# Patient Record
Sex: Female | Born: 1960
Health system: Southern US, Community
[De-identification: ages and names within clinical notes are randomized; demographics above are authoritative.]

## PROBLEM LIST (undated history)

## (undated) DIAGNOSIS — E079 Disorder of thyroid, unspecified: Secondary | ICD-10-CM

## (undated) DIAGNOSIS — C4490 Unspecified malignant neoplasm of skin, unspecified: Secondary | ICD-10-CM

## (undated) DIAGNOSIS — G4700 Insomnia, unspecified: Secondary | ICD-10-CM

## (undated) DIAGNOSIS — I10 Essential (primary) hypertension: Secondary | ICD-10-CM

## (undated) DIAGNOSIS — E039 Hypothyroidism, unspecified: Secondary | ICD-10-CM

## (undated) DIAGNOSIS — M797 Fibromyalgia: Secondary | ICD-10-CM

## (undated) DIAGNOSIS — K589 Irritable bowel syndrome without diarrhea: Secondary | ICD-10-CM

## (undated) DIAGNOSIS — F32A Depression, unspecified: Secondary | ICD-10-CM

## (undated) DIAGNOSIS — F329 Major depressive disorder, single episode, unspecified: Secondary | ICD-10-CM

## (undated) DIAGNOSIS — IMO0002 Reserved for concepts with insufficient information to code with codable children: Secondary | ICD-10-CM

## (undated) DIAGNOSIS — R29810 Facial weakness: Secondary | ICD-10-CM

## (undated) DIAGNOSIS — G2581 Restless legs syndrome: Secondary | ICD-10-CM

## (undated) DIAGNOSIS — N189 Chronic kidney disease, unspecified: Secondary | ICD-10-CM

## (undated) DIAGNOSIS — M329 Systemic lupus erythematosus, unspecified: Secondary | ICD-10-CM

## (undated) DIAGNOSIS — E785 Hyperlipidemia, unspecified: Secondary | ICD-10-CM

## (undated) HISTORY — DX: Chronic kidney disease, unspecified: N18.9

## (undated) HISTORY — DX: Essential (primary) hypertension: I10

## (undated) HISTORY — PX: ABDOMINAL HYSTERECTOMY: SHX81

## (undated) HISTORY — PX: OTHER SURGICAL HISTORY: SHX169

## (undated) HISTORY — DX: Facial weakness: R29.810

## (undated) HISTORY — DX: Hypothyroidism, unspecified: E03.9

## (undated) HISTORY — DX: Restless legs syndrome: G25.81

## (undated) HISTORY — DX: Insomnia, unspecified: G47.00

## (undated) HISTORY — DX: Hyperlipidemia, unspecified: E78.5

## (undated) HISTORY — PX: APPENDECTOMY: SHX54

## (undated) HISTORY — DX: Irritable bowel syndrome, unspecified: K58.9

---

## 1995-03-16 HISTORY — PX: OTHER SURGICAL HISTORY: SHX169

## 1996-02-13 HISTORY — PX: APPENDECTOMY: SHX54

## 1998-10-09 ENCOUNTER — Inpatient Hospital Stay (HOSPITAL_COMMUNITY): Admission: EM | Admit: 1998-10-09 | Discharge: 1998-10-12 | Payer: Self-pay | Admitting: Psychiatry

## 1998-10-09 ENCOUNTER — Emergency Department (HOSPITAL_COMMUNITY): Admission: EM | Admit: 1998-10-09 | Discharge: 1998-10-09 | Payer: Self-pay

## 1999-07-13 ENCOUNTER — Other Ambulatory Visit: Admission: RE | Admit: 1999-07-13 | Discharge: 1999-07-13 | Payer: Self-pay | Admitting: Obstetrics and Gynecology

## 1999-09-24 ENCOUNTER — Inpatient Hospital Stay (HOSPITAL_COMMUNITY): Admission: EM | Admit: 1999-09-24 | Discharge: 1999-09-26 | Payer: Self-pay | Admitting: Psychiatry

## 2000-07-18 ENCOUNTER — Other Ambulatory Visit: Admission: RE | Admit: 2000-07-18 | Discharge: 2000-07-18 | Payer: Self-pay | Admitting: Obstetrics and Gynecology

## 2001-11-19 ENCOUNTER — Other Ambulatory Visit: Admission: RE | Admit: 2001-11-19 | Discharge: 2001-11-19 | Payer: Self-pay | Admitting: Obstetrics and Gynecology

## 2003-01-04 ENCOUNTER — Other Ambulatory Visit: Admission: RE | Admit: 2003-01-04 | Discharge: 2003-01-04 | Payer: Self-pay | Admitting: Obstetrics and Gynecology

## 2004-03-14 ENCOUNTER — Other Ambulatory Visit: Admission: RE | Admit: 2004-03-14 | Discharge: 2004-03-14 | Payer: Self-pay | Admitting: Obstetrics and Gynecology

## 2004-09-28 ENCOUNTER — Encounter: Admission: RE | Admit: 2004-09-28 | Discharge: 2004-09-28 | Payer: Self-pay | Admitting: Obstetrics and Gynecology

## 2005-05-29 ENCOUNTER — Encounter: Admission: RE | Admit: 2005-05-29 | Discharge: 2005-05-29 | Payer: Self-pay | Admitting: Obstetrics and Gynecology

## 2006-02-12 HISTORY — PX: ABDOMINAL HYSTERECTOMY: SHX81

## 2006-03-19 ENCOUNTER — Encounter: Admission: RE | Admit: 2006-03-19 | Discharge: 2006-03-19 | Payer: Self-pay | Admitting: Obstetrics and Gynecology

## 2006-10-11 ENCOUNTER — Ambulatory Visit (HOSPITAL_COMMUNITY): Admission: RE | Admit: 2006-10-11 | Discharge: 2006-10-11 | Payer: Self-pay | Admitting: Family Medicine

## 2006-10-11 IMAGING — US US TRANSVAGINAL NON-OB
1 series · 14 of 25 positions shown · non-contrast
Comparison: None.

CLINICAL DATA: Left ovarian cyst, pelvic pain. 
 TRANSABDOMINAL AND TRANSVAGINAL PELVIC ULTRASOUND:
TECHNIQUE: Both transabdominal and transvaginal ultrasound examinations of the pelvis were performed including evaluation of the uterus, ovaries, adnexal regions, and pelvic cul-de-sac.

[Series 1: us transvaginal non-ob · 0.26mm/px · 62 acquisitions, 14 frames shown]
[im 1/62]
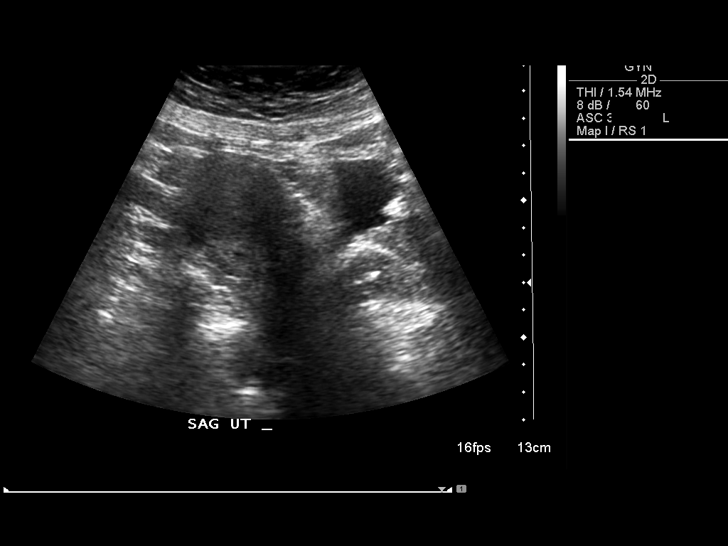
[im 6/62]
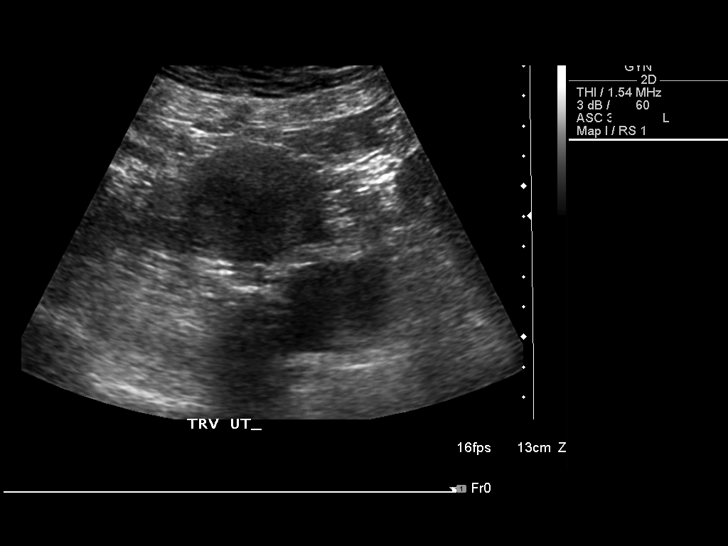
[im 11/62]
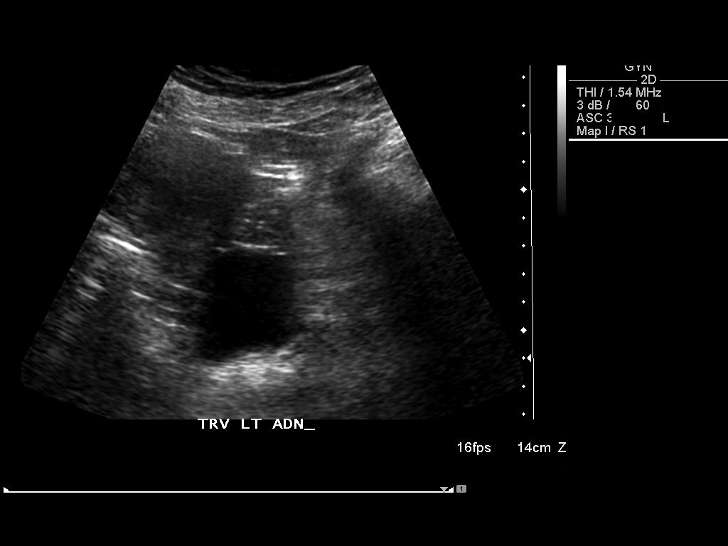
[im 16/62]
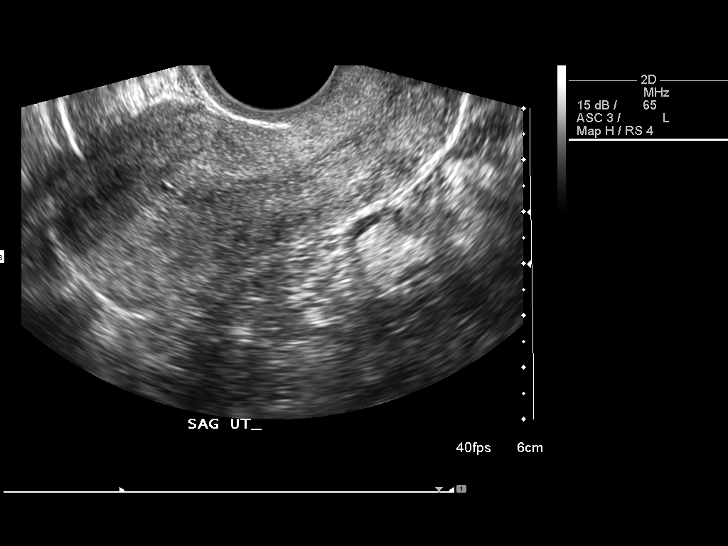
[im 21/62]
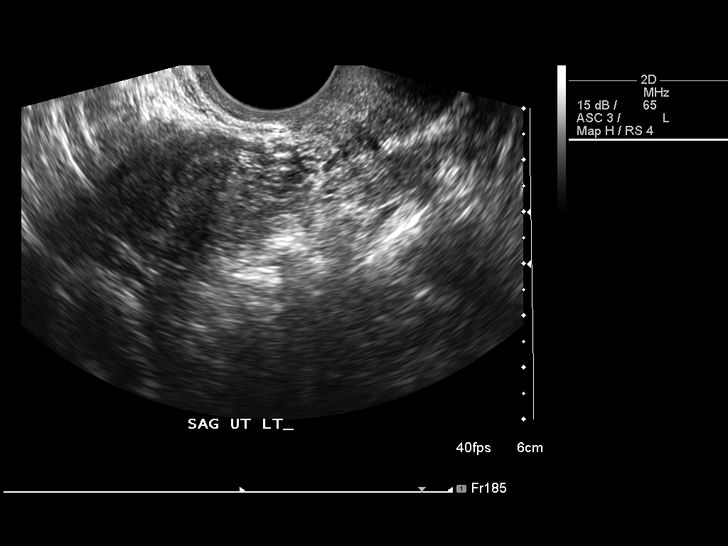
[im 23/62]
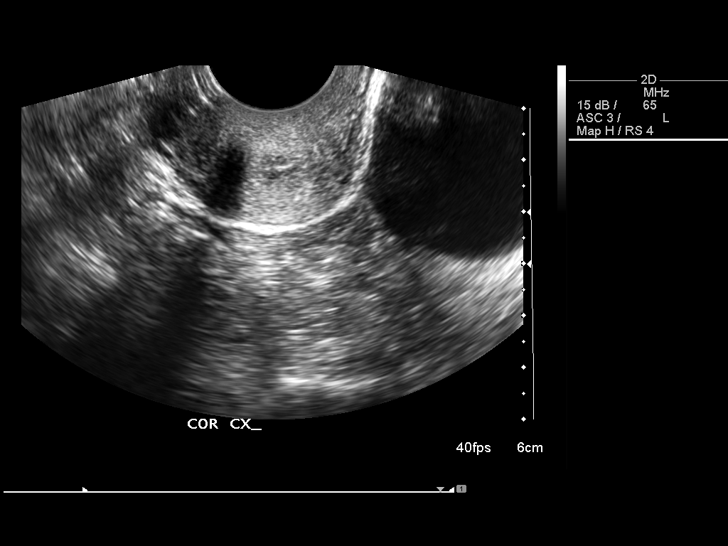
[im 28/62]
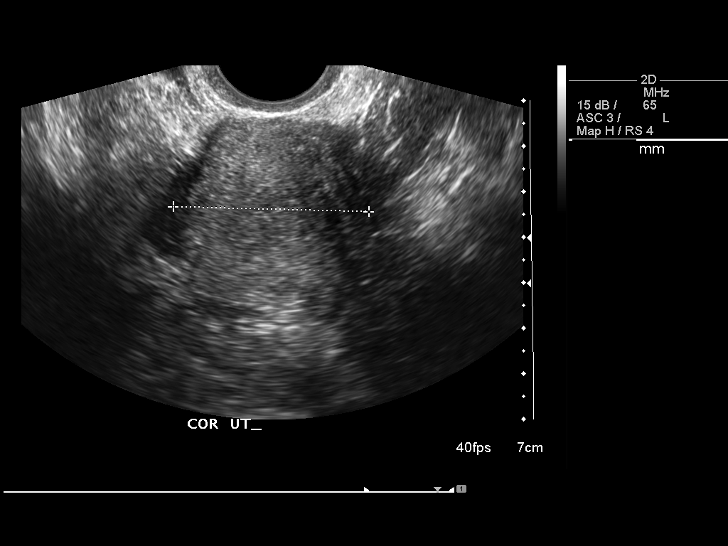
[im 34/62]
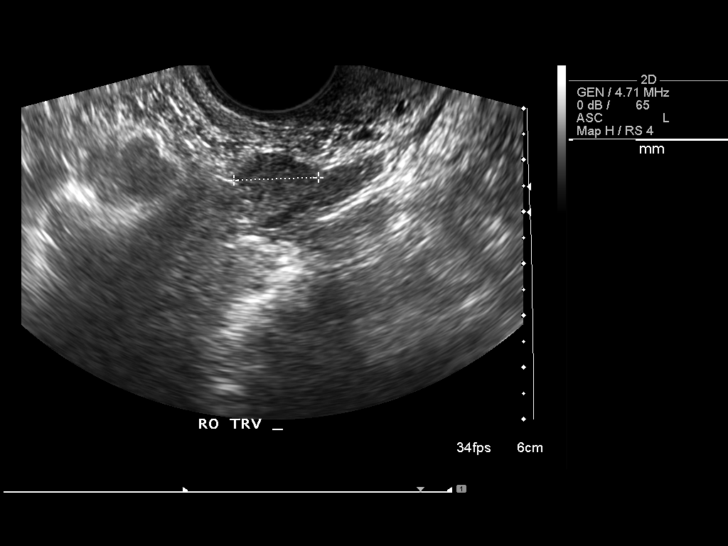
[im 39/62]
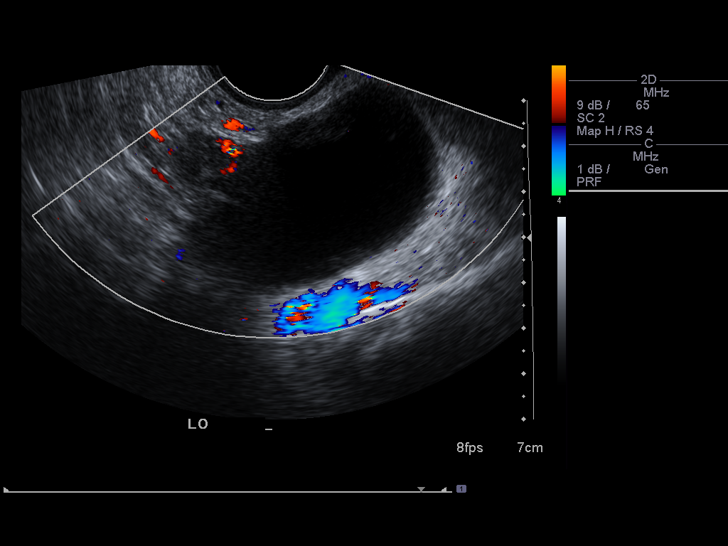
[im 41/62]
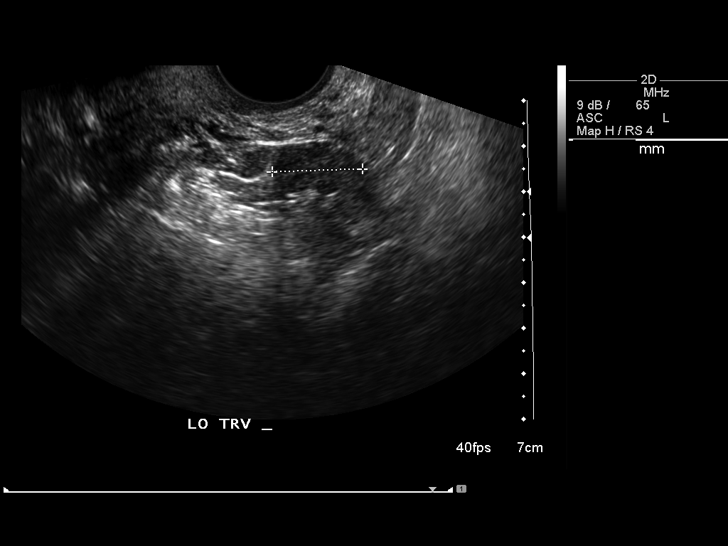
[im 46/62]
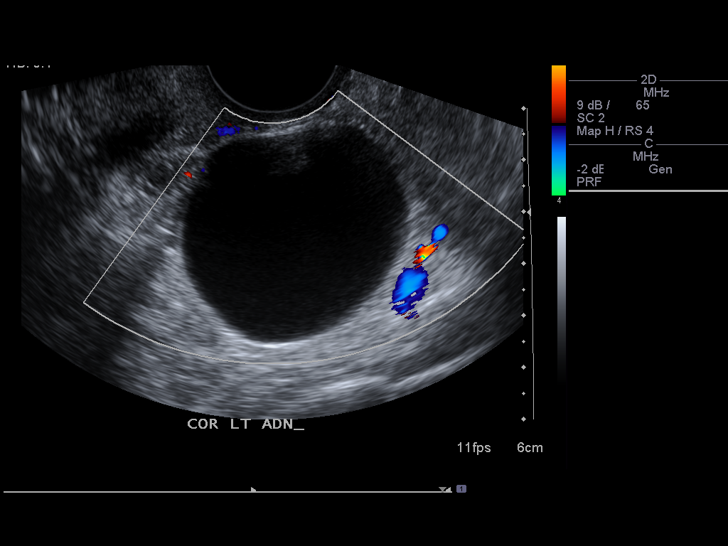
[im 51/62]
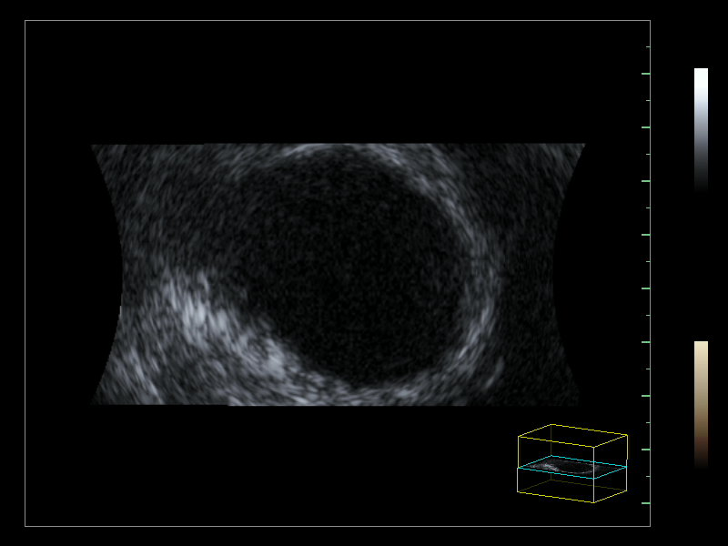
[im 56/62]
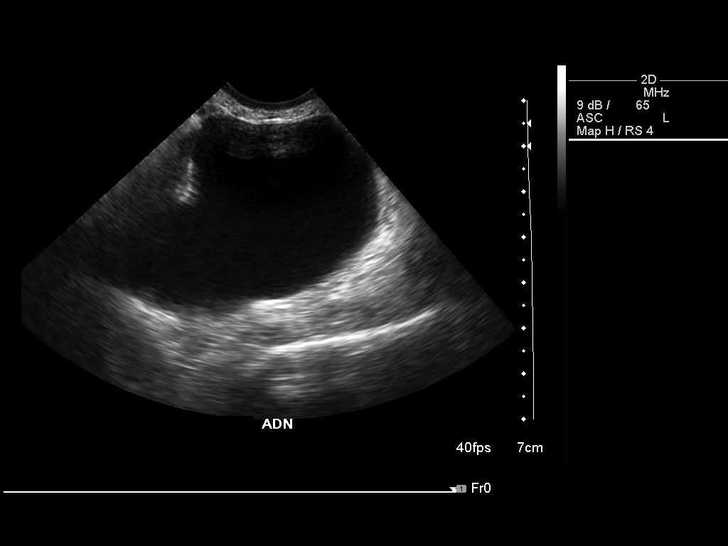
[im 62/62]
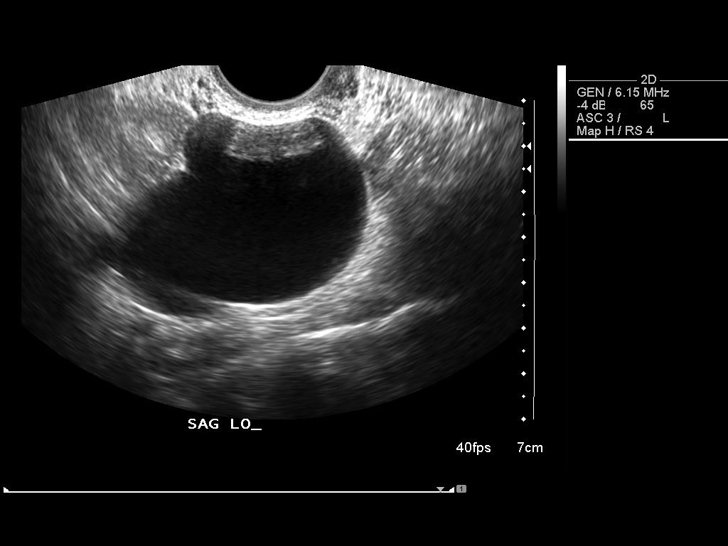

[14 of 25 positions shown; findings below may reference images not displayed]

FINDINGS: The uterus is unremarkable. Endometrial stripe measures 7 mm and is uniformly echogenic. Right ovary is normal. The left ovary contains a simple-appearing cyst measuring 5.5 x 4.4 x 3.8 cm. No free fluid.
IMPRESSION: Simple-appearing left ovarian cyst.  By size criteria, this cyst could predispose to ovarian torsion. Followup is suggested if resection is not planned.

## 2006-11-19 ENCOUNTER — Encounter (INDEPENDENT_AMBULATORY_CARE_PROVIDER_SITE_OTHER): Payer: Self-pay | Admitting: Obstetrics and Gynecology

## 2006-11-20 ENCOUNTER — Inpatient Hospital Stay (HOSPITAL_COMMUNITY): Admission: RE | Admit: 2006-11-20 | Discharge: 2006-11-21 | Payer: Self-pay | Admitting: Obstetrics and Gynecology

## 2007-04-08 ENCOUNTER — Encounter: Admission: RE | Admit: 2007-04-08 | Discharge: 2007-04-08 | Payer: Self-pay | Admitting: Obstetrics and Gynecology

## 2008-05-03 ENCOUNTER — Ambulatory Visit (HOSPITAL_COMMUNITY): Admission: RE | Admit: 2008-05-03 | Discharge: 2008-05-03 | Payer: Self-pay | Admitting: Obstetrics and Gynecology

## 2008-05-03 IMAGING — CT CT ABDOMEN W/ CM
3 of 5 series · 15 of 32 positions shown, 19 images · IV contrast (40ML OMNI-MIX & [ID] OMNIP 300%)
Comparison: None

CT ABDOMEN

CLINICAL DATA: Severe right lower quadrant pain and pelvic pain.
Previous vaginal hysterectomy.

CT ABDOMEN AND PELVIS WITH CONTRAST
TECHNIQUE: Multidetector CT imaging of the abdomen and pelvis was
performed using the standard protocol following bolus
administration of intravenous contrast.
Contrast: 100 ml [0P] and oral contrast

[Series 2: abd pelvis · axial · 0.69mm/px · z∈[-415,-195]mm · 3 of 87 slices shown, 7 images]
[im 22/87  soft-tissue]
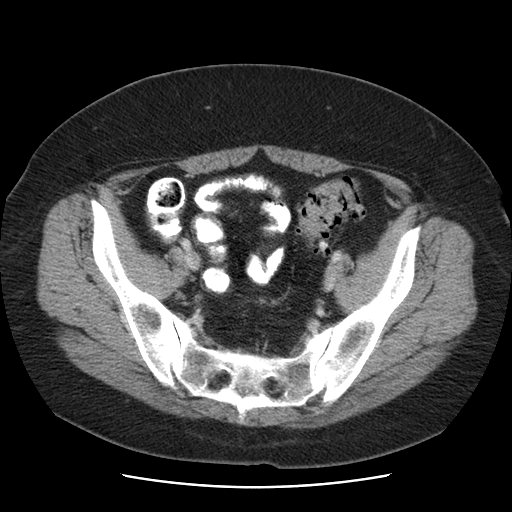
[im 22/87  lung]
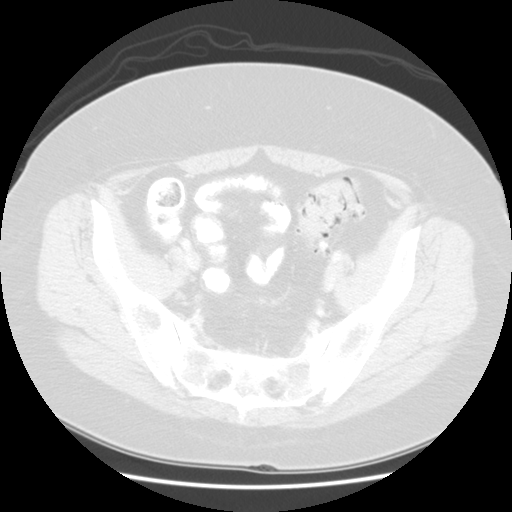
[im 22/87  bone]
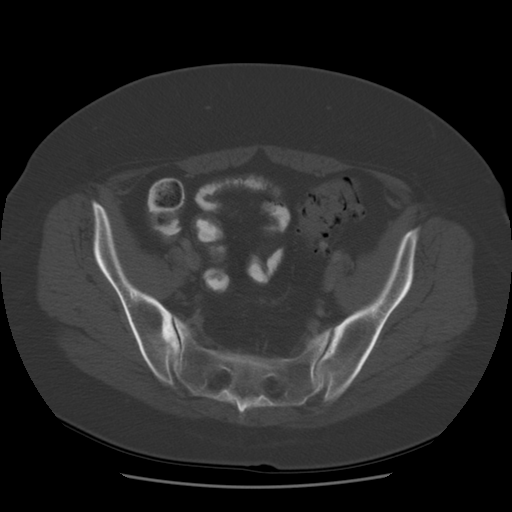
[im 44/87  soft-tissue]
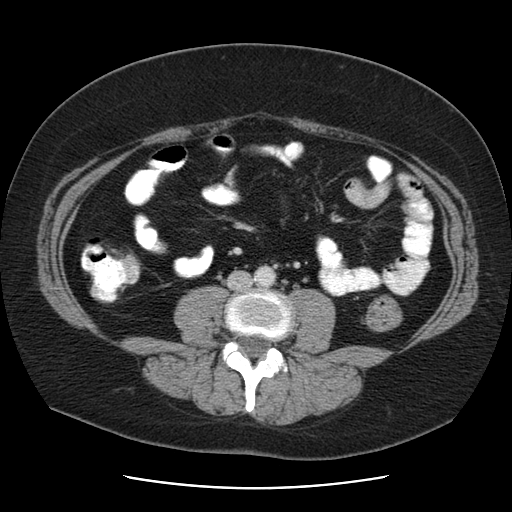
[im 44/87  lung]
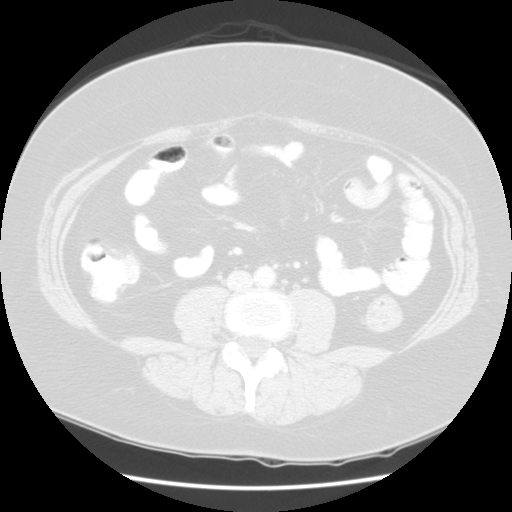
[im 65/87  soft-tissue]
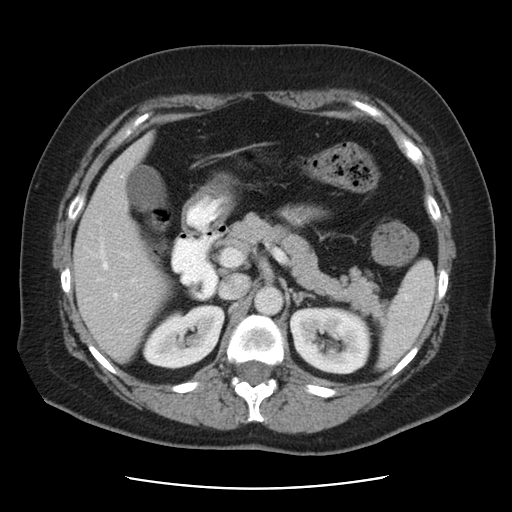
[im 65/87  lung]
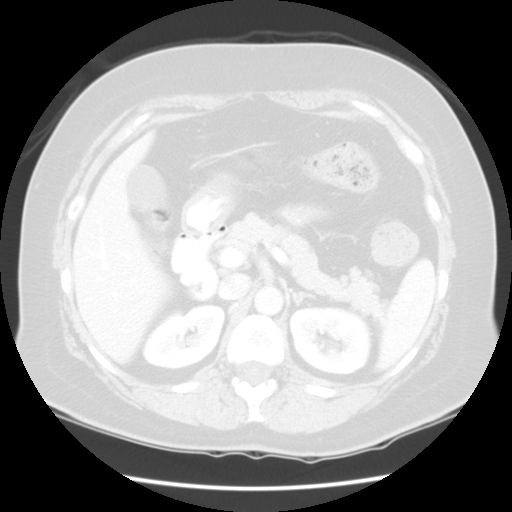

[Series 400: reformatted · coronal · 0.90mm/px · 4 of 164 slices shown (1 of 2)]
[im 17/164  soft-tissue]
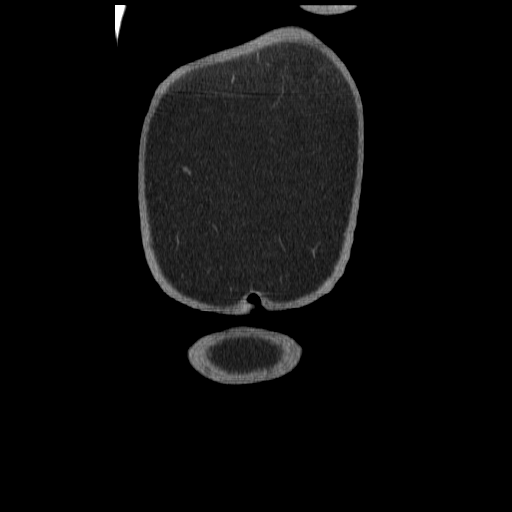
[im 33/164  soft-tissue]
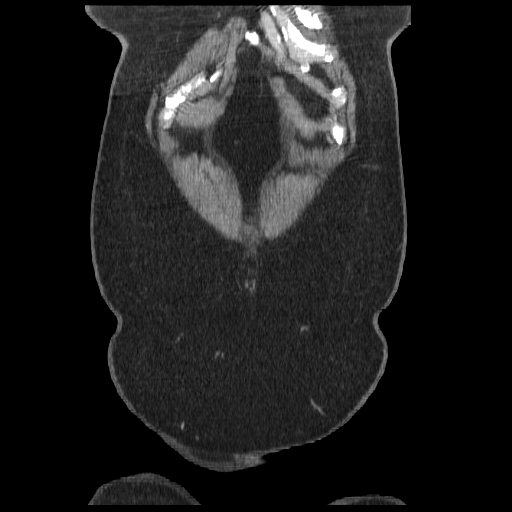
[im 49/164  soft-tissue]
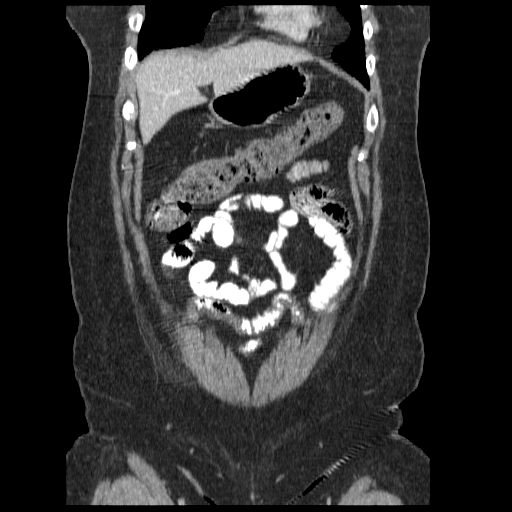
[im 66/164  soft-tissue]
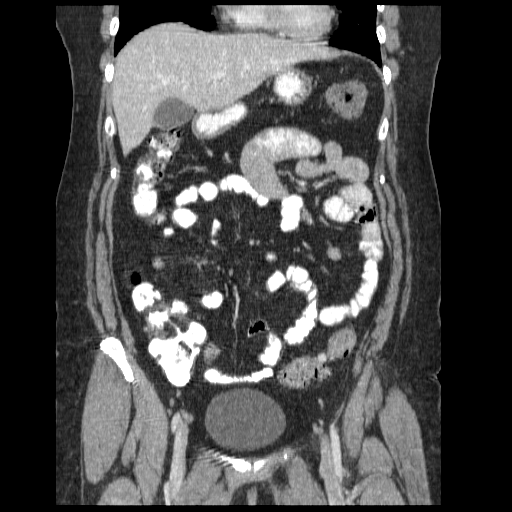

[Series 401: reformatted · sagittal · 0.90mm/px · 8 of 173 slices shown (2 of 2)]
[im 16/173  soft-tissue]
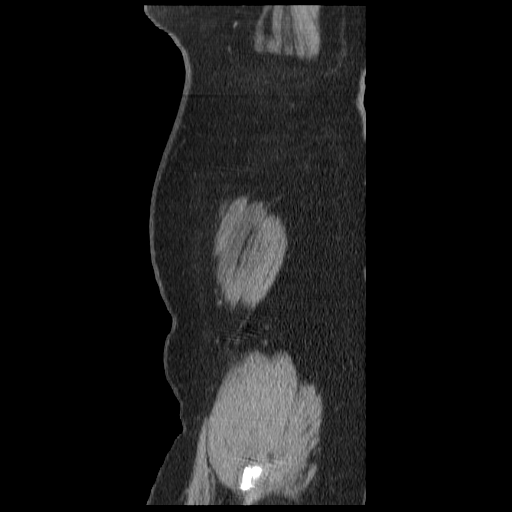
[im 32/173  soft-tissue]
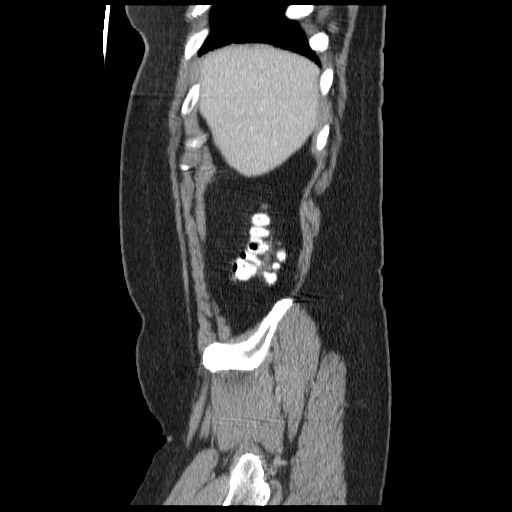
[im 63/173  soft-tissue]
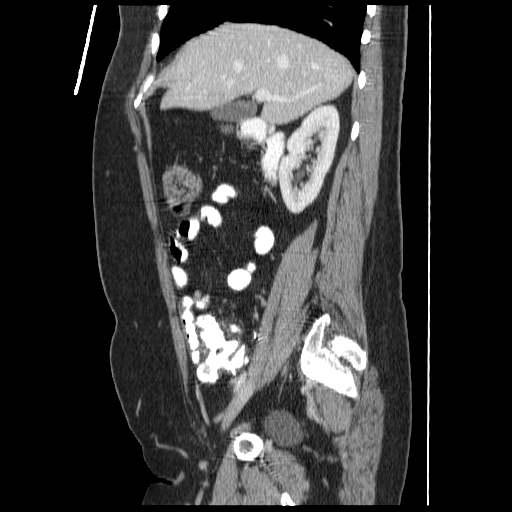
[im 79/173  soft-tissue]
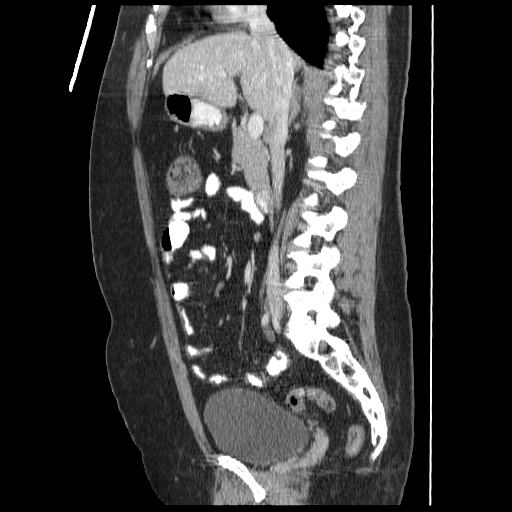
[im 94/173  soft-tissue]
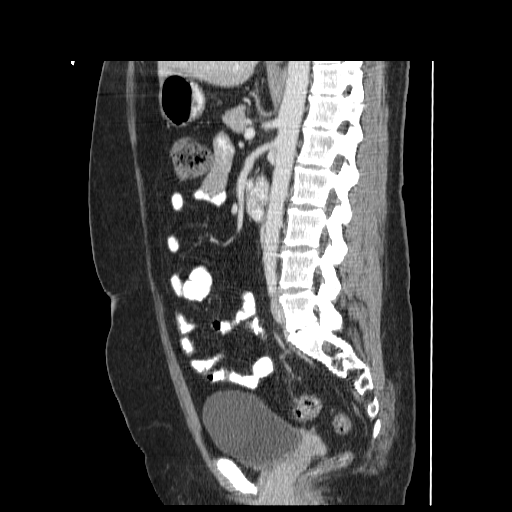
[im 110/173  soft-tissue]
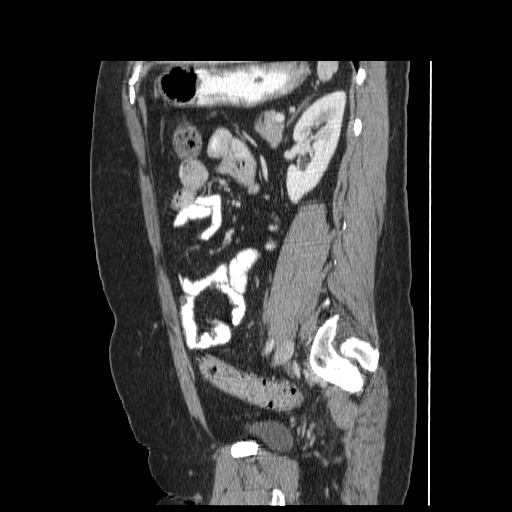
[im 141/173  soft-tissue]
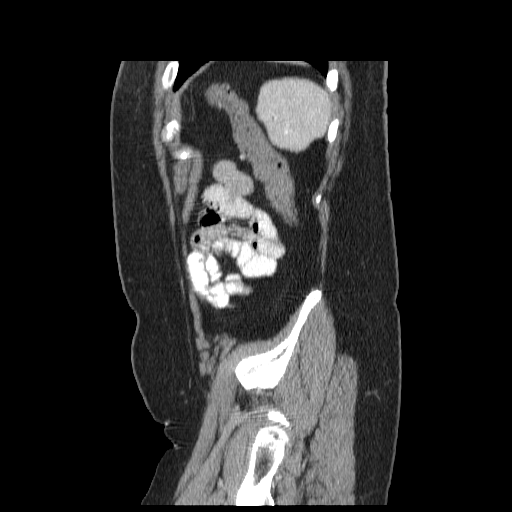
[im 157/173  soft-tissue]
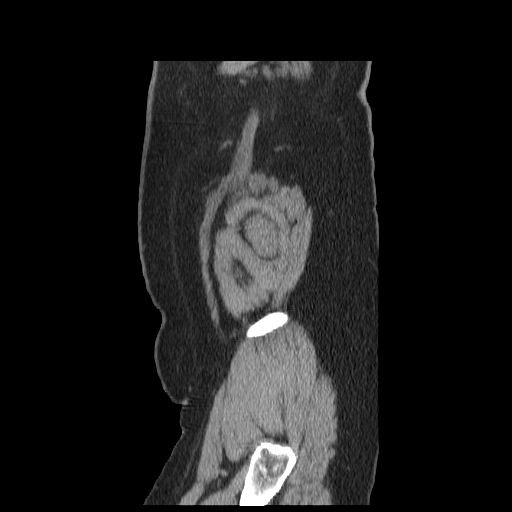

[15 of 32 positions shown; findings below may reference images not displayed]

FINDINGS: The abdominal parenchymal organs are normal appearance.
Gallbladder is unremarkable, and there is no evidence of
hydronephrosis.

No soft tissue masses or adenopathy identified.  There is no
evidence of inflammatory process or abnormal fluid collections
within the abdomen.  Abdominal bowel loops are unremarkable in
appearance.
IMPRESSION: Negative abdomen CT.

CT PELVIS
FINDINGS: Prior hysterectomy noted.  Surgical clips also seen in
the right lower quadrant, likely from prior appendectomy.

Diverticulosis of the sigmoid colon and descending colon is
demonstrated, however there is no evidence of diverticulitis.  No
other inflammatory process or abnormal fluid collections are
identified.  There is no evidence of pelvic soft tissue masses or
lymphadenopathy.

There is no evidence of ureteral calculi or dilatation.  Urinary
bladder is unremarkable in appearance.
IMPRESSION: 1.  No acute findings.
2.  Diverticulosis of the descending and sigmoid colon.  No
evidence of diverticulitis.

## 2009-06-10 ENCOUNTER — Inpatient Hospital Stay (HOSPITAL_COMMUNITY): Admission: RE | Admit: 2009-06-10 | Discharge: 2009-06-14 | Payer: Self-pay | Admitting: Internal Medicine

## 2009-06-10 ENCOUNTER — Encounter: Payer: Self-pay | Admitting: Emergency Medicine

## 2009-06-10 IMAGING — CR DG CHEST 1V PORT
1 series · 1 of 1 positions shown · non-contrast
Comparison: None

CLINICAL DATA: Overdose

PORTABLE CHEST - 1 VIEW

[view not recorded]
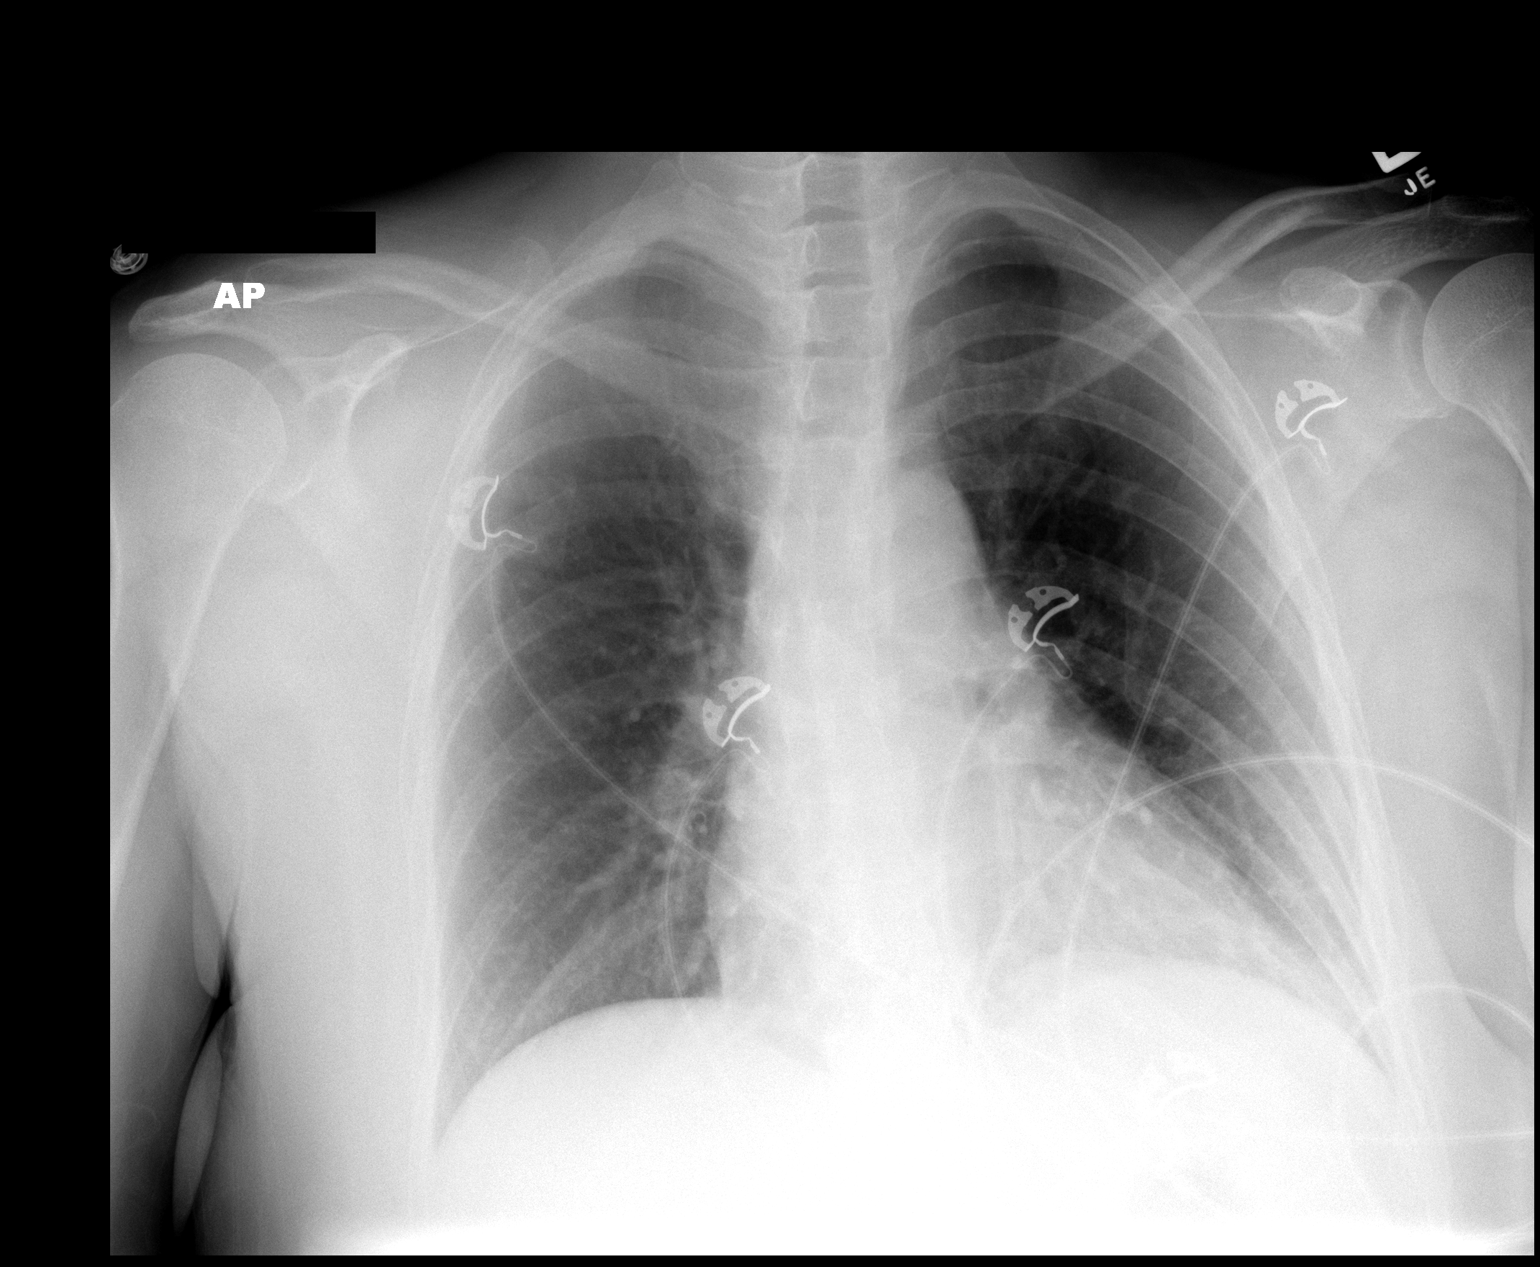

[1 of 1 positions shown; findings below may reference images not displayed]

FINDINGS: The lungs are clear.  The heart is within upper limits of
normal.  No acute bony abnormality is seen.
IMPRESSION: No active lung disease.

## 2009-06-14 ENCOUNTER — Ambulatory Visit: Payer: Self-pay | Admitting: Psychiatry

## 2009-06-14 ENCOUNTER — Inpatient Hospital Stay (HOSPITAL_COMMUNITY): Admission: EM | Admit: 2009-06-14 | Discharge: 2009-06-15 | Payer: Self-pay | Admitting: Psychiatry

## 2010-02-24 ENCOUNTER — Encounter
Admission: RE | Admit: 2010-02-24 | Discharge: 2010-02-24 | Payer: Self-pay | Source: Home / Self Care | Attending: Family Medicine | Admitting: Family Medicine

## 2010-02-24 IMAGING — CT CT ABD-PELV W/ CM
2 of 5 series · 13 of 32 positions shown, 18 images · IV contrast (READICAT/WATER & [ID] OMNI 300)
Comparison: [DATE].

CLINICAL DATA: Left lower quadrant pain.  Question diverticulitis?
History confirm with Dr. LUTFI.

CT ABDOMEN AND PELVIS WITH CONTRAST
TECHNIQUE: Multidetector CT imaging of the abdomen and pelvis was
performed following the standard protocol during bolus
administration of intravenous contrast.
Contrast: 100 ml [MQ].

[Series 2: abdomen w/ · axial · 0.78mm/px · z∈[-425,-105]mm · 5 of 96 slices shown, 10 images]
[im 16/96  soft-tissue]
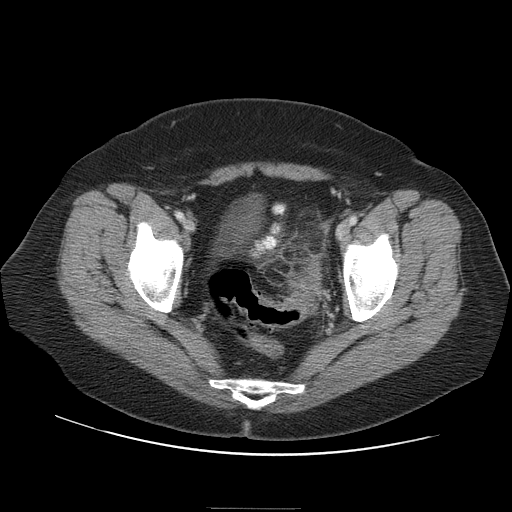
[im 16/96  bone]
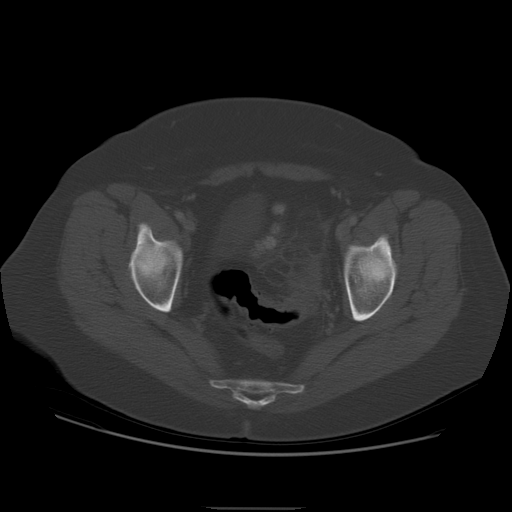
[im 32/96  soft-tissue]
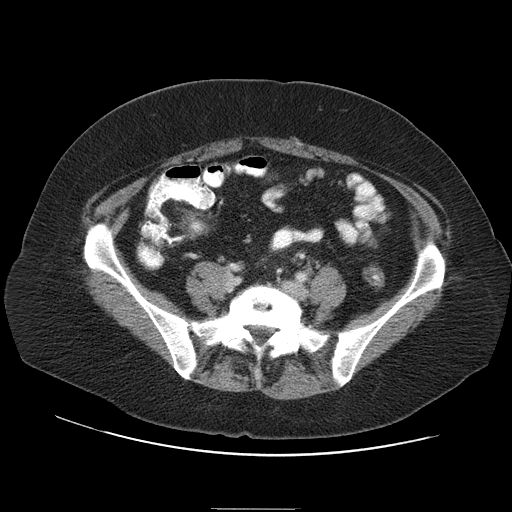
[im 32/96  lung]
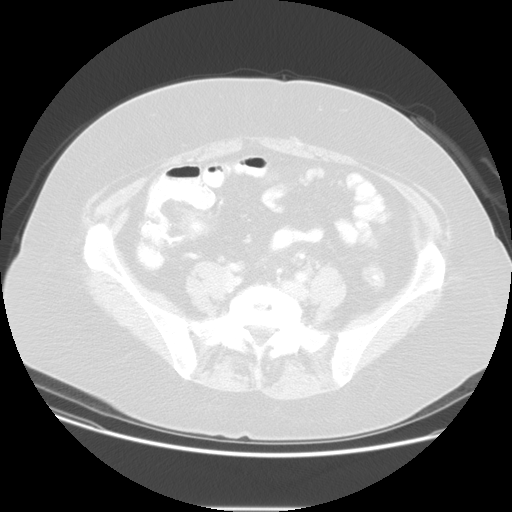
[im 48/96  soft-tissue]
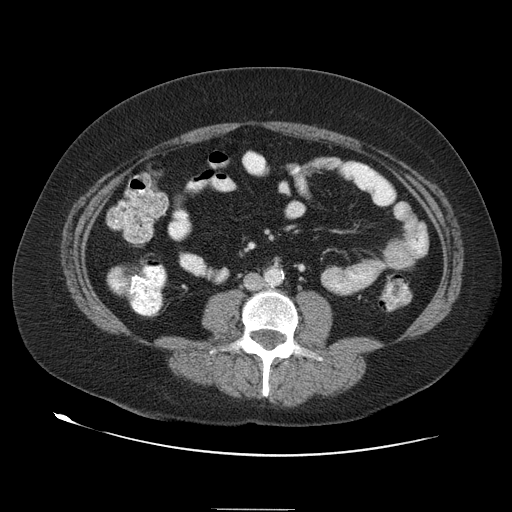
[im 48/96  lung]
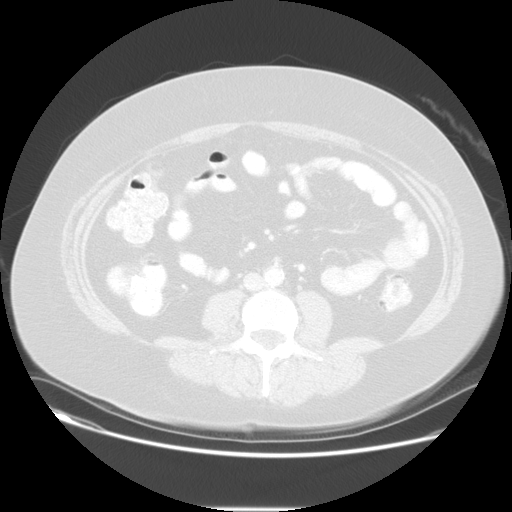
[im 64/96  soft-tissue]
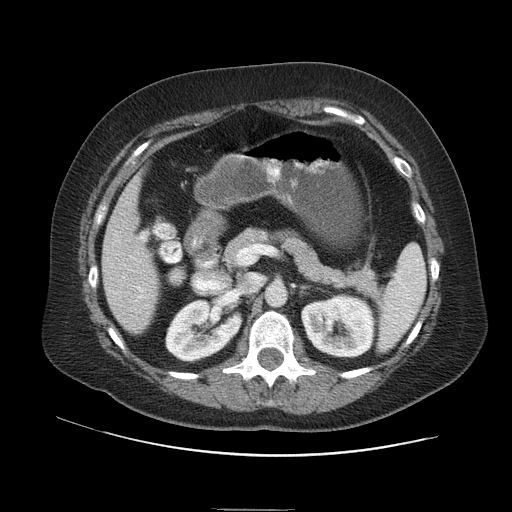
[im 64/96  lung]
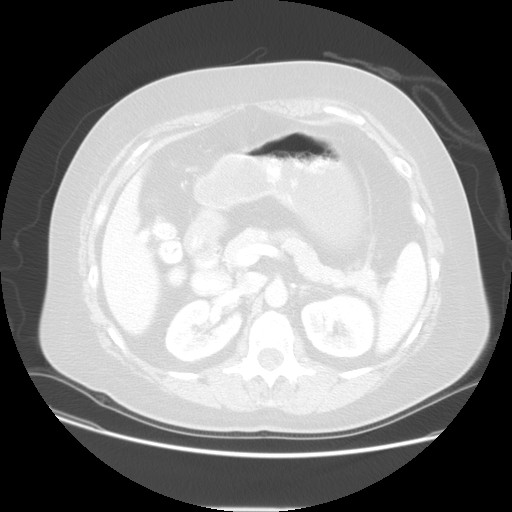
[im 80/96  soft-tissue]
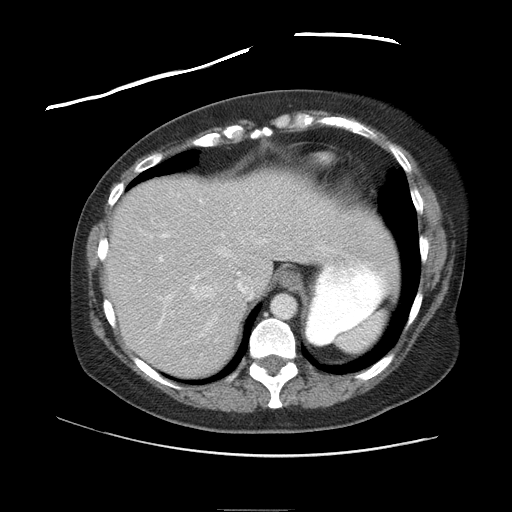
[im 80/96  lung]
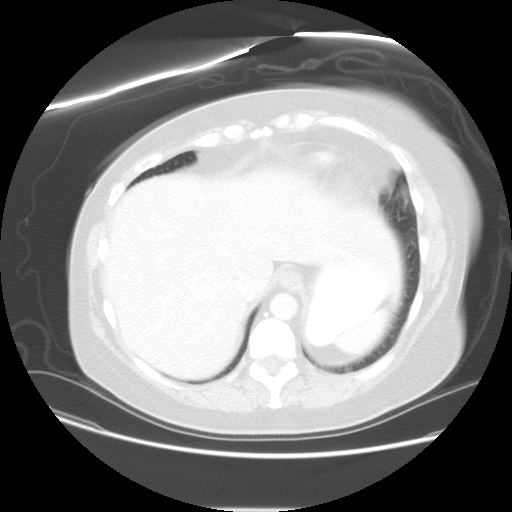

[Series 400: sagittal · sagittal · 0.98mm/px · 8 of 150 slices shown]
[im 14/150  soft-tissue]
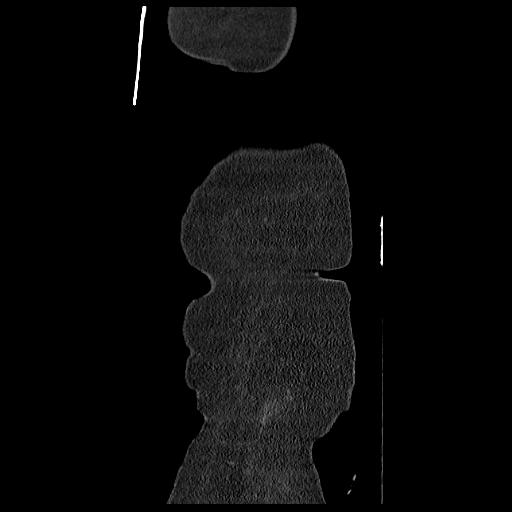
[im 28/150  soft-tissue]
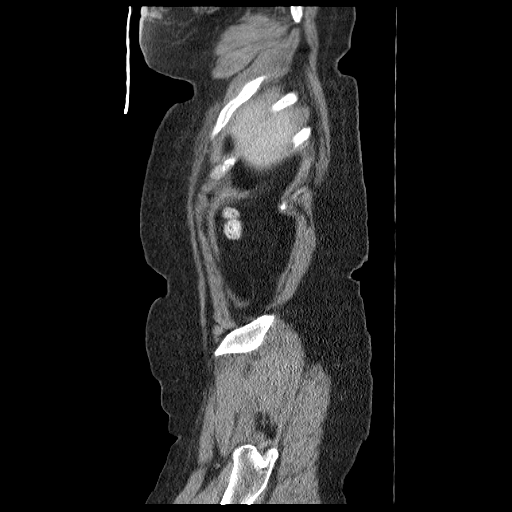
[im 55/150  soft-tissue]
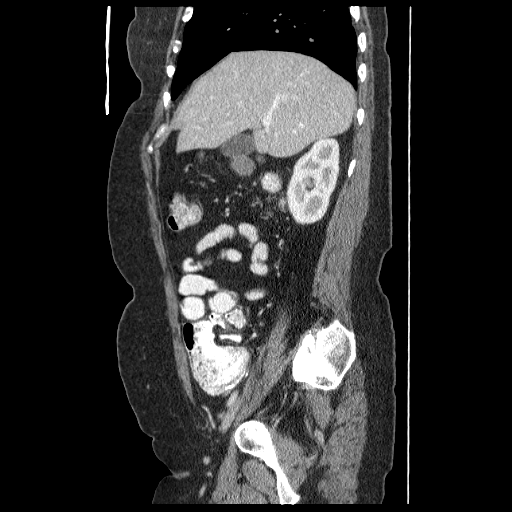
[im 68/150  soft-tissue]
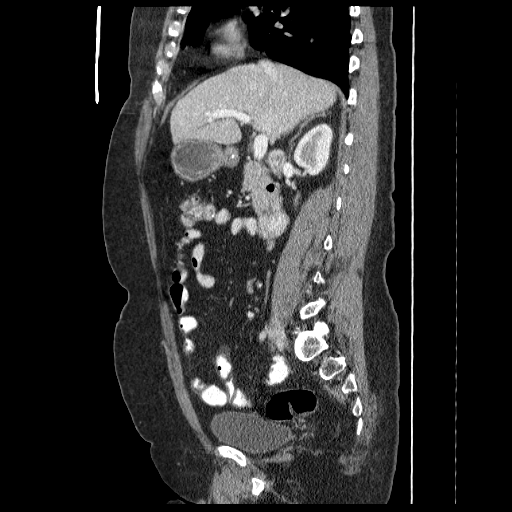
[im 82/150  soft-tissue]
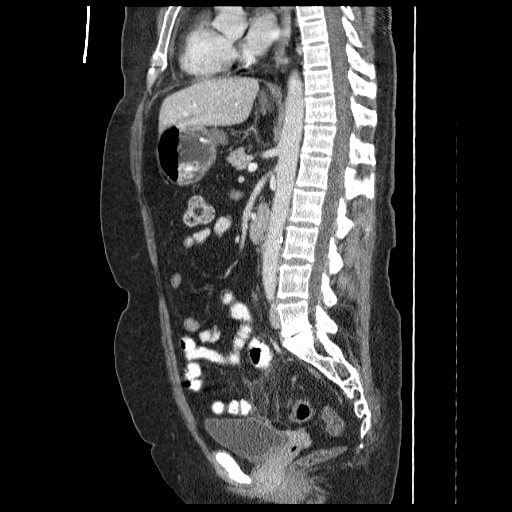
[im 95/150  soft-tissue]
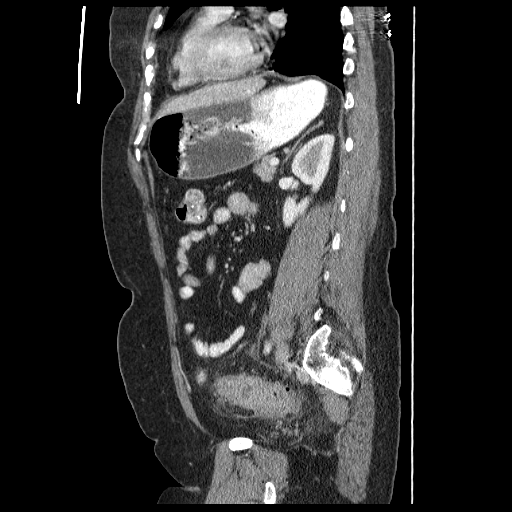
[im 122/150  soft-tissue]
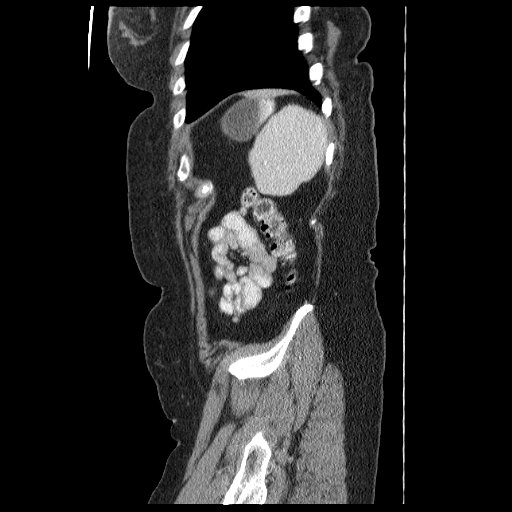
[im 136/150  soft-tissue]
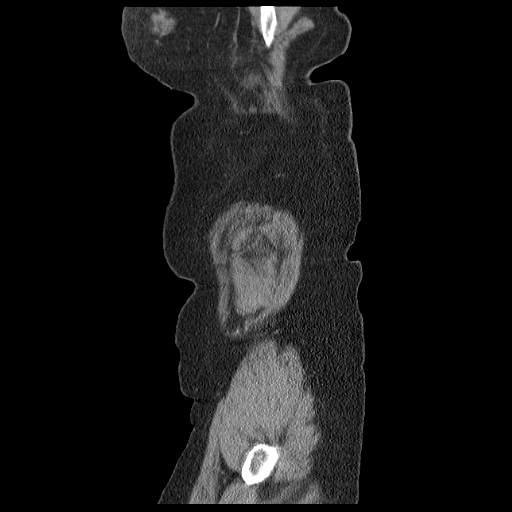

[13 of 32 positions shown; findings below may reference images not displayed]

FINDINGS: Small calcified granuloma inferior aspect lingula and the
left lower lobe.

Coronary artery calcifications.  Calcification lower abdominal
aorta and bifurcation.  Calcification iliac arteries.  No abdominal
aortic aneurysm.

Sigmoid diverticulitis with focal abscesses contained within the
wall of the sigmoid colon.  Surrounding inflammation without
extraluminal drainable abscess.  Recommend evaluation of colon
after acute episode has cleared to exclude malignancy.  No
surrounding adenopathy to suggest malignancy.

Scattered diverticula throughout the descending colon without
inflammation.  Prior appendectomy.  Small  duodenal diverticulum.

Mild asymmetry of breast tissue.  Correlation with mammography
recommended.

No focal liver, splenic, pancreatic, adrenal or renal lesion.  No
calcified gallstones.

Post hysterectomy.  Urinary bladder without focal abnormality.  L5-
S1 degenerative changes.  No bony destructive lesion.
IMPRESSION: Sigmoid diverticulitis without drainable abscess.

Mild atherosclerotic type changes coronary arteries and aortic
bifurcation.

Critical test results telephoned to Dr. LUTFI at the time of
interpretation on [DATE] at [DATE] p.m.

## 2010-02-27 ENCOUNTER — Ambulatory Visit (HOSPITAL_COMMUNITY): Admission: RE | Admit: 2010-02-27 | Payer: Self-pay | Source: Home / Self Care | Admitting: Family Medicine

## 2010-03-05 ENCOUNTER — Encounter: Payer: Self-pay | Admitting: Obstetrics and Gynecology

## 2010-03-15 ENCOUNTER — Encounter: Payer: Self-pay | Admitting: Family Medicine

## 2010-05-02 LAB — CBC
HCT: 44.2 % (ref 36.0–46.0)
Hemoglobin: 14.9 g/dL (ref 12.0–15.0)
MCHC: 33 g/dL (ref 30.0–36.0)
MCV: 84.5 fL (ref 78.0–100.0)
MCV: 84.8 fL (ref 78.0–100.0)
RBC: 4.7 MIL/uL (ref 3.87–5.11)
WBC: 7.6 10*3/uL (ref 4.0–10.5)
WBC: 8.1 10*3/uL (ref 4.0–10.5)

## 2010-05-02 LAB — DIFFERENTIAL
Basophils Relative: 0 % (ref 0–1)
Eosinophils Absolute: 0.1 10*3/uL (ref 0.0–0.7)
Eosinophils Relative: 1 % (ref 0–5)
Monocytes Absolute: 0.3 10*3/uL (ref 0.1–1.0)
Monocytes Relative: 4 % (ref 3–12)
Neutro Abs: 5.4 10*3/uL (ref 1.7–7.7)
Neutrophils Relative %: 71 % (ref 43–77)

## 2010-05-02 LAB — COMPREHENSIVE METABOLIC PANEL
AST: 20 U/L (ref 0–37)
Albumin: 4.1 g/dL (ref 3.5–5.2)
Alkaline Phosphatase: 82 U/L (ref 39–117)
Calcium: 9.4 mg/dL (ref 8.4–10.5)
Creatinine, Ser: 0.87 mg/dL (ref 0.4–1.2)
GFR calc Af Amer: >60 mL/min (ref >60)
GFR calc non Af Amer: >60 mL/min (ref >60)
Glucose, Bld: 93 mg/dL (ref 70–99)
Potassium: 4 mEq/L (ref 3.5–5.1)
Sodium: 137 mEq/L (ref 135–145)
Total Protein: 7.5 g/dL (ref 6.0–8.3)

## 2010-05-02 LAB — URINALYSIS, ROUTINE W REFLEX MICROSCOPIC
Ketones, ur: NEGATIVE mg/dL
Specific Gravity, Urine: 1.012 (ref 1.005–1.030)
Urobilinogen, UA: 0.2 mg/dL (ref 0.0–1.0)

## 2010-05-02 LAB — BASIC METABOLIC PANEL
GFR calc non Af Amer: 57 mL/min — ABNORMAL LOW (ref >60)
Sodium: 141 mEq/L (ref 135–145)

## 2010-05-02 LAB — RAPID URINE DRUG SCREEN, HOSP PERFORMED: Opiates: NOT DETECTED

## 2010-05-02 LAB — MRSA CULTURE

## 2010-05-02 LAB — URINE CULTURE

## 2010-05-02 LAB — URINE MICROSCOPIC-ADD ON

## 2010-05-02 LAB — ACETAMINOPHEN LEVEL: Acetaminophen (Tylenol), Serum: <10 ug/mL — ABNORMAL LOW (ref 10–30)

## 2010-05-02 LAB — SALICYLATE LEVEL: Salicylate Lvl: <4 mg/dL (ref 2.8–20.0)

## 2010-06-27 NOTE — Discharge Summary (Signed)
Kathleen Sweeney, Kathleen Sweeney              ACCOUNT NO.:  [id number]   MEDICAL RECORD NO.:  [id number]          PATIENT TYPE:  OIB   LOCATION:  9311                          FACILITY:  [facility]   PHYSICIAN:  Duke Salvia. Marcelle Overlie, M.D.DATE OF BIRTH:  1961-01-26   DATE OF ADMISSION:  11/19/2006  DATE OF DISCHARGE:  11/20/2006                               DISCHARGE SUMMARY   DISCHARGE DIAGNOSES:  1. Pelvic pain.  2. Ovarian cyst.  3. LABH-BSO this admission.   SUMMARY OF THE HISTORY AND PHYSICAL:  Please see admission H&P for  details.  Briefly, a 50 year old with persistent pelvic pain who has had  a noted 4-5 cm simple persistent cyst on the left adnexa presents for  definitive hysterectomy with BSO.   HOSPITAL COURSE:  On November 19, 2006 under general anesthesia the  patient underwent LABH-BSO.  There was noted to be a left paraovarian,  thin walled simple cyst.  On the first postoperative day she was  afebrile, she tolerated a regular diet, her abdominal exam was  unremarkable and she was ready to be discharged after voiding.   LABORATORY DATA:  Preop hemoglobin 14.9. postop on November 20, 2006 was  11 with a WBC of 7000.   DISPOSITION:  The patient discharged on Tylox p.r.n. pain.  Return to  the office in 1 week.  Advised to report any incisional redness or  drainage, increased pain or bleeding or fever over 101.  She was given  specific instructions regarding diet, sex, and exercise.   CONDITION:  Good.   ACTIVITY:  Graded increase.      Richard M. Marcelle Overlie, M.D.  Electronically Signed     RMH/MEDQ  D:  11/20/2006  T:  11/20/2006  Job:  [id number]

## 2010-06-27 NOTE — H&P (Signed)
NAMEKAYDIE, PETSCH              ACCOUNT NO.:  [id number]   MEDICAL RECORD NO.:  [id number]          PATIENT TYPE:  AMB   LOCATION:  SDC                           [facility]ACILITY:  [facility]   PHYSICIAN:  Duke Salvia. Marcelle Overlie, M.D.DATE OF BIRTH:  18-Oct-1960   DATE OF ADMISSION:  11/19/2006  DATE OF DISCHARGE:                              HISTORY & PHYSICAL   CHIEF COMPLAINT:  Pelvic pain, ovarian cyst.   HISTORY OF PRESENT ILLNESS:  A 50 year old G2, P2.  The patient is  postmenopausal.  She did have pelvic pain and a noted ovarian cyst in  2006 and was actually scheduled for hysterectomy, but her periods  actually spaced out and stopped.  Her FSH was checked March 2006 and  noted to be 64.  Her pelvic pain improved somewhat and she decided to  try HRT and not go through with hysterectomy at that time.   More recently, she has had worsening problems with pelvic pain.  Ultrasound was done a [facility] to evaluate; that was done August  2008; that showed a 5 x 4 x 3-cm simple cyst on the left with a followup  CA125 that was normal.  She has required narcotics for pain relief and  would prefer to go ahead at this time and proceed with LAVH/BSO.  The  procedure including risks of bleeding, infection, adjacent organ injury,  possible need for open or additional surgery, other issues related to  ERT, expected recovery time all discussed, which she understands and  accepts.   ALLERGIES:  PENICILLIN and ERYTHROMYCIN.   CURRENT MEDICATIONS:  Levothroid, Prozac and Crestor.   PAST MEDICAL HISTORY:  1. Two vaginal deliveries at term.  2. She has had a prior appendectomy.  3. A benign spinal tumor removed in 1997.   FAMILY HISTORY:  Significant for father with hypertension.   PHYSICAL EXAM:  VITAL SIGNS:  Temperature 98.2, blood pressure 120/78.  HEENT:  Unremarkable.  NECK:  Supple without masses.  LUNGS:  Clear.  CARDIOVASCULAR:  Regular rate and rhythm without murmurs, rubs or  gallops  noted.  BREASTS:  Without masses.  ABDOMEN:  Soft, flat and nontender.  PELVIC:  Normal external genitalia.  Vagina and cervix clear.  Fullness  in the left adnexa, mildly tender.  No unusual nodularity.  Right adnexa  unremarkable.  EXTREMITIES:  Unremarkable.  NEUROLOGIC:  Unremarkable.   IMPRESSION:  1. Pelvic pain.  2. Ovarian cyst.   PLAN:  LAVH/BSO.  Procedure and risks reviewed as above.      Richard M. Marcelle Overlie, M.D.  Electronically Signed     RMH/MEDQ  D:  11/13/2006  T:  11/14/2006  Job:  [id number]

## 2010-06-27 NOTE — Op Note (Signed)
[name], Sweeney              ACCOUNT NO.:  [id number]   MEDICAL RECORD NO.:  [id number]          PATIENT TYPE:  OIB   LOCATION:  9311                          FACILITY:  [facility]   PHYSICIAN:  Kathleen Salvia. Kathleen Sweeney, M.D.DATE OF BIRTH:  [birth year]   DATE OF PROCEDURE:  11/19/2006  DATE OF DISCHARGE:                               OPERATIVE REPORT   PREOPERATIVE DIAGNOSES:  1. Acute and chronic pelvic pain.  2. Left ovarian cyst.   PROCEDURE:  Diagnostic laparoscopy with laparoscopic-assisted vaginal  hysterectomy and bilateral salpingo-oophorectomy.   SURGEON:  Kathleen Salvia. Kathleen Sweeney, M.D.   ANESTHESIA:  General.   COMPLICATIONS:  None.   SPECIMENS REMOVED:  Uterus, bilateral tubes and ovaries.   ESTIMATED BLOOD LOSS:  300.   DRAINS:  Foley catheter.   PROCEDURE AND FINDINGS:  The patient was taken to the operating room.  After an adequate level of general endotracheal anesthesia was obtained,  with the patient's legs in stirrups, the abdomen, perineum and vagina  prepped and draped in the usual manner for laparoscopy.  Bladder was  drained, EUA carried out.  Uterus mid position, normal size, adnexa  unremarkable.  Specifically could not palpate the cyst.  Hulka tenaculum  was positioned after the bladder was drained.  Attention directed to the  abdomen where the subumbilical area was infiltrated with 0.25% Marcaine  plain.  A small incision was made.  Veress needle was introduced without  difficulty.  Its intra-abdominal position verified by pressure water  testing.  After a  3 L pneumoperitoneum was created, laparoscopic trocar  and sleeve were then introduced without difficulty.  Three  fingerbreadths above the symphysis in the midline,  5 mm trocar was  inserted under direct visualization.  The patient placed in  Trendelenburg and pelvic findings as follows.   The uterus was normal size.  The right tube and ovary were unremarkable.  There was filmy adhesions surrounding the  right ovary which could be  lysed easily in an avascular plane.  There was a 5 cm simple cyst on the  left that may have been paraovarian or even  peritubal. It was hard to  tell.  We could see another portion of small normal-appearing ovary  separate from the cyst.  There was no ascites or other abnormalities.  Starting on the right, the tube was grasped with an atraumatic grasper  placed on traction toward the midline.  The course of the ureter on the  right could be seen to be well below.  The Gyrus PK instrument was then  used to coagulate and divide the right IP ligament close to the tube and  ovary in sequential fashion down to and including the round ligament  with excellent hemostasis.  On the left the cyst and tube were elevated  again to identify the left IP ligament.  The course of the left ureter  could be seen to be well below.  In similar fashion the left IP ligament  was coagulated and divided down to including the round ligament, staying  close to the tube and ovary.  This was hemostatic.  The vaginal portion  of the procedure started at that point.  The patient was placed level  and the legs were extended.  Weighted speculum was positioned.  Cervix  grasped with a tenaculum.  Cervical vaginal mucosa was incised.  Posterior colpotomy performed without difficulty.  The bladder was  advanced superiorly with sharp and blunt dissection until the peritoneal  reflection could be identified.  This was entered sharply and a  retractor then used to gently elevate the bladder out of the field.  In  sequential manner, the uterosacral ligament on each side was coagulated  and divided with the handheld Gyrus PK instrument.  Similarly the  cardinal ligament, uterine vasculature pedicle and upper broad ligament  pedicles were coagulated and divided.  The fundus of the uterus then  delivered posteriorly, including bilateral adnexa and the intact cyst on  the left.  This was sent to  pathology.  The posterior cuff was then  closed with a running 2-0 Vicryl suture in locked fashion from 3 to 9  o'clock.  McCall culdoplasty suture was then placed from left  uterosacral ligament, picking up posterior peritoneum across to the  opposite uterosacral ligament and tied down for extra support.  Prior to  closure sponge, needle and instrument counts were reported as correct  x2.  The vaginal cuff was then closed right-to-left with interrupted 2-0  Monocryl sutures with excellent hemostasis.  Foley catheter positioned  draining clear urine.  Repeat laparoscopy carried out at that point and  even at reduced pressure.  The area of surgery was noted to be  hemostatic.   Instruments were removed, gas allowed to escape.  Defects closed with 4-  0 Dexon subcuticular sutures and Dermabond.  She tolerated this well,  went to recovery room in good condition.      Richard M. Kathleen Sweeney, M.D.  Electronically Signed     RMH/MEDQ  D:  11/19/2006  T:  11/19/2006  Job:  [id number]

## 2010-06-30 NOTE — Discharge Summary (Signed)
[facility]  Patient:    ANGEL, HOBDY                     MRN: [id number] Adm. Date:  [id number] Disc. Date: [id number] Attending:  Gerhard Munch                           Discharge Summary  REASON FOR ADMISSION:  This patient is a 50 year old married white female who was brought to the [facility] Emergency Room after she attempted suicide on a half of bottle of her husbands Xanax.  ADMISSION DIAGNOSES: Axis I:    1. Major depressive disorder, recurrent, severe.            2. Cannabis abuse. Axis II:   Deferred. Axis III:  Some right lower quadrant pain. Axis IV:   Moderate. Axis V:    30 on admission.  LABORATORY DATA:  Patient had no laboratories done on this hospitalization. She did have laboratories done in the emergency room which included a urine drug screen, alcohol level and acetaminophen level.  As stated on her history and physical, she had had a recent physical with a thyroid check prior to admission and there were no abnormalities.  HOSPITAL COURSE:  Patient was initially admitted on the unit and placed on suicide precautions.  She was restarted on Effexor 225 mg p.o. q.d. and we started a trial of Remeron 30 mg p.o. q.h.s. and Seroquel 25 mg p.o. p.r.n. for insomnia as patient stated she was having significant difficulty with sleep.  On September 25, 1999, she reported sleeping much better with the Seroquel.  Her suicidal thoughts were improving.  Her affect was still hyperblunted and she perceived no change in her appetite yet from the Remeron. She was participating in all groups and unit activities the day after her admission and a marital session was held that evening with her husband at which point patient stated she was no longer suicidal and wished to return home to him, that her husband did not feel he was an alcoholic but was willing to attend AA meetings.  On the day of discharge, on September 26, 1999, patients affect was  significantly brighter.  It seemed to help quite a bit to have a session with her husband.  She did start noticing a significant increase in her appetite.  Her sleep was a little bit more difficult but, the night previously, was some difficulty with nightmares, however, much improved from prior to admission.  She was tolerating medications otherwise without significant side effect and she was no longer suicidal and she was willing to continue in outpatient care.  We did discuss her use of cannabis and she has made a commitment to not use that at the current time.  CONDITION ON DISCHARGE:  Stable.  DISCHARGE MEDICATIONS: 1. Effexor XR 75 mg 2 p.o. q.a.m. and 1 p.o. q.p.m.  She has a prescription    for this medication.  She was given prescriptions for:  2. Remeron 30 mg 1 p.o. q.h.s. #30 with no refill. 3. Seroquel 25 mg 1 p.o. q.h.s. #30 p.r.n. for insomnia.  DISCHARGE INSTRUCTIONS:  Activity with no restrictions.  Diet with no restrictions ______________.  FOLLOW-UP:  Joe _________, her therapist, within the next two weeks and myself within the next month upon discharge.  DISCHARGE DIAGNOSES: Axis I:    1. Major depressive disorder, recurrent, severe.  2. Cannabis abuse. Axis II:   None. Axis III:  Right lower quadrant pain. Axis IV:   Moderate. Axis V:    On discharge 70. DD:  09/26/99 TD:  09/26/99 Job: [id number]

## 2010-06-30 NOTE — H&P (Signed)
[facility]  Patient:    Kathleen Sweeney, Kathleen Sweeney                     MRN: [id number] Adm. Date:  [id number] Disc. Date: [id number] Attending:  Gerhard Munch                         History and Physical  IDENTIFYING INFORMATION:  This is a 50 year old married white female.  REASON FOR ADMISSION:  The patient had been brought to the [facility] ER, status post an overdose on her husbands Xanax 1 mg tablets, approximately half a bottle taken.  The patient was charcoal lavaged and once stabilized, she was admitted voluntarily to [facility] for further stabilization and medication management.  HISTORY OF PRESENT ILLNESS:  The patient reports her son left for [employer] yesterday for college and her husband started drinking and then started arguing with her about how she does not love him.  She has had marital difficulties with him for awhile.  they were separated for a month in April and then she returned back in May and reports she is "just not happy."  She has had increasing depressive symptoms with trouble sleeping, not eating or not having an appetite.  She had suicidal ideation before her attempt last night.  She is not sure she wants to continue her marriage.  She does not feel they have anything in common.  She feels isolated, has no friends or support since moving here to [place] 15 years ago from [place] where all of her family is.  The patient initially said in the emergency room she was just trying to get some sleep but then did admit to me there was suicidal intent behind her Xanax overdose.  She has very low self-esteem and does not like herself.  Due to her suicidal attempt and the severity of her depressive symptoms, she was admitted for further stabilization.  PAST PSYCHIATRIC HISTORY:  She was hospitalized last October under Dr. Christiana Fuchs service when she had a "nervous breakdown" and apparently took an overdose then but not as severe as  this time.  SOCIAL HISTORY:  She has been married for 22 years and did, as stated above, separated from her husband for a month.  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  She has one 23 year old son who still lives at home and the 10 year old  who went off to college.  FAMILY HISTORY:  Positive for a sister with depression.  ALCOHOL AND DRUG HISTORY:  She had not been forthcoming with me about her marijuana use, but admits to smoking a joint approximately every other day. She denies any alcohol use and a urine drug screen performed which was positive for cannabis and for her benzodiazepines.  PAST MEDICAL HISTORY:  She is in good health but did recently see OB/GYN who wishes to look at her right ovary.  Reportedly there is left lower quadrant pain and he wishes to look at her ovary through a scope.  She had some recent medical tests which included her thyroid which was normal.  MEDICATIONS ON ADMISSION:  Two weeks ago we had recently increased her Effexor from 150 q.d. to 225 mg.  DRUG ALLERGIES:  PENICILLIN, ERYTHROMYCIN.  PHYSICAL EXAMINATION:  She had a complete physical in the emergency  room which is listed on the chart and they charcoal lavaged her and she was medically cleared and I will refer to their assessment.  MENTAL STATUS EXAMINATION:  She is aware ad oriented x 3.  Her appearance is disheveled but she is pleasant ad cooperative throughout interview.  Her speech was normal tone and rate.  Her mood was depressed.  She continues to endorse suicidal ideation, but denies any auditory or visual hallucinations. Cognitively, she can remember three out of three objects at zero and five minutes and her thinking is grossly intact.  DIAGNOSES: Axis I:   Major depressive disorder, recurrent, severe and cannabis abuse. Axis II:   Deferred. Axis III: Right lower quadrant pain which is manageable at the moment. Axis IV:  Moderate with problems with primary support group and occupational           problems. Axis V:   30 admission, past year 71.  PLAN:  Admit her to [facility] for stabilization and medication management.  Will continue Effexor at 225 mg p.o. q.d. and start Remeron at 30 mg p.o. q.hs. as augmentation therapy and potentially help with sleep and appetite. We will also have Seroquel 25 mg p.o. p.r.n. available for insomnia. The patient has been using cannabis to try to help her sleep at night.  We will work with her on stopping cannabis use as it is a depressant and she is to attend all groups to work on alternative coping skills for current stressors.  A marital session will be done prior to discharge.  When she is stabilized, she will discharged to lesser level of care.  Tentative length of stay 4-5 days. DD:  09/24/99 TD:  09/25/99 Job: [id number]

## 2010-08-22 ENCOUNTER — Inpatient Hospital Stay (HOSPITAL_COMMUNITY)
Admission: EM | Admit: 2010-08-22 | Discharge: 2010-08-26 | Disposition: A | Discharge status: Nursing Home (Includes ICF) | Payer: 59 | Source: Home / Self Care | Attending: Internal Medicine | Admitting: Internal Medicine

## 2010-08-22 DIAGNOSIS — T3995XA Adverse effect of unspecified nonopioid analgesic, antipyretic and antirheumatic, initial encounter: Secondary | ICD-10-CM | POA: Diagnosis present

## 2010-08-22 DIAGNOSIS — J189 Pneumonia, unspecified organism: Secondary | ICD-10-CM | POA: Diagnosis not present

## 2010-08-22 DIAGNOSIS — E860 Dehydration: Secondary | ICD-10-CM | POA: Diagnosis present

## 2010-08-22 DIAGNOSIS — G43909 Migraine, unspecified, not intractable, without status migrainosus: Secondary | ICD-10-CM | POA: Diagnosis present

## 2010-08-22 DIAGNOSIS — Z880 Allergy status to penicillin: Secondary | ICD-10-CM

## 2010-08-22 DIAGNOSIS — E876 Hypokalemia: Secondary | ICD-10-CM | POA: Diagnosis not present

## 2010-08-22 DIAGNOSIS — K5732 Diverticulitis of large intestine without perforation or abscess without bleeding: Principal | ICD-10-CM | POA: Diagnosis present

## 2010-08-22 DIAGNOSIS — Z833 Family history of diabetes mellitus: Secondary | ICD-10-CM

## 2010-08-22 DIAGNOSIS — Y92009 Unspecified place in unspecified non-institutional (private) residence as the place of occurrence of the external cause: Secondary | ICD-10-CM

## 2010-08-22 DIAGNOSIS — Z881 Allergy status to other antibiotic agents status: Secondary | ICD-10-CM

## 2010-08-22 DIAGNOSIS — Z8249 Family history of ischemic heart disease and other diseases of the circulatory system: Secondary | ICD-10-CM

## 2010-08-22 DIAGNOSIS — K5900 Constipation, unspecified: Secondary | ICD-10-CM | POA: Diagnosis present

## 2010-08-22 DIAGNOSIS — E875 Hyperkalemia: Secondary | ICD-10-CM | POA: Diagnosis present

## 2010-08-22 DIAGNOSIS — N179 Acute kidney failure, unspecified: Secondary | ICD-10-CM | POA: Diagnosis present

## 2010-08-22 DIAGNOSIS — Z780 Asymptomatic menopausal state: Secondary | ICD-10-CM

## 2010-08-22 DIAGNOSIS — E039 Hypothyroidism, unspecified: Secondary | ICD-10-CM | POA: Diagnosis present

## 2010-08-22 DIAGNOSIS — Z87891 Personal history of nicotine dependence: Secondary | ICD-10-CM

## 2010-08-22 DIAGNOSIS — I498 Other specified cardiac arrhythmias: Secondary | ICD-10-CM | POA: Diagnosis not present

## 2010-08-22 DIAGNOSIS — G4700 Insomnia, unspecified: Secondary | ICD-10-CM | POA: Diagnosis present

## 2010-08-22 DIAGNOSIS — I10 Essential (primary) hypertension: Secondary | ICD-10-CM | POA: Diagnosis present

## 2010-08-22 DIAGNOSIS — F341 Dysthymic disorder: Secondary | ICD-10-CM | POA: Diagnosis present

## 2010-08-23 ENCOUNTER — Encounter (HOSPITAL_COMMUNITY): Payer: Self-pay

## 2010-08-23 ENCOUNTER — Emergency Department (HOSPITAL_COMMUNITY): Payer: 59

## 2010-08-23 ENCOUNTER — Inpatient Hospital Stay (HOSPITAL_COMMUNITY): Payer: 59

## 2010-08-23 LAB — URINE MICROSCOPIC-ADD ON

## 2010-08-23 LAB — LIPASE, BLOOD: Lipase: 15 U/L (ref 11–59)

## 2010-08-23 LAB — CARDIAC PANEL(CRET KIN+CKTOT+MB+TROPI)
Relative Index: INVALID (ref 0.0–2.5)
Total CK: 75 U/L (ref 7–177)
Total CK: 106 U/L (ref 7–177)
Troponin I: <0.3 ng/mL (ref <0.30)
Troponin I: <0.3 ng/mL (ref <0.30)

## 2010-08-23 LAB — CBC
MCH: 28 pg (ref 26.0–34.0)
MCV: 84.9 fL (ref 78.0–100.0)
Platelets: 235 10*3/uL (ref 150–400)
RBC: 5.36 MIL/uL — ABNORMAL HIGH (ref 3.87–5.11)
RDW: 12.8 % (ref 11.5–15.5)
WBC: 12.2 10*3/uL — ABNORMAL HIGH (ref 4.0–10.5)

## 2010-08-23 LAB — URINALYSIS, ROUTINE W REFLEX MICROSCOPIC
Glucose, UA: 100 mg/dL — AB
Nitrite: NEGATIVE
Nitrite: NEGATIVE
Protein, ur: >300 mg/dL — AB
Specific Gravity, Urine: 1.017 (ref 1.005–1.030)
Urobilinogen, UA: 0.2 mg/dL (ref 0.0–1.0)
Urobilinogen, UA: 0.2 mg/dL (ref 0.0–1.0)

## 2010-08-23 LAB — PROTIME-INR: INR: 1.13 (ref 0.00–1.49)

## 2010-08-23 LAB — MAGNESIUM: Magnesium: 1.9 mg/dL (ref 1.5–2.5)

## 2010-08-23 LAB — HEPATIC FUNCTION PANEL
ALT: 12 U/L (ref 0–35)
Bilirubin, Direct: 0.1 mg/dL (ref 0.0–0.3)
Indirect Bilirubin: 0.3 mg/dL (ref 0.3–0.9)
Total Protein: 6.2 g/dL (ref 6.0–8.3)

## 2010-08-23 LAB — DIFFERENTIAL
Basophils Relative: 0 % (ref 0–1)
Eosinophils Absolute: 0.1 10*3/uL (ref 0.0–0.7)
Eosinophils Relative: 0 % (ref 0–5)
Monocytes Relative: 7 % (ref 3–12)
Neutrophils Relative %: 80 % — ABNORMAL HIGH (ref 43–77)

## 2010-08-23 LAB — BASIC METABOLIC PANEL
CO2: 26 mEq/L (ref 19–32)
Calcium: 10.1 mg/dL (ref 8.4–10.5)
Creatinine, Ser: 4.04 mg/dL — ABNORMAL HIGH (ref 0.50–1.10)
GFR calc Af Amer: 14 mL/min — ABNORMAL LOW (ref >60)
Sodium: 136 mEq/L (ref 135–145)

## 2010-08-23 LAB — TSH: TSH: 10.183 u[IU]/mL — ABNORMAL HIGH (ref 0.350–4.500)

## 2010-08-23 LAB — AMYLASE: Amylase: 41 U/L (ref 0–105)

## 2010-08-23 LAB — APTT: aPTT: 34 seconds (ref 24–37)

## 2010-08-23 IMAGING — US US RENAL
1 series · 14 of 24 positions shown · non-contrast
Comparison: CT [DATE].  CT [DATE].  Ultrasound [DATE].

CLINICAL DATA: History of acute renal failure.  History of
hypertension.

RENAL/URINARY TRACT ULTRASOUND COMPLETE

[Series 1: us renal · 0.32mm/px · 14 of 24 slices shown]
[im 1/24]
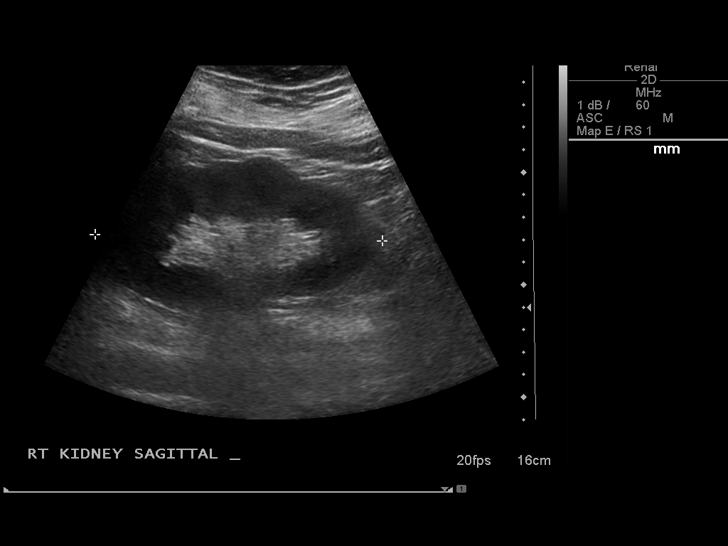
[im 3/24]
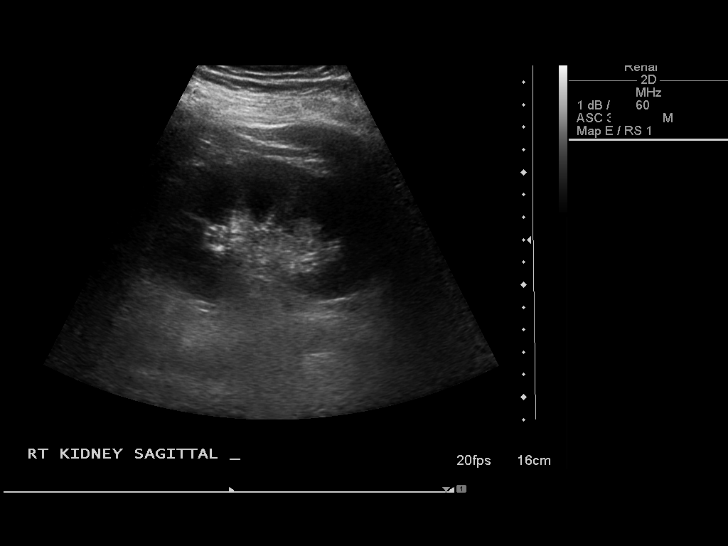
[im 5/24]
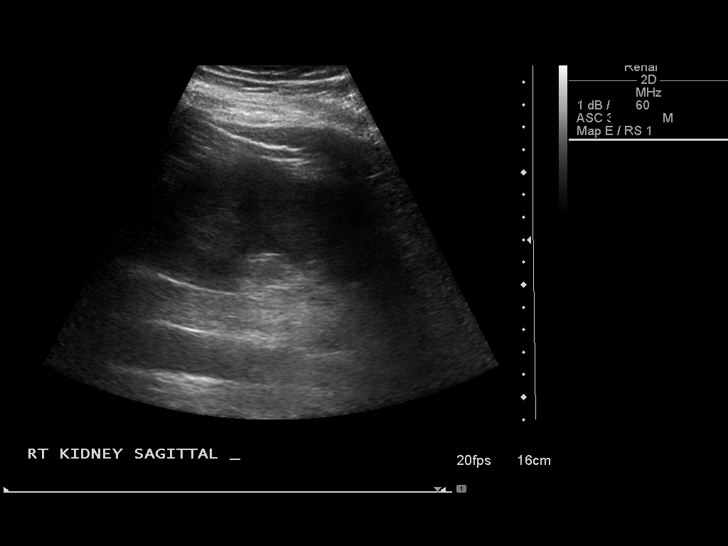
[im 7/24]
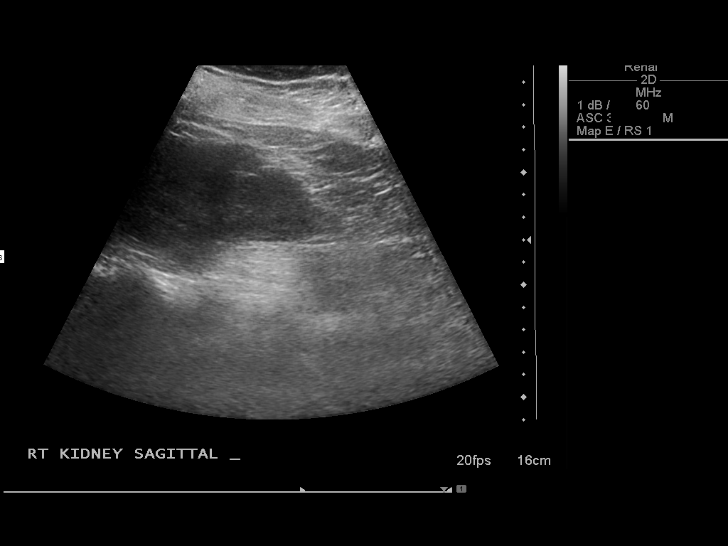
[im 8/24]
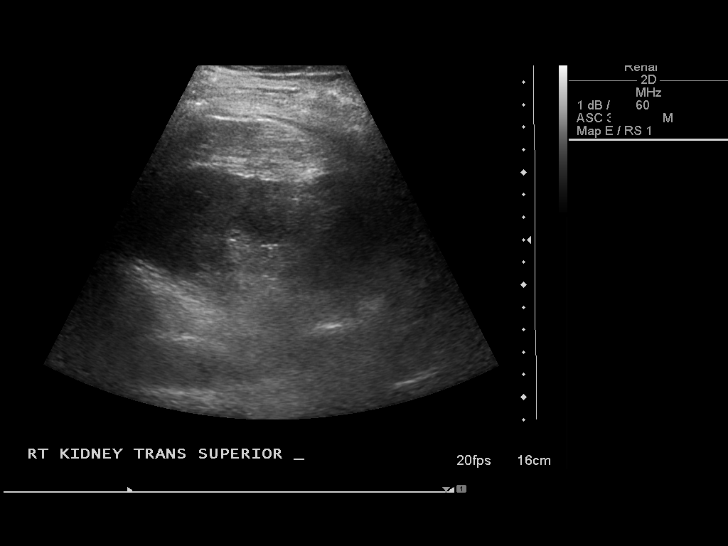
[im 10/24]
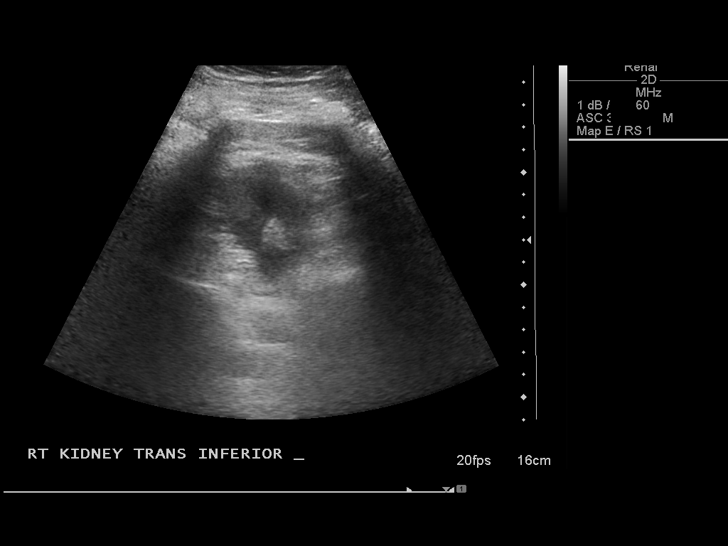
[im 12/24]
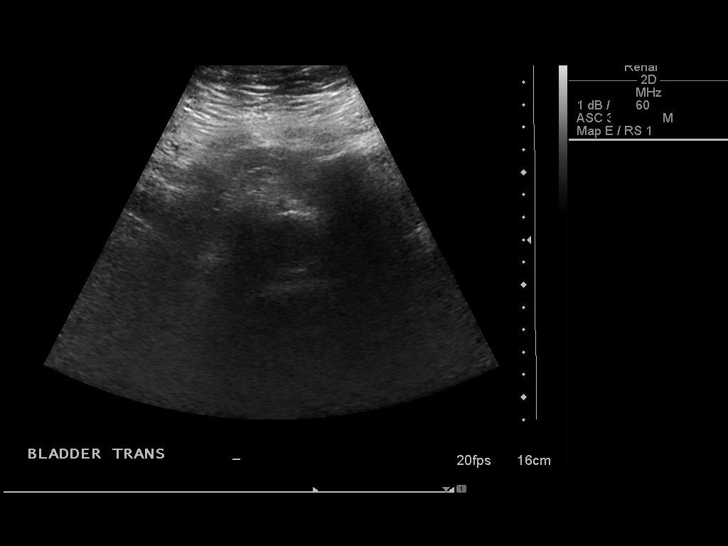
[im 13/24]
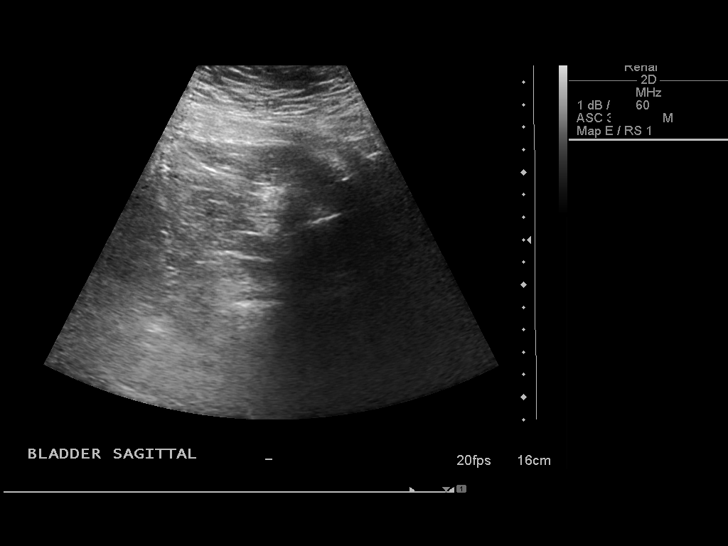
[im 15/24]
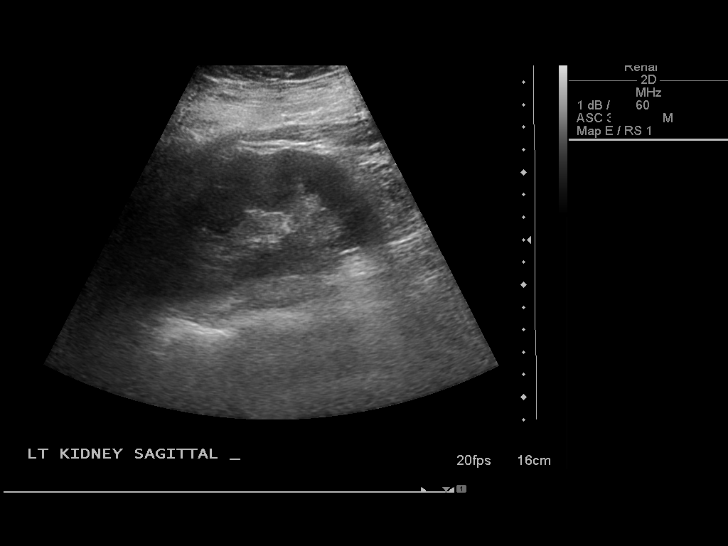
[im 17/24]
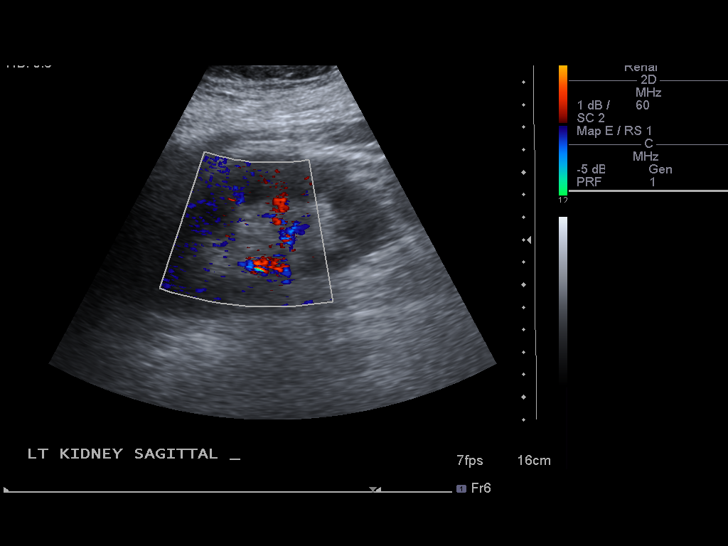
[im 19/24]
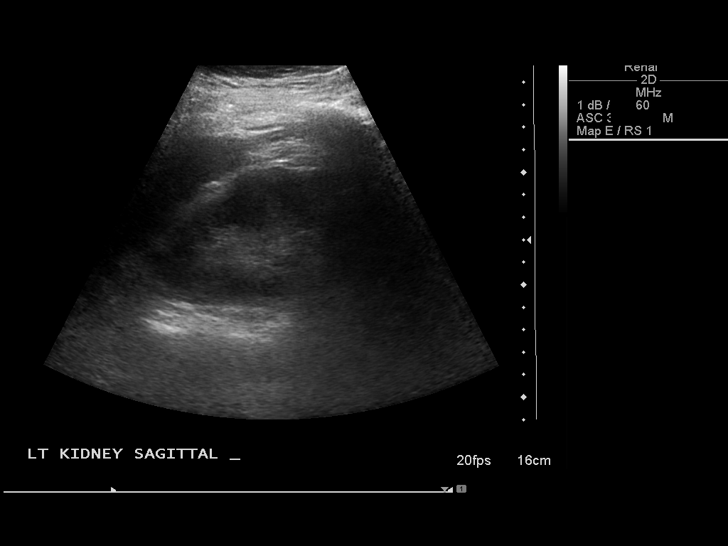
[im 20/24]
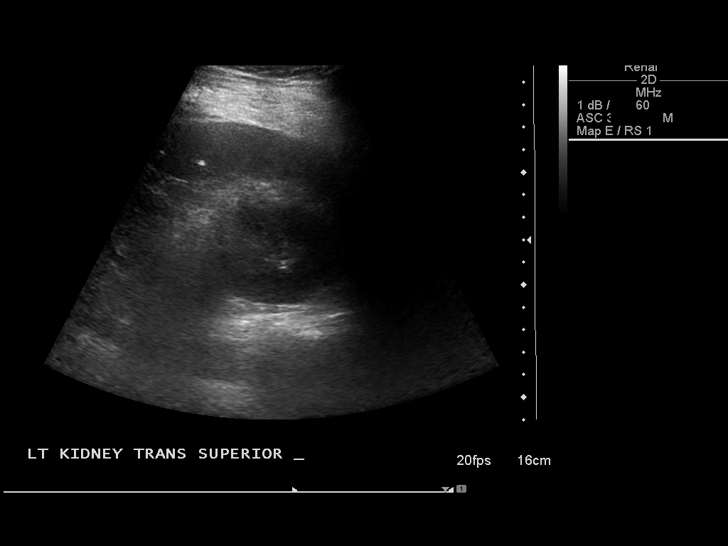
[im 22/24]
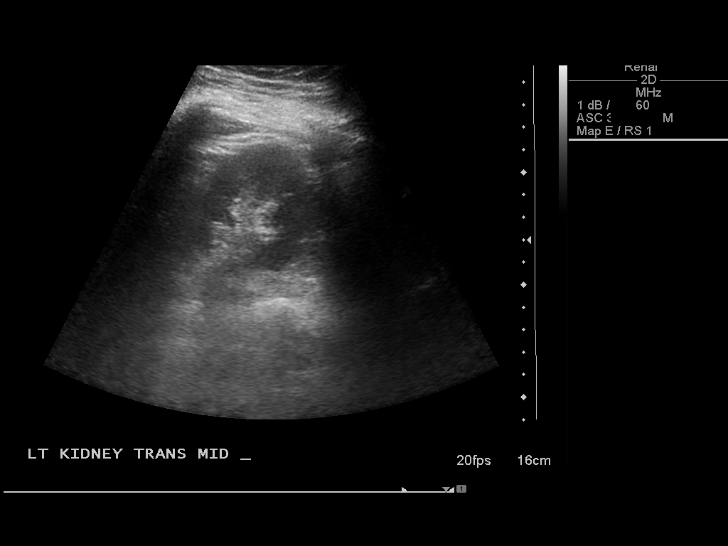
[im 24/24]
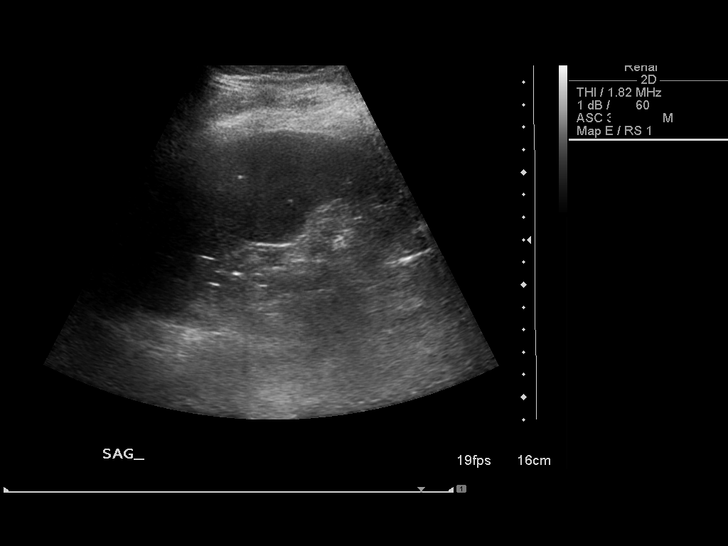

[14 of 24 positions shown; findings below may reference images not displayed]

FINDINGS: Right Kidney:  Right renal length is 10.7 cm.

Left Kidney:  Left renal length is 11.8 cm.

Examination of each kidney shows no evidence of hydronephrosis,
solid or cystic mass, calculus, parenchymal loss, or parenchymal
textural abnormality.

Bladder:  Urinary bladder is decompressed. There is a Foley
catheter balloon visualized.

Incidental note is made of splenic granuloma calcifications.
Splenic granulomatous calcification has demonstrated on prior CT.
IMPRESSION: No renal lesion is evident. No hydronephrosis.  Urinary bladder is
decompressed by Foley catheter.  Splenic granuloma calcifications.

## 2010-08-23 IMAGING — CR DG CHEST 2V
2 series · 2 of 2 positions shown · non-contrast
Comparison: [DATE].

CLINICAL DATA: Chest pain.  Ex-smoker.

CHEST - 2 VIEW

[w chest pa]
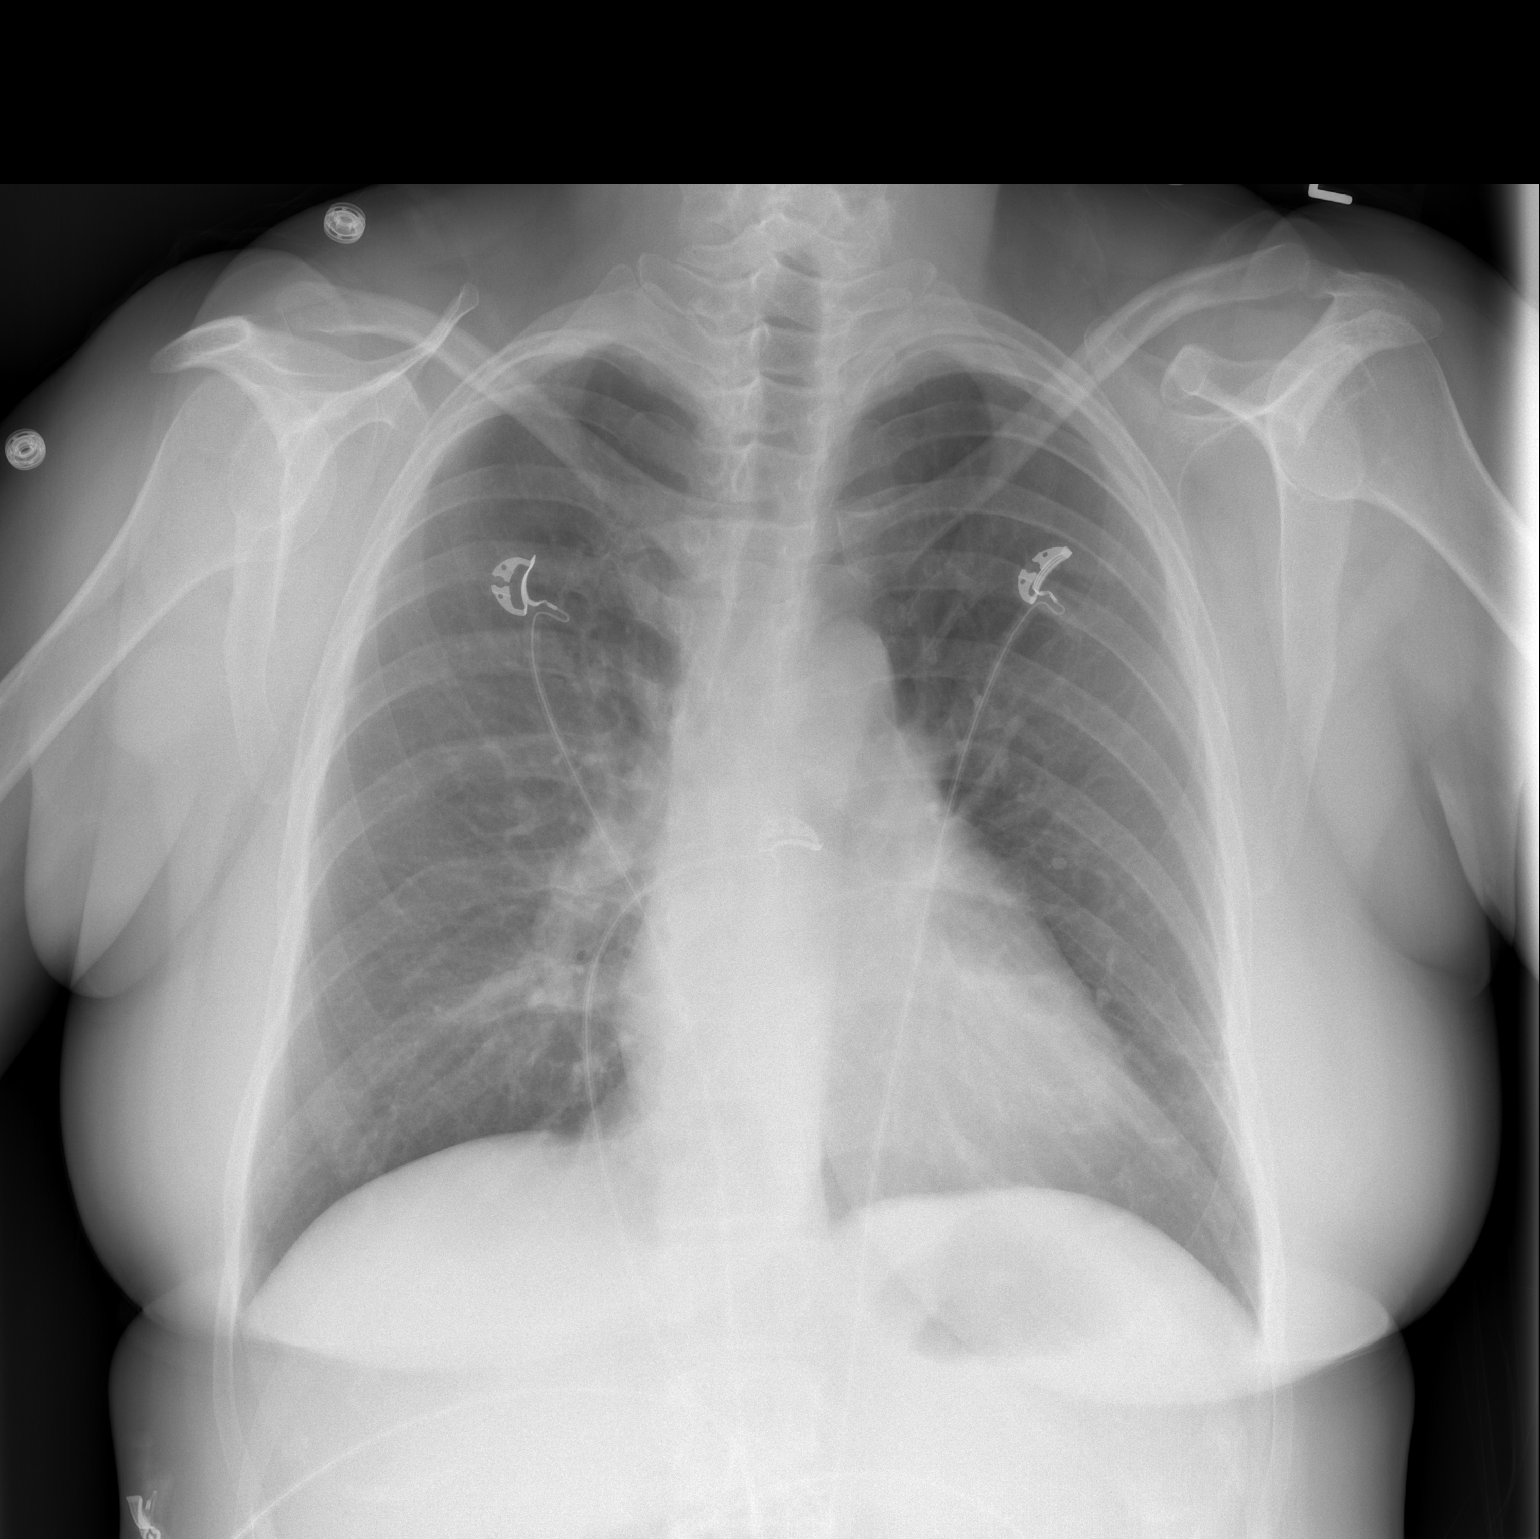

[w chest lat]
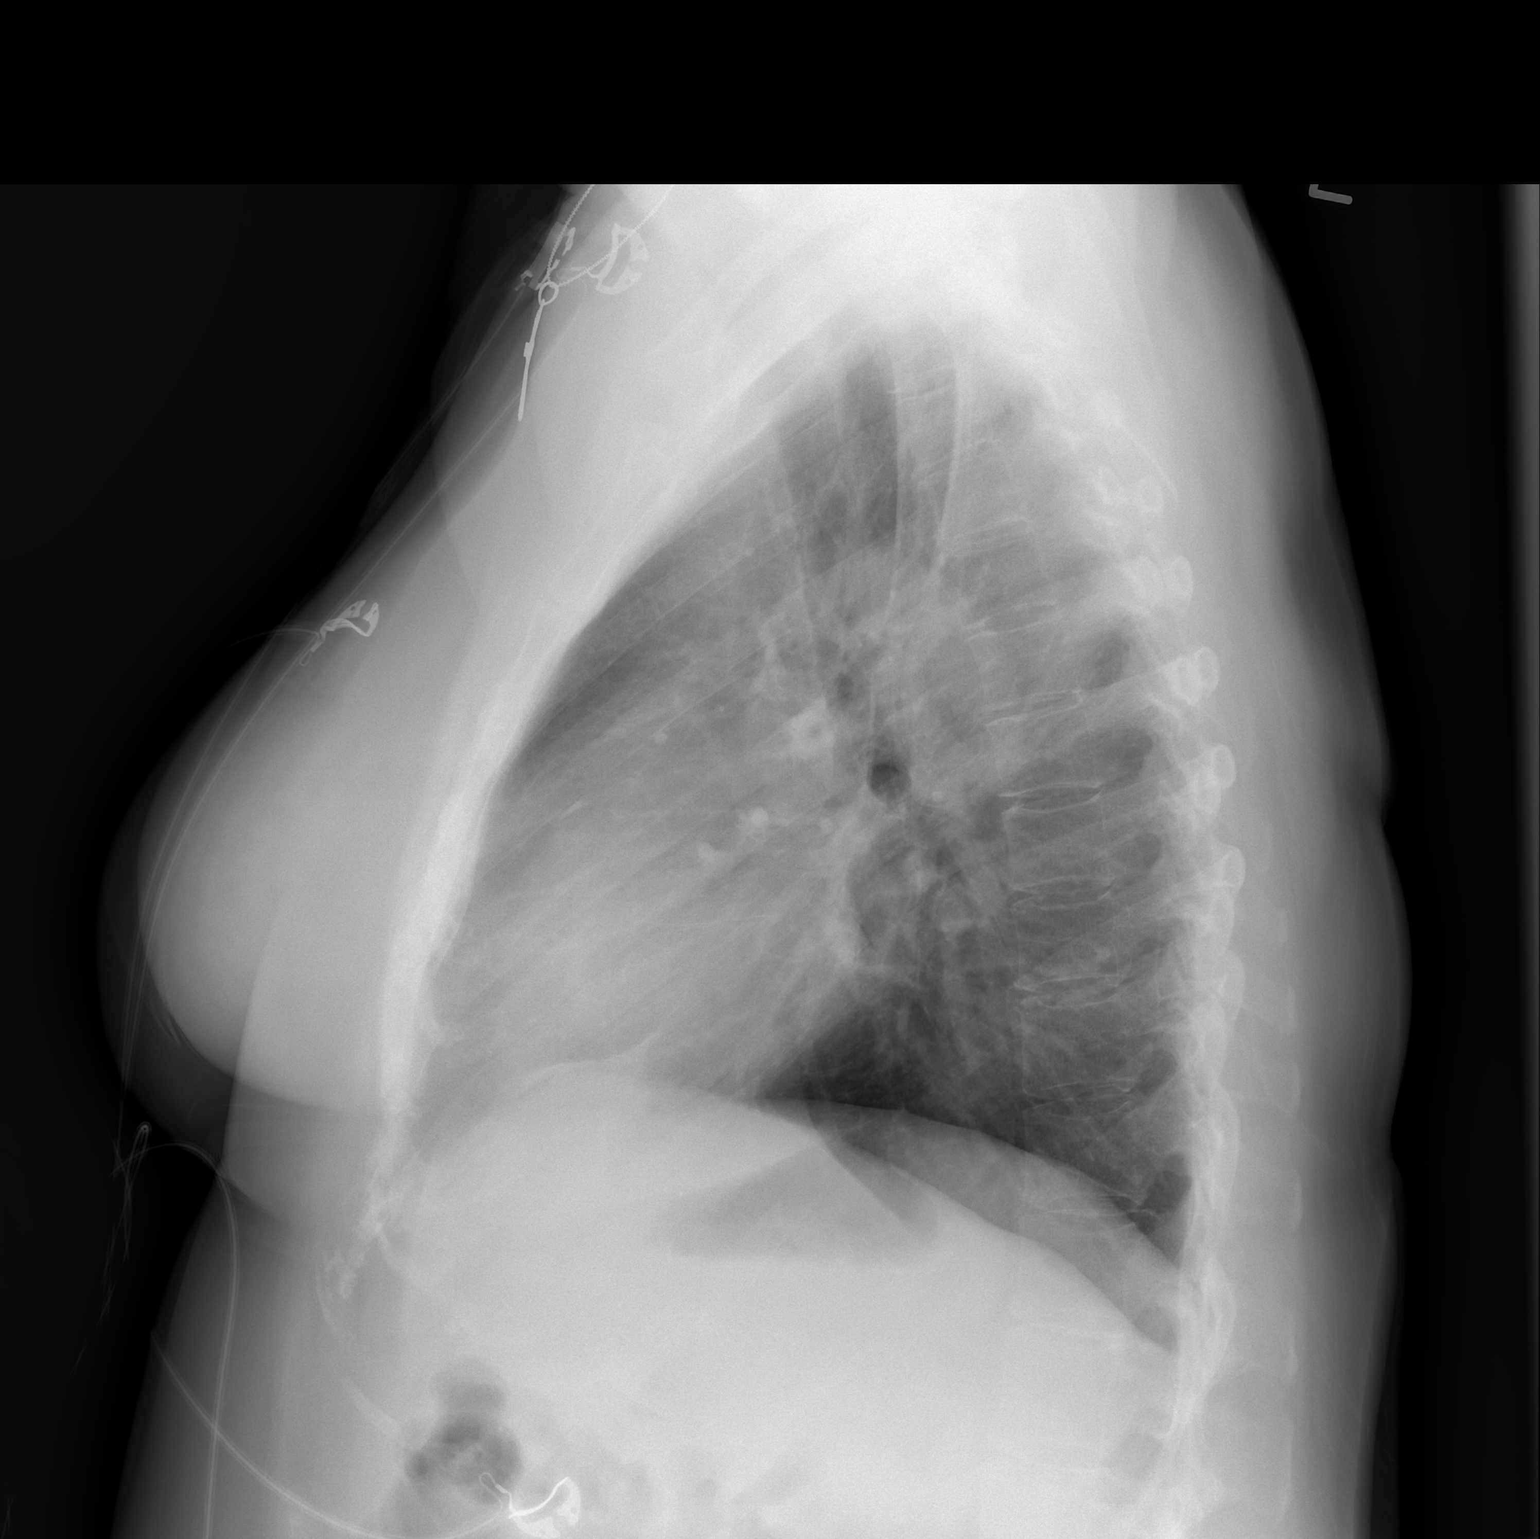

[2 of 2 positions shown; findings below may reference images not displayed]

FINDINGS: The cardiac silhouette is normal size and shape.
Mediastinal and hilar contours appear stable.  Lungs are free of
infiltrates.  No pneumothorax is evident. No pleural abnormality is
evident. Bones appear average for age. There is central
peribronchial thickening.
IMPRESSION: Normal size heart with no evidence of pulmonary edema
or pleural effusion.  No pneumothorax.

There is central peribronchial thickening.  This may be associated
with bronchitis, asthma, and reactive airway disease.

## 2010-08-23 IMAGING — CT CT ABD-PELV W/O CM
2 of 4 series · 17 of 46 positions shown, 19 images · non-contrast
Comparison: [DATE]

CLINICAL DATA: Diffuse abdominal pain, nausea and vomiting.  White
blood cell

CT ABDOMEN AND PELVIS WITHOUT CONTRAST
TECHNIQUE: Multidetector CT imaging of the abdomen and pelvis was
performed following the standard protocol without intravenous
contrast.

[Series 2: rtn ap without · axial · non-contrast · 0.63mm/px · z∈[-516,-126]mm · 14 of 86 slices shown, 16 images]
[im 4/86  soft-tissue]
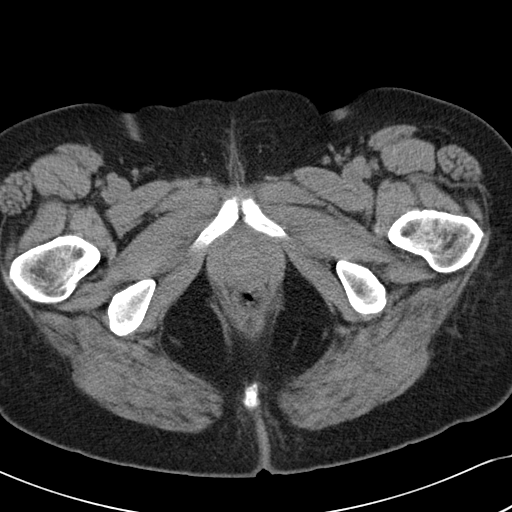
[im 4/86  bone]
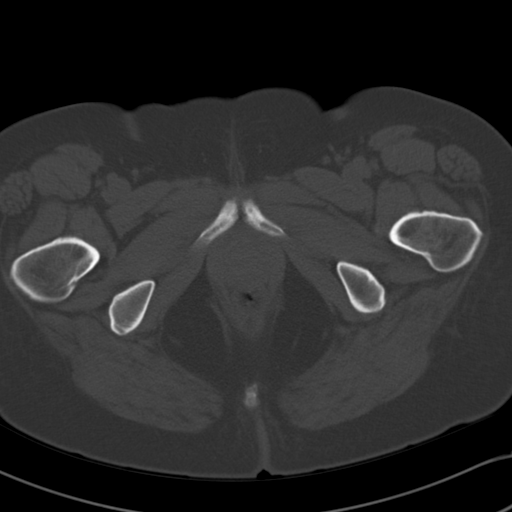
[im 11/86  soft-tissue]
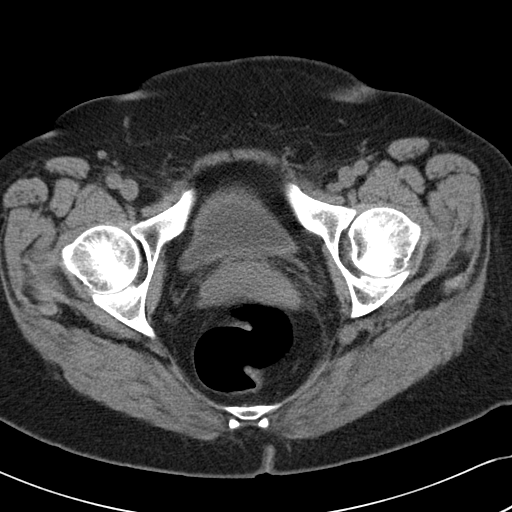
[im 18/86  soft-tissue]
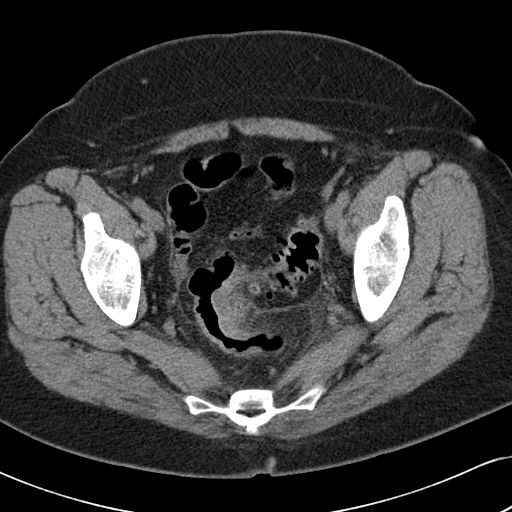
[im 24/86  soft-tissue]
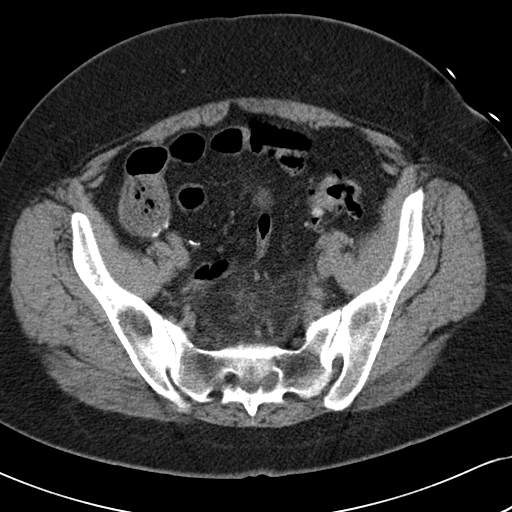
[im 28/86  soft-tissue]
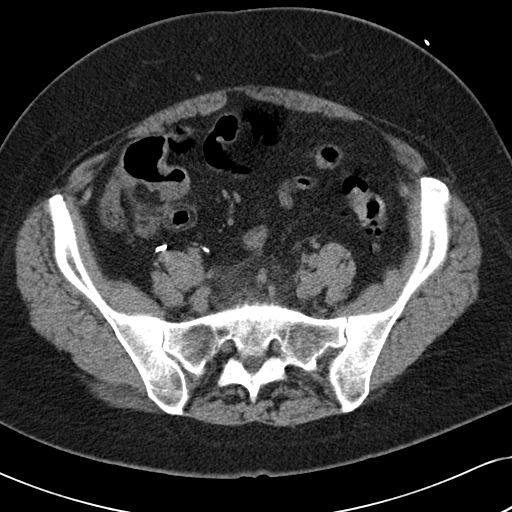
[im 35/86  soft-tissue]
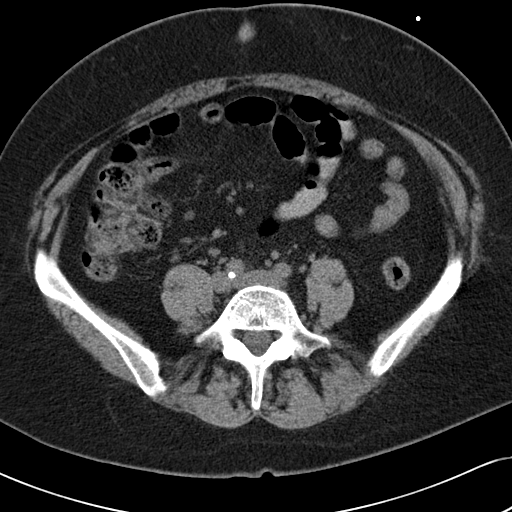
[im 41/86  soft-tissue]
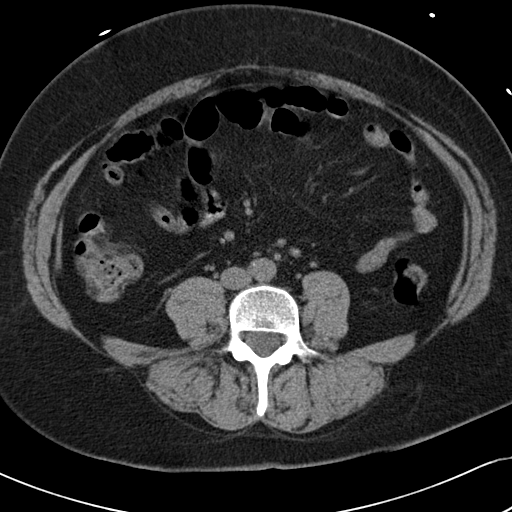
[im 45/86  soft-tissue]
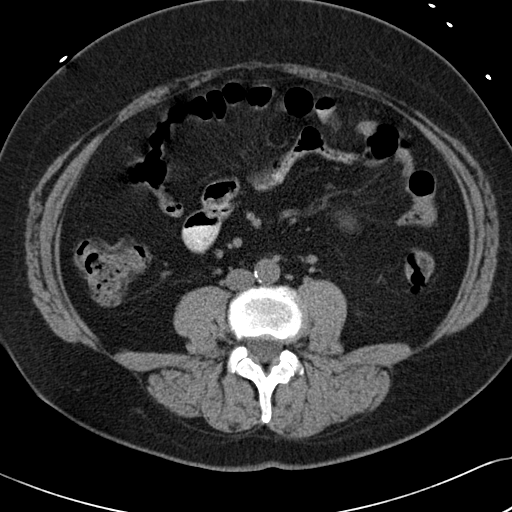
[im 52/86  soft-tissue]
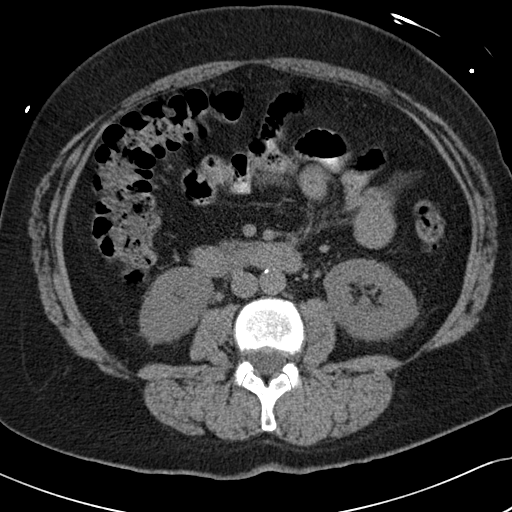
[im 52/86  bone]
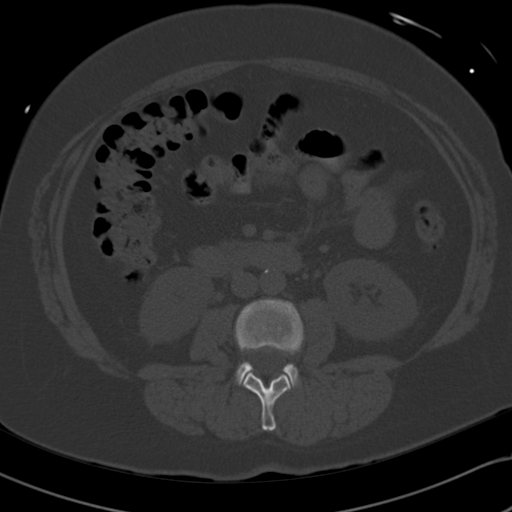
[im 58/86  soft-tissue]
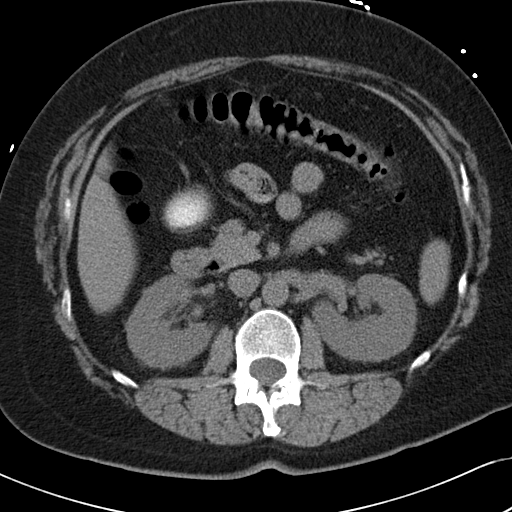
[im 65/86  soft-tissue]
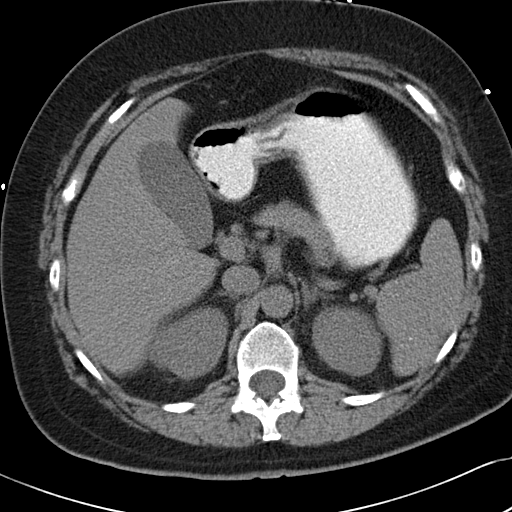
[im 69/86  soft-tissue]
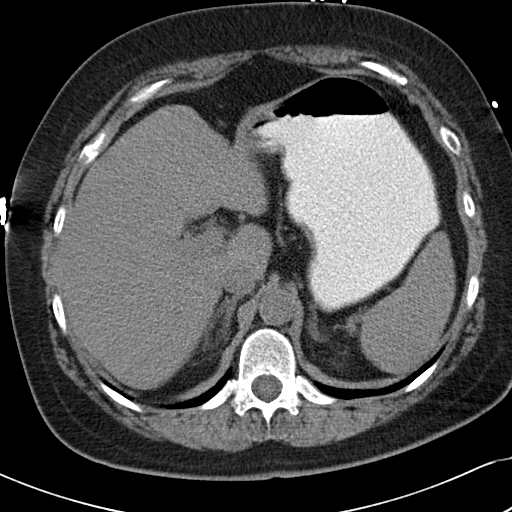
[im 75/86  soft-tissue]
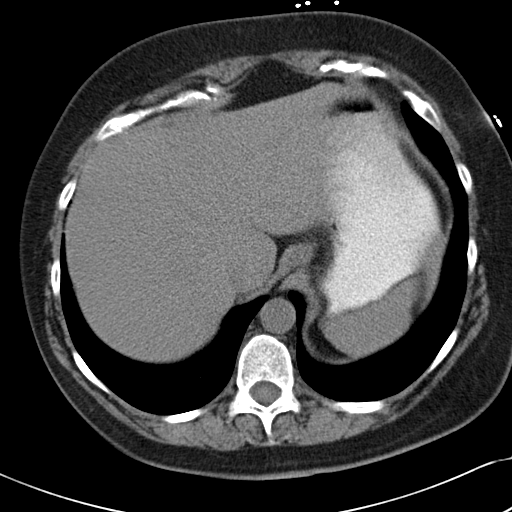
[im 82/86  soft-tissue]
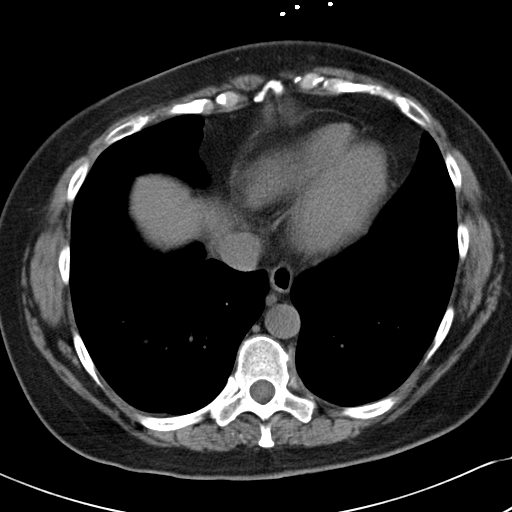

[Series 602: <mpr thick range> · coronal · 0.87mm/px · 3 of 90 slices shown]
[im 30/90  soft-tissue]
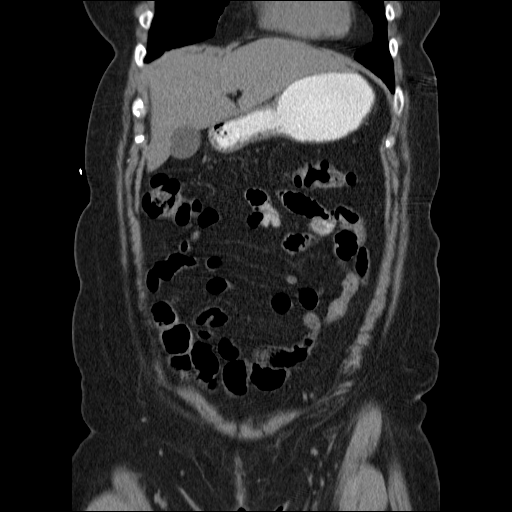
[im 40/90  soft-tissue]
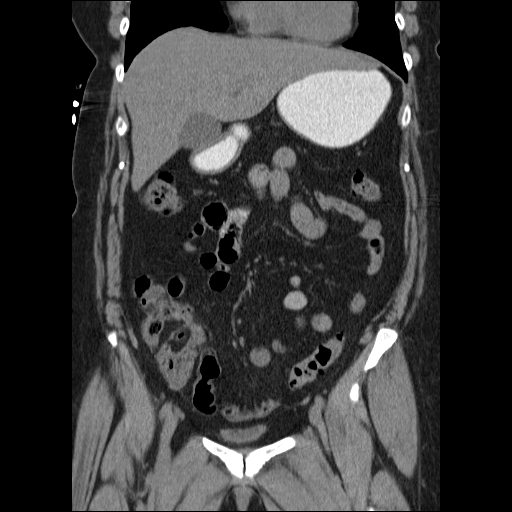
[im 50/90  soft-tissue]
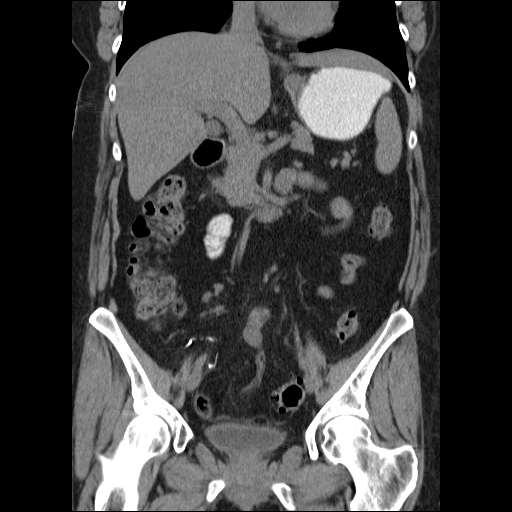

[17 of 46 positions shown; findings below may reference images not displayed]

FINDINGS: Calcified granulomas in the left lung base.  The
unenhanced liver, spleen, gallbladder, bile ducts, pancreas,
adrenal glands, kidneys, and retroperitoneal lymph nodes are
unremarkable.  Mild calcification within nonaneurysmal abdominal
aorta.  No free fluid or free air in the abdomen.  The stomach is
normal.  Small bowel are not dilated.

Pelvis:  Surgical clips in the right lower quadrant, likely
representing previous appendectomy.  Diverticulosis of the sigmoid
colon.  Focal area of wall thickening and pericolonic fat stranding
at the rectosigmoid junction suggesting early diverticulitis.  No
abscess.  Recommend follow-up evaluation of the colon after
resolution of acute symptoms to exclude underlying colonic mass
lesion.
IMPRESSION: Wall thickening and inflammation at the rectosigmoid junction
consistent with mild diverticulitis.  No abscess demonstrated.
Recommend follow-up evaluation of the colon after resolution of
acute symptoms to this occlude underlying colonic mass lesion.

## 2010-08-24 ENCOUNTER — Inpatient Hospital Stay (HOSPITAL_COMMUNITY): Payer: 59

## 2010-08-24 LAB — COMPREHENSIVE METABOLIC PANEL
Albumin: 2.5 g/dL — ABNORMAL LOW (ref 3.5–5.2)
Alkaline Phosphatase: 97 U/L (ref 39–117)
BUN: 32 mg/dL — ABNORMAL HIGH (ref 6–23)
CO2: 20 mEq/L (ref 19–32)
Chloride: 109 mEq/L (ref 96–112)
GFR calc Af Amer: 8 mL/min — ABNORMAL LOW (ref >60)
GFR calc non Af Amer: 7 mL/min — ABNORMAL LOW (ref >60)
Glucose, Bld: 101 mg/dL — ABNORMAL HIGH (ref 70–99)
Potassium: 5.3 mEq/L — ABNORMAL HIGH (ref 3.5–5.1)
Total Bilirubin: 0.3 mg/dL (ref 0.3–1.2)

## 2010-08-24 LAB — CBC
HCT: 34.5 % — ABNORMAL LOW (ref 36.0–46.0)
Hemoglobin: 11.3 g/dL — ABNORMAL LOW (ref 12.0–15.0)
MCV: 85.6 fL (ref 78.0–100.0)
WBC: 8.8 10*3/uL (ref 4.0–10.5)

## 2010-08-24 LAB — CARDIAC PANEL(CRET KIN+CKTOT+MB+TROPI)
Relative Index: INVALID (ref 0.0–2.5)
Troponin I: <0.3 ng/mL (ref <0.30)

## 2010-08-24 LAB — URINE CULTURE

## 2010-08-24 LAB — CREATININE, URINE, RANDOM: Creatinine, Urine: 162.9 mg/dL

## 2010-08-24 LAB — DIFFERENTIAL
Basophils Absolute: 0 10*3/uL (ref 0.0–0.1)
Lymphocytes Relative: 13 % (ref 12–46)
Lymphs Abs: 1.2 10*3/uL (ref 0.7–4.0)
Neutro Abs: 7 10*3/uL (ref 1.7–7.7)

## 2010-08-24 IMAGING — CR DG ABDOMEN ACUTE W/ 1V CHEST
4 series · 4 of 4 positions shown · non-contrast
Comparison: CT abdomen and pelvis [DATE].  Single view chest
[DATE].

CLINICAL DATA: Nausea and vomiting.  Constipation.

ACUTE ABDOMEN SERIES (ABDOMEN 2 VIEW & CHEST 1 VIEW)

[w chest pa]
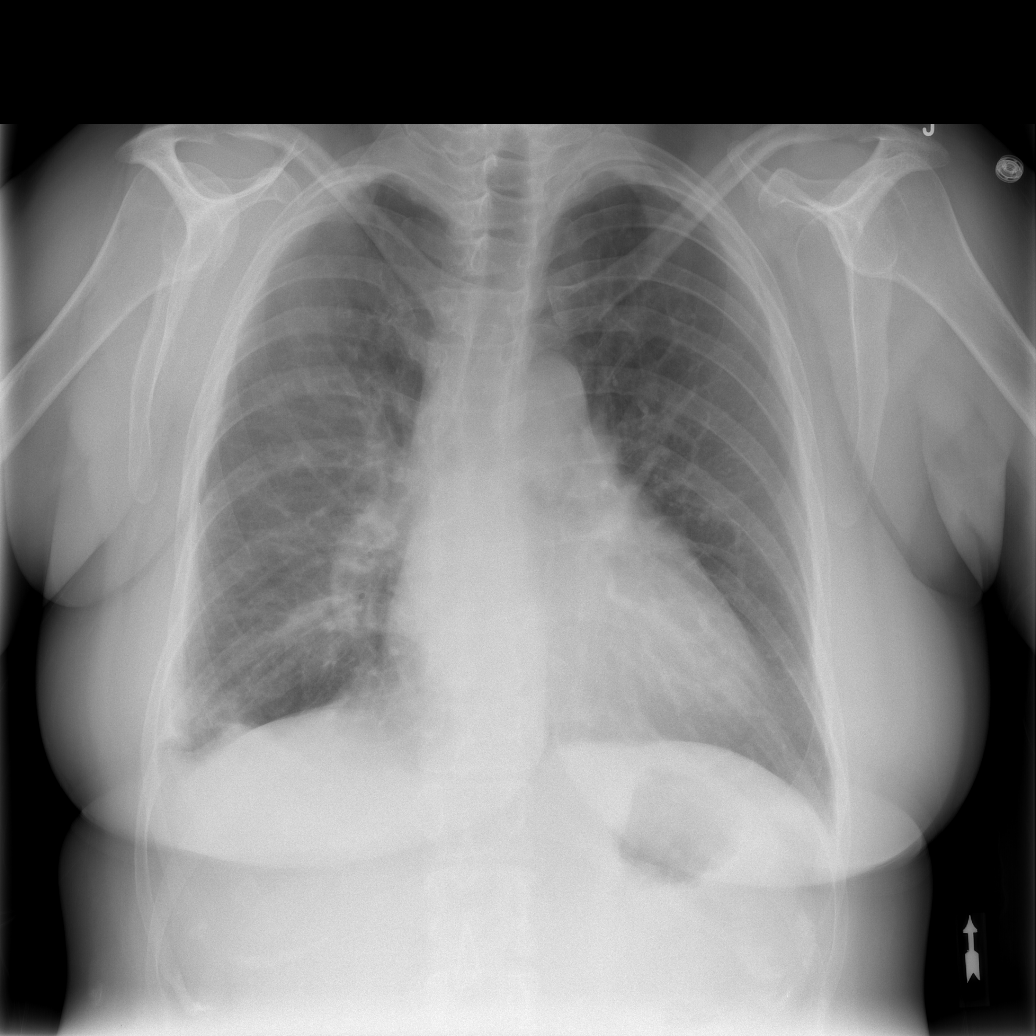

[w abdomen upright *]
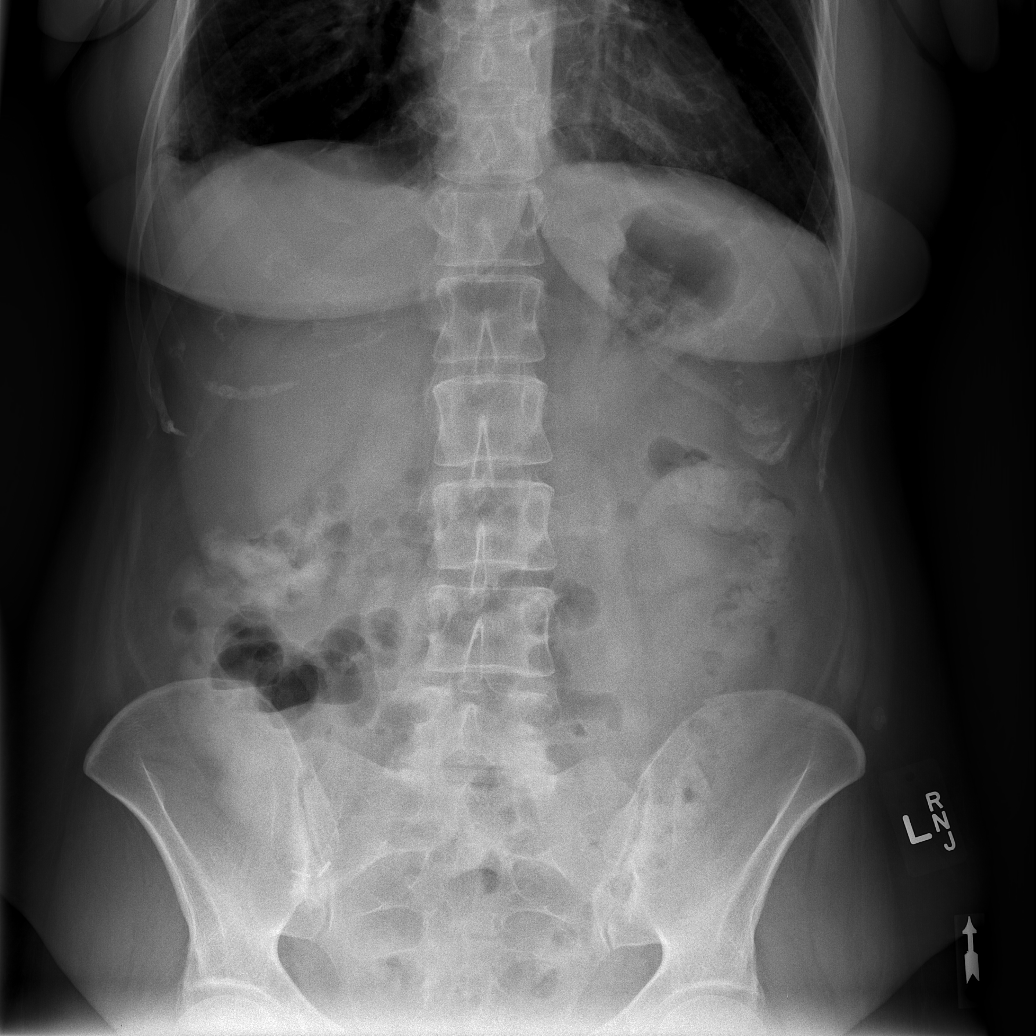

[t abdomen supine (1 of 2)]
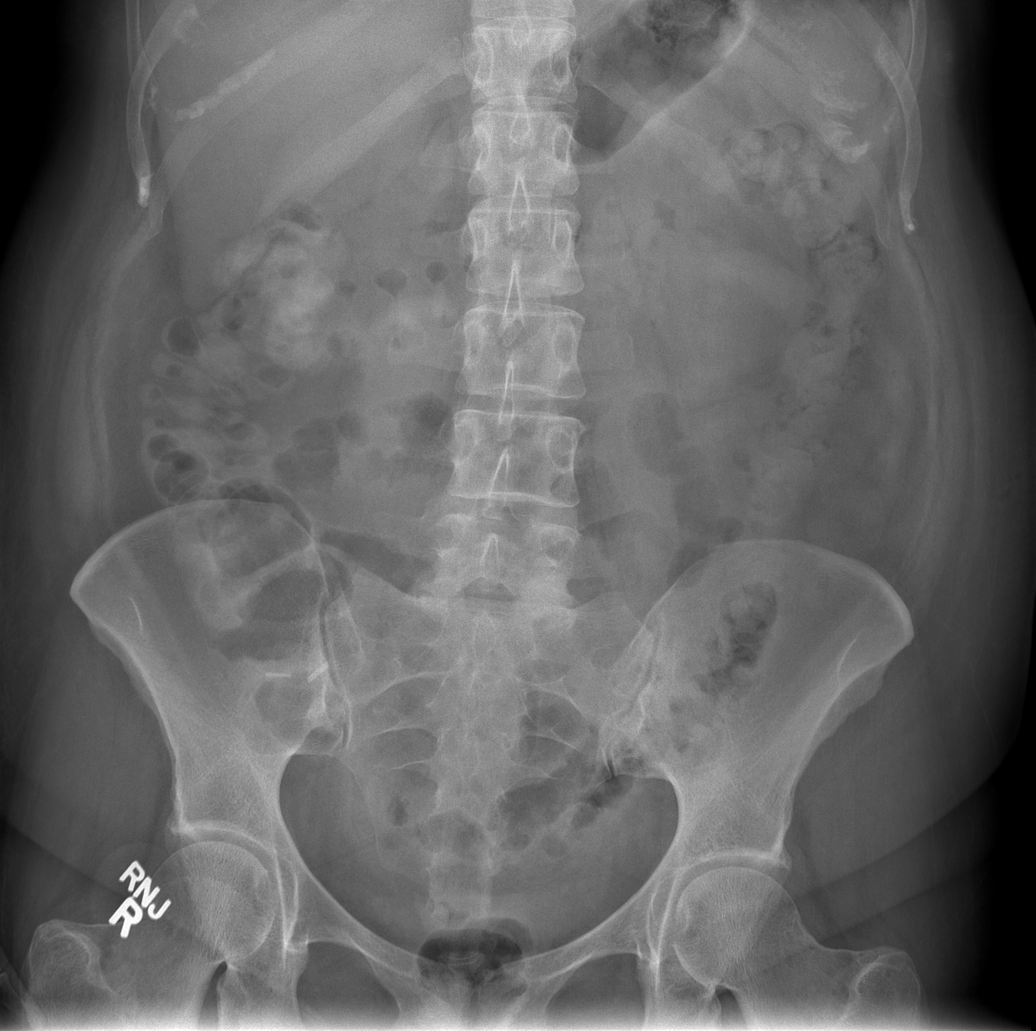

[t abdomen supine (2 of 2)]
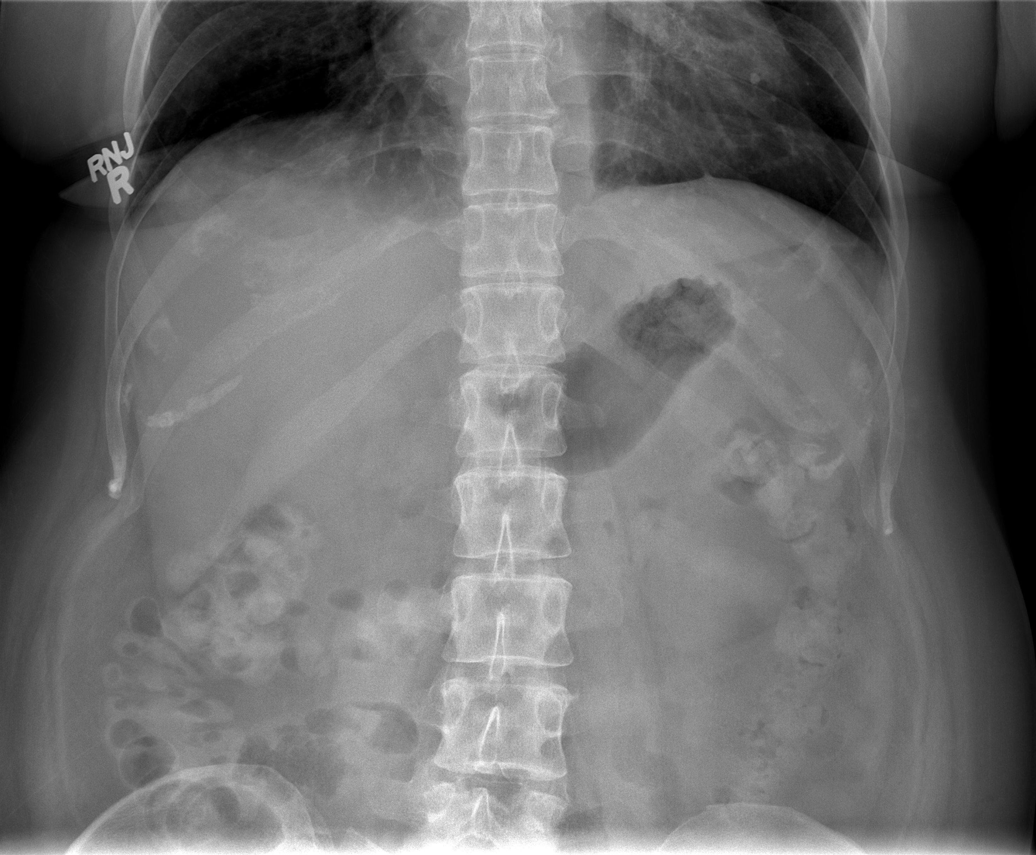

[4 of 4 positions shown; findings below may reference images not displayed]

FINDINGS: Chest film demonstrates right basilar airspace disease.
There is cardiomegaly and mild vascular congestion.  No
pneumothorax.  Small right effusion noted.

Two views of the abdomen show no free intraperitoneal air.  There
is no evidence of bowel obstruction.  No unexpected calcification.
IMPRESSION: 1.  Bibasilar airspace disease and small effusion worrisome for
early pneumonia.
2.  Benign appearing abdomen.

## 2010-08-25 ENCOUNTER — Other Ambulatory Visit (HOSPITAL_COMMUNITY): Payer: 59

## 2010-08-25 ENCOUNTER — Other Ambulatory Visit: Payer: Self-pay | Admitting: Internal Medicine

## 2010-08-25 LAB — COMPREHENSIVE METABOLIC PANEL
ALT: 9 U/L (ref 0–35)
AST: 20 U/L (ref 0–37)
CO2: 17 mEq/L — ABNORMAL LOW (ref 19–32)
Calcium: 7.9 mg/dL — ABNORMAL LOW (ref 8.4–10.5)
Chloride: 106 mEq/L (ref 96–112)
GFR calc non Af Amer: 5 mL/min — ABNORMAL LOW (ref >60)
Sodium: 136 mEq/L (ref 135–145)

## 2010-08-25 LAB — HEPATITIS B SURFACE ANTIGEN: Hepatitis B Surface Ag: NEGATIVE

## 2010-08-25 LAB — URINE CULTURE
Colony Count: NO GROWTH
Culture: NO GROWTH
Special Requests: NEGATIVE

## 2010-08-25 LAB — CBC
MCH: 27.8 pg (ref 26.0–34.0)
Platelets: 194 10*3/uL (ref 150–400)
RBC: 3.99 MIL/uL (ref 3.87–5.11)
WBC: 11.2 10*3/uL — ABNORMAL HIGH (ref 4.0–10.5)

## 2010-08-25 LAB — DIFFERENTIAL
Basophils Relative: 0 % (ref 0–1)
Eosinophils Absolute: 0.1 10*3/uL (ref 0.0–0.7)
Neutrophils Relative %: 81 % — ABNORMAL HIGH (ref 43–77)

## 2010-08-25 LAB — C4 COMPLEMENT: Complement C4, Body Fluid: 22 mg/dL (ref 10–40)

## 2010-08-25 LAB — CREATININE, URINE, RANDOM: Creatinine, Urine: 130.4 mg/dL

## 2010-08-26 ENCOUNTER — Inpatient Hospital Stay (HOSPITAL_COMMUNITY)
Admission: AD | Admit: 2010-08-26 | Discharge: 2010-09-01 | DRG: 391 | Disposition: A | Discharge status: Home / Self Care | Payer: 59 | Source: Other Acute Inpatient Hospital | Attending: Internal Medicine | Admitting: Internal Medicine

## 2010-08-26 LAB — RENAL FUNCTION PANEL
Albumin: 2.5 g/dL — ABNORMAL LOW (ref 3.5–5.2)
Calcium: 8.1 mg/dL — ABNORMAL LOW (ref 8.4–10.5)
Chloride: 108 mEq/L (ref 96–112)
Creatinine, Ser: 8.99 mg/dL — ABNORMAL HIGH (ref 0.50–1.10)
GFR calc non Af Amer: 5 mL/min — ABNORMAL LOW (ref >60)
Phosphorus: 5.8 mg/dL — ABNORMAL HIGH (ref 2.3–4.6)

## 2010-08-26 LAB — CBC
MCH: 27.7 pg (ref 26.0–34.0)
MCHC: 33.1 g/dL (ref 30.0–36.0)
MCV: 83.7 fL (ref 78.0–100.0)
Platelets: 200 10*3/uL (ref 150–400)
RDW: 13.1 % (ref 11.5–15.5)
WBC: 9.7 10*3/uL (ref 4.0–10.5)

## 2010-08-27 LAB — COMPREHENSIVE METABOLIC PANEL
ALT: 9 U/L (ref 0–35)
AST: 23 U/L (ref 0–37)
Calcium: 8.3 mg/dL — ABNORMAL LOW (ref 8.4–10.5)
Creatinine, Ser: 8.4 mg/dL — ABNORMAL HIGH (ref 0.50–1.10)
GFR calc Af Amer: 6 mL/min — ABNORMAL LOW (ref >60)
Sodium: 139 mEq/L (ref 135–145)
Total Protein: 7.1 g/dL (ref 6.0–8.3)

## 2010-08-27 LAB — CBC
MCH: 28.2 pg (ref 26.0–34.0)
MCHC: 34.8 g/dL (ref 30.0–36.0)
MCV: 81 fL (ref 78.0–100.0)
Platelets: 267 10*3/uL (ref 150–400)

## 2010-08-28 ENCOUNTER — Inpatient Hospital Stay (HOSPITAL_COMMUNITY): Payer: 59

## 2010-08-28 LAB — CBC
HCT: 31.8 % — ABNORMAL LOW (ref 36.0–46.0)
Hemoglobin: 11.1 g/dL — ABNORMAL LOW (ref 12.0–15.0)
MCH: 28 pg (ref 26.0–34.0)
MCHC: 34.9 g/dL (ref 30.0–36.0)
RDW: 13.1 % (ref 11.5–15.5)

## 2010-08-28 LAB — PROTEIN, URINE, 24 HOUR
Collection Interval-UPROT: 24 hours
Protein, 24H Urine: 168 mg/d — ABNORMAL HIGH (ref 50–100)

## 2010-08-28 LAB — ANTI-NUCLEAR AB-TITER (ANA TITER)

## 2010-08-28 LAB — BASIC METABOLIC PANEL
BUN: 55 mg/dL — ABNORMAL HIGH (ref 6–23)
Calcium: 7 mg/dL — ABNORMAL LOW (ref 8.4–10.5)
GFR calc Af Amer: 7 mL/min — ABNORMAL LOW (ref >60)
GFR calc non Af Amer: 6 mL/min — ABNORMAL LOW (ref >60)
Glucose, Bld: 87 mg/dL (ref 70–99)
Potassium: 3 mEq/L — ABNORMAL LOW (ref 3.5–5.1)

## 2010-08-28 LAB — MAGNESIUM: Magnesium: 1.5 mg/dL (ref 1.5–2.5)

## 2010-08-28 LAB — CREATININE, URINE, 24 HOUR
Collection Interval-UCRE24: 24 hours
Creatinine, 24H Ur: 1489 mg/d (ref 700–1800)
Creatinine, Urine: 62.06 mg/dL

## 2010-08-28 IMAGING — CR DG CHEST 1V PORT
1 series · 1 of 1 positions shown · non-contrast
Comparison: [DATE]; [DATE].

CLINICAL DATA: Pneumonia.

PORTABLE CHEST - 1 VIEW

[AP]
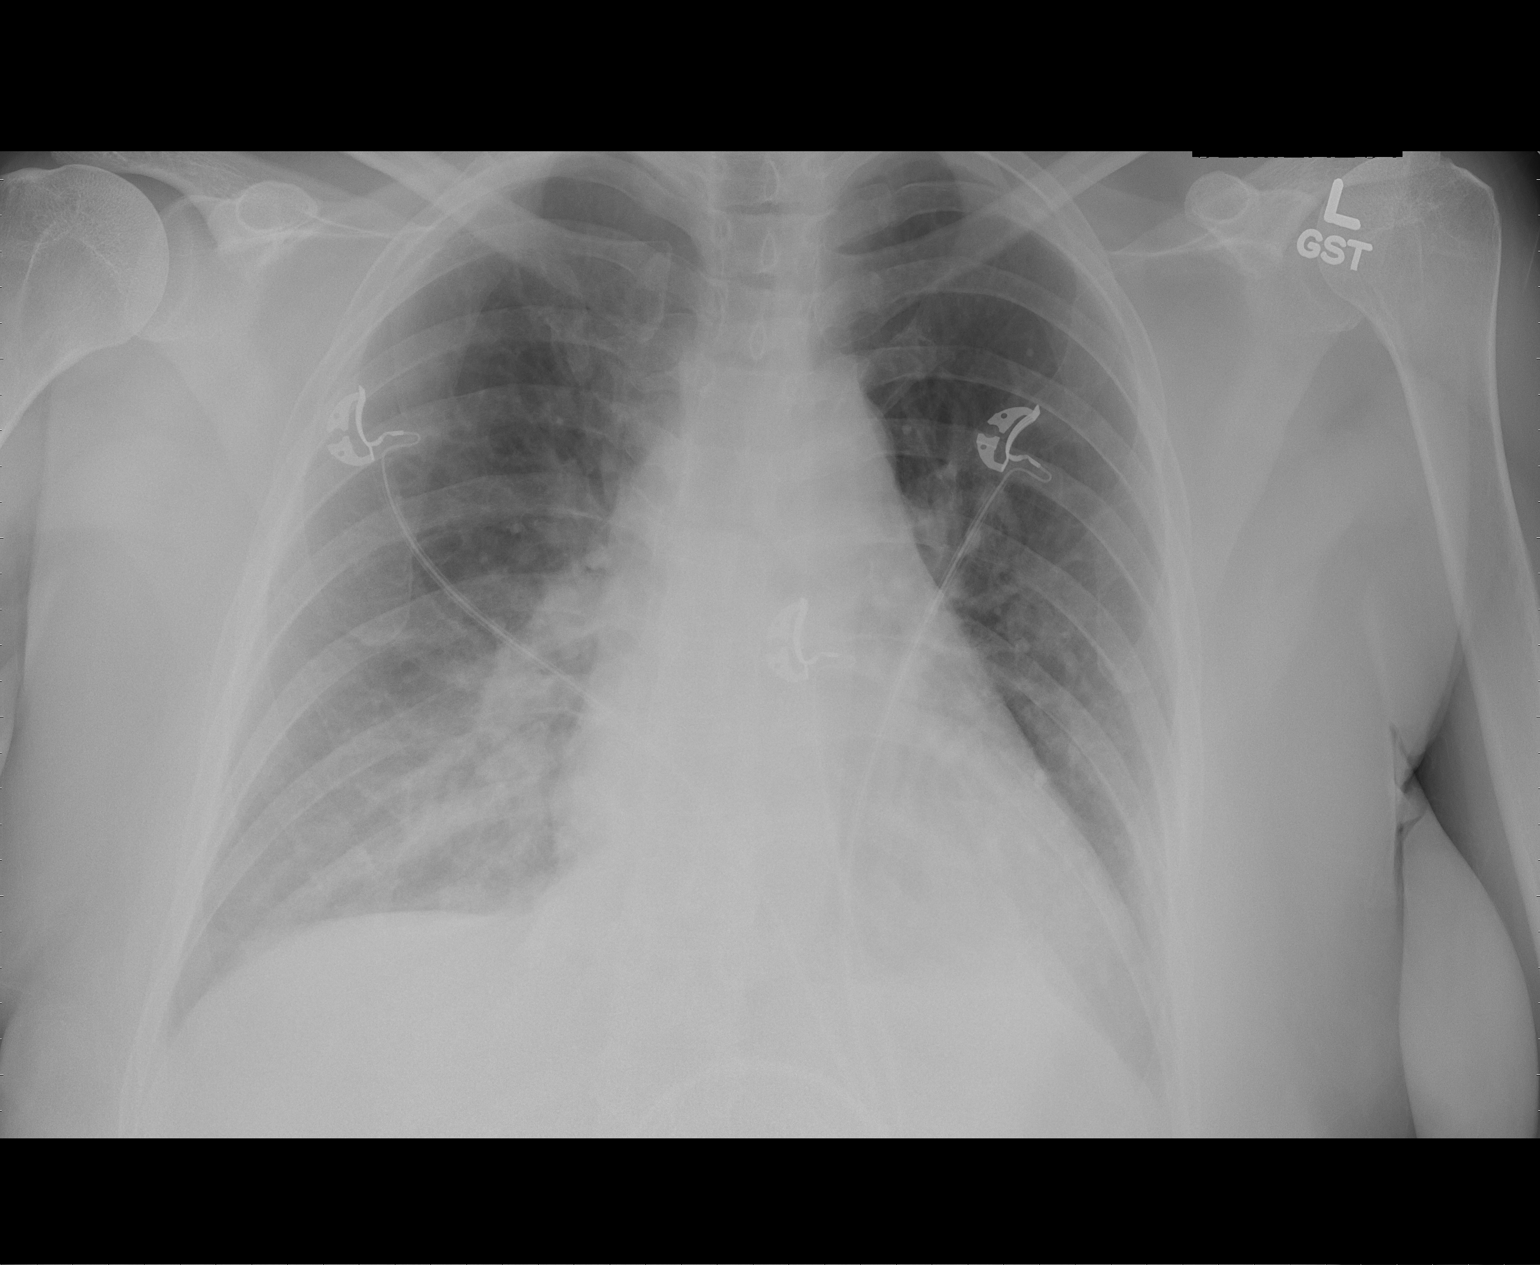

[1 of 1 positions shown; findings below may reference images not displayed]

FINDINGS: Unchanged cardiac silhouette and mediastinal contours.
Interval development of heterogeneous air space opacities within
the left lower lung and right mid lung.  Increased conspicuity of
the pulmonary interstitium without frank pulmonary edema.  No
definite pleural effusion or pneumothorax.  Unchanged bones.
IMPRESSION: Interval development of heterogeneous opacities within the right
mid and left lower lung.  While possibly atelectasis, given
clinical indication, this may represent multifocal infection.
Further evaluation may be obtained with a PA and lateral chest
radiograph may be obtained as clinically indicated.  A follow-up
chest radiograph in 4 to 6 weeks after treatment is recommended to
ensure resolution.

## 2010-08-29 LAB — RENAL FUNCTION PANEL
Albumin: 2.6 g/dL — ABNORMAL LOW (ref 3.5–5.2)
Chloride: 111 mEq/L (ref 96–112)
GFR calc Af Amer: 11 mL/min — ABNORMAL LOW (ref >60)
GFR calc non Af Amer: 9 mL/min — ABNORMAL LOW (ref >60)
Potassium: 3.1 mEq/L — ABNORMAL LOW (ref 3.5–5.1)
Sodium: 146 mEq/L — ABNORMAL HIGH (ref 135–145)

## 2010-08-29 LAB — CULTURE, BLOOD (ROUTINE X 2): Culture: NO GROWTH

## 2010-08-30 LAB — CBC
MCH: 27.9 pg (ref 26.0–34.0)
Platelets: 212 10*3/uL (ref 150–400)
RBC: 3.65 MIL/uL — ABNORMAL LOW (ref 3.87–5.11)
WBC: 8.8 10*3/uL (ref 4.0–10.5)

## 2010-08-30 LAB — RENAL FUNCTION PANEL
CO2: 23 mEq/L (ref 19–32)
Calcium: 6.3 mg/dL — CL (ref 8.4–10.5)
Chloride: 109 mEq/L (ref 96–112)
GFR calc Af Amer: 17 mL/min — ABNORMAL LOW (ref >60)
GFR calc non Af Amer: 14 mL/min — ABNORMAL LOW (ref >60)
Potassium: 3.5 mEq/L (ref 3.5–5.1)
Sodium: 142 mEq/L (ref 135–145)

## 2010-08-30 NOTE — H&P (Signed)
NAMESARRAH, FIORENZA              ACCOUNT NO.:  [id number]  MEDICAL RECORD NO.:  [id number]  LOCATION:  1414                         FACILITY:  [facility]  PHYSICIAN:  Ramiro Harvest, MD    DATE OF BIRTH:  [birth year]  DATE OF ADMISSION:  08/22/2010 DATE OF DISCHARGE:                             HISTORY & PHYSICAL   PRIMARY CARE PHYSICIAN:  Carola J. Gerri Spore, M.D. with [facility]  GASTROENTEROLOGIST:  Shirley Friar, MD of [facility].  PSYCHIATRIST:  Andee Poles, M.D.  NEUROLOGIST:  Melvyn Novas, M.D.  CHIEF COMPLAINT:  Nausea, vomiting, abdominal pain.  HISTORY OF PRESENT ILLNESS:  Kathleen Sweeney is a 50 year old Caucasian female with history of diverticulitis, last episode January 2012, history of major depressive disorder, history of migraine headaches, history of insomnia, history of hypothyroidism, presented to the ED with a 4-day history of lower abdominal pain, right lower quadrant greaterthan left lower quadrant.  The patient states that she was nauseous for the past 2 weeks.  Stated that 5 days prior to admission on the way home from out-of-town, had an episode of nonbloody emesis.  The patient stated that 4 days prior to admission at night, she started experiencing lower abdominal pain.  Described as intense in nature and "labor-like pains," which was nonradiating.  The patient states that she felt like she had a diverticulitis flare which was similar to what she had in January of 2012.  The patient subsequently presented to the PCP's office 2 days prior to admission and was seen by her PCP's partner and placed on Cipro and Flagyl for empiric treatment of diverticulitis.  The patient was able to take that for 1 day, however, 1 day prior to admission, the patient started experiencing worsening nausea, 4 episodes of nonbloody emesis and subsequently presented to the ED.  The patient does endorse decreased urinary output, low grade fever of 99.6,  chills, decreased oral intake, generalized weakness and complaining of some right-sided back pain.  The patient also has some intermittent chest pain that she states last for about 5 minutes that has been intermittent and occurring over the past year.  It is nonradiating, more left substernal and is described as a discomfort.  The patient denies any palpitations.  The patient states that in the ED she had a chest pain which is now associated with some burning sensation, however, is currently chest pain free at the time of this interview.  The patient also endorses some migraine headaches as well as insomnia.  The patient was seen in the ED.  CT scan which was done was consistent with mild diverticulitis.  Labs which were obtained did have a BMET with a creatinine of 4.04, otherwise was within normal limits and CBC with a white count of 12.2 with ANC of 9.8, otherwise was within normal limits. We are called to admit the patient for further evaluation and management.  ALLERGIES:  PENICILLIN and ERYTHROMYCIN.  The patient states that ERYTHROMYCIN cause her to feel like she is having a heart attack.  PAST MEDICAL HISTORY: 1. Status post excision of a benign spinal tumor in 1997. 2. Status post appendectomy. 3. Status post total abdominal hysterectomy and bilateral salpingo-  oophorectomy in November 19, 2006 per Dr. Richarda Overlie. 4. Major depressive disorder. 5. History of multidrug overdose including Xanax, Ambien and Phenergan     in 2011. 6. Postmenopausal. 7. Hypothyroidism. 8. Constipation. 9. Migraine headaches. 10.Prior history of diverticulitis. 11.Insomnia.  The patient states that she had a normal colonoscopy     done in March of this year.  MEDICATIONS: 1. Ciprofloxacin 500 mg p.o. b.i.d. 2. Flagyl 500 mg p.o. t.i.d. 3. Levothyroxine 112 mcg p.o. daily. 4. Ambien 12.5 mg p.o. q.h.s. 5. Zanaflex 6 mg p.o. daily.  FAMILY HISTORY:  Father deceased age 67 from  pneumonia.  Mother alive age 28 with history of diabetes, coronary artery disease, and diverticulitis.  Family history is also positive for hypertension.  SOCIAL HISTORY:  The patient does have a 45-pack-year tobacco history but quit in 2008.  No current tobacco use.  Occasional alcohol use.  No IV drug use.  REVIEW OF SYSTEMS:  As per HPI, otherwise negative.  PHYSICAL EXAMINATION:  VITAL SIGNS:  Temperature 98.5, blood pressure 106/77, pulse of 96, respirations 20, saturating 97% on room air. GENERAL:  The patient is well-developed, well-nourished female in no acute cardiopulmonary distress. HEENT:  Normocephalic, atraumatic.  Pupils equal, round and reactive to light and accommodation.  Extraocular movements intact.  Oropharynx is clear.  No lesions.  No exudates. NECK:  Supple.  No lymphadenopathy.  Dry mucous membranes. RESPIRATORY:  Lungs are clear to auscultation bilaterally. CARDIOVASCULAR:  Tachycardic, regular rhythm. ABDOMEN:  Soft, nondistended.  Positive bowel sounds.  Tender to palpation in the suprapubic region as well as the right lower quadrant greater than the left lower quadrant, right CVA tenderness. EXTREMITIES:  No clubbing, cyanosis or edema. NEUROLOGICAL:  The patient is alert and oriented x3.  Cranial nerves II through XII are grossly intact.  No focal deficits.  ADMISSION LABS:  Sodium 136, potassium 4.4, chloride 98, bicarb 26, glucose 109, BUN 22, creatinine 4.04, calcium of 10.1.  CBC with a white count of 12.2, hemoglobin 15.0, hematocrit 45.5, platelet count of 235,000 with an ANC of 9.8.  CT of the abdomen and pelvis shows wall thickening and inflammation at the rectosigmoid junction consistent with mild diverticulitis.  No abscess demonstrated.  Recommend followup evaluation of the colon after resolution of acute symptoms to this to exclude underlying colonic mass lesion.  ASSESSMENT AND PLAN:  Ms. Kathleen Sweeney is a 50 year old Caucasian female,  prior history of diverticulitis, history of major depressive disorder, hypothyroidism, insomnia, presenting to the ED with a 4-day history of nausea, vomiting, abdominal pain, also with some complaints of back pain and chest pain. 1. Acute diverticulitis.  The patient did have an episode in January     of 2011.  We will check blood cultures x2.  Check UA with cultures     and sensitivities.  Check hepatic panel.  Check lipase.  Check     amylase.  Place on a clear liquids.  Will place on IV Cipro and     Flagyl, antiemetics, IV fluids and supportive care will follow. 2. Acute renal failure, likely secondary to prerenal azotemia and     secondary to decreased oral intake and dehydration.  We will check     a fractional excretion of sodium.  Check a renal ultrasound.  Place     on IV fluids and follow.  If no improvement, we will consult with     Renal. 3. Dehydration.  Hydrate with IV fluids. 4. Chest pain.  Will  cycle cardiac enzymes q.8h. x3.  Check 2-D echo.     Check an EKG.  Check lipase and amylase.  Check hepatic panel and     will follow. 5. Hypothyroidism.  Check TSH.  Continue home dose Synthroid. 6. Major depressive disorder, stable. 7. Back pain, rule out pyelonephritis.  Will check UA with cultures     and sensitivities.  Place on IV Cipro, supportive care and     symptomatic treatment and follow. 8. Insomnia.  Continue home dose Ambien and Zanaflex. 9. Prophylaxis.  Protonix for GI prophylaxis.  Lovenox for DVT     prophylaxis.  It has been a pleasure taking care of Ms. [name].     Ramiro Harvest, MD     DT/MEDQ  D:  08/23/2010  T:  08/23/2010  Job:  [id number]  cc:   Otilio Connors. Gerri Spore, M.D. Fax: [phone]  Shirley Friar, MD Fax: [phone]  Andee Poles, M.D. Fax: [phone]  Melvyn Novas, M.D. Fax: [phone]  Electronically Signed by Ramiro Harvest MD on 08/30/2010 06:35:37 PM

## 2010-08-31 LAB — BASIC METABOLIC PANEL
CO2: 25 mEq/L (ref 19–32)
Calcium: 6.6 mg/dL — ABNORMAL LOW (ref 8.4–10.5)
GFR calc non Af Amer: 20 mL/min — ABNORMAL LOW (ref >60)
Sodium: 144 mEq/L (ref 135–145)

## 2010-08-31 LAB — CBC
MCH: 28.3 pg (ref 26.0–34.0)
Platelets: 201 10*3/uL (ref 150–400)
RBC: 3.96 MIL/uL (ref 3.87–5.11)

## 2010-09-01 DIAGNOSIS — F329 Major depressive disorder, single episode, unspecified: Secondary | ICD-10-CM

## 2010-09-01 LAB — RENAL FUNCTION PANEL
Albumin: 2.8 g/dL — ABNORMAL LOW (ref 3.5–5.2)
GFR calc Af Amer: 28 mL/min — ABNORMAL LOW (ref >60)
GFR calc non Af Amer: 23 mL/min — ABNORMAL LOW (ref >60)
Phosphorus: 3.1 mg/dL (ref 2.3–4.6)
Potassium: 3.6 mEq/L (ref 3.5–5.1)
Sodium: 142 mEq/L (ref 135–145)

## 2010-09-05 NOTE — Consult Note (Signed)
Kathleen Sweeney, Kathleen Sweeney              ACCOUNT NO.:  [id number]  MEDICAL RECORD NO.:  [id number]  LOCATION:  5509                         FACILITY:  [facility]  PHYSICIAN:  Eulogio Ditch, MD DATE OF BIRTH:  1960/11/30  DATE OF CONSULTATION:  09/01/2010 DATE OF DISCHARGE:                                CONSULTATION   REASON FOR CONSULTATION:  Depression, family concern that the patient can overdose.  HISTORY OF PRESENT ILLNESS:  A 50 year old Caucasian female who was admitted because of abdominal pain and was diagnosed with mild diverticulitis on CT scan.  When I saw the patient and told the reason of why I am seeing her, the husband was also present in the room and he said we never said that, that we were concerned that she is going to overdose.  She is not depressed.  She is came here for the abdominal pain.  Husband told me that it was very inappropriate to say these kind of things that she is going to kill herself.  In the past, she overdosed not to kill herself because she has a problem of insomnia and she took over medication because she was unable to sleep for many days.  Currently, the patient is following a neurologist for insomnia and recently started on the Ambien and she is sleeping better on it.  The patient reported that she follows Emerson Monte for depression, but currently not on any medications.  SUBSTANCE ABUSE HISTORY:  The patient denies abusing alcohol or illicit drugs.  FAMILY PSYCH HISTORY:  The patient denied any history of bipolar disorder, schizophrenia or depression in the family.  ALLERGIES:  The patient is allergic to PENICILLIN and ERYTHROMYCIN.  PAST MEDICAL HISTORY: 1. History of hypothyroidism. 2. Migraine headaches. 3. Insomnia. 4. Diverticulitis. 5. Post excision of benign spinal tumor in 1997.  CURRENT PSYCH MEDICATIONS:  The patient is on Ambien 12.5 mg p.o. nightly for insomnia.  SOCIAL HISTORY:  The patient lives with the  husband.  Husband is retired, but they have a form in [place], the cattle farm and they travel together.  They have 2 sons.  They are both doing very well as per them and one of the son is getting married next year.  The patient denied any financial stress in the family at this time.  MENTAL STATUS EXAM:  The patient is very calm, cooperative during interview.  Fair eye contact.  No psychomotor agitation or retardation noted during the interview.  Speech normal in rate, rhythm and volume. Mood euthymic.  Affect mood congruent.  Thought process:  Logical and goal directed.  Thought content:  Not suicidal or homicidal, not delusional, not internally preoccupied, not hallucinating.  Cognition: Alert, awake, oriented x3.  Memory immediate, recent remote fair. Attention and concentration fair.  Abstraction ability good.  Insight and judgment intact.  DIAGNOSES:  AXIS I:  Depressive disorder, not otherwise specified, rule out major depressive disorder, recurrent type. AXIS II:  Deferred. AXIS III:  See medical notes. AXIS IV:  Admitted in the hospital because of diverticulitis. AXIS V:  60.  RECOMMENDATIONS: 1. The patient at this time is cleared from Psychiatry for discharge.     The patient can  follow Dr. Emerson Monte in the outpatient     setting. 2. The patient can be continued on Ambien at bedtime.  Thanks for involving me in taking care of this patient.     Eulogio Ditch, MD     SA/MEDQ  D:  09/01/2010  T:  09/01/2010  Job:  [id number]  Electronically Signed by Eulogio Ditch  on 09/05/2010 09:50:00 AM

## 2010-09-06 NOTE — Discharge Summary (Addendum)
[name], Sweeney              ACCOUNT NO.:  [id number]  MEDICAL RECORD NO.:  [id number]  LOCATION:  5509                         FACILITY:  [facility]  PHYSICIAN:  Thad Ranger, MD       DATE OF BIRTH:  1960-08-10  DATE OF ADMISSION:  08/26/2010 DATE OF DISCHARGE:  09/01/2010                              DISCHARGE SUMMARY   PRIMARY CARE PHYSICIAN:  Carola J. Gerri Spore, MD  GASTROENTEROLOGIST:  Shirley Friar, MD  PSYCHIATRIST:  Andee Poles, MD  DISCHARGE DIAGNOSES: 1. Acute mild diverticulitis. 2. Acute renal failure secondary to nonsteroidal anti-inflammatory     drugs. 3. Healthcare-associated pneumonia. 4. Severe anxiety episode.  Kathleen rest of her diagnoses are consistent with her past medical history and include: 1. Chronic depressive disorder, not otherwise specified. 2. Migraines. 3. Insomnia.  CONSULTS: 1. James L. Deterding, MD from renal service on August 24, 2010 2. Eulogio Ditch, MD from Psychiatry on September 01, 2010.  CONDITION AT TIME OF DISCHARGE:  Kathleen Sweeney is alert and oriented.  She is slightly anxious, repeat that she is ready for discharged to home. She is no longer vomiting.  She is eating, having good bowel movements. She appears much improved.  HISTORY AND BRIEF HOSPITAL COURSE:  Kathleen Sweeney is a 50 year old Caucasian female with a history of diverticulitis as well as major depressive disorder and migraines.  She came to [facility] emergency department feeling very nauseated and having a 4-day history of lower abdominal pain. Two days prior to admission, Kathleen Sweeney had been to her PCP's office with her abdominal pain and was prescribed Cipro and Flagyl for empiric treatment of diverticulitis.  She experienced worsening nausea and had four episodes of emesis and came to [facility] ED.  Kathleen Sweeney endorsed decreased urinary output and low-grade fevers as well as generalized weakness and some right-sided back pain.  Kathleen Sweeney was admitted  to [facility] with acute diverticulitis, for that, she was started on IV Cipro and Flagyl. 1. Acute renal failure, thought originally to be secondary to     dehydration.  Kathleen Sweeney had a renal ultrasound done on August 22, 2009 that showed no renal lesion, no hydronephrosis. 2. Chest pain.  On Kathleen day of admission, Kathleen Sweeney's creatinine was     4.04.  Unfortunately over Kathleen course of 3 or 4 days, her creatinine     worsened 8.99.  She was seen by Dr. Beryle Lathe on August 24, 2010.  His impression was Kathleen Sweeney's acute renal failure was     caused by heavy NSAID use over period of years in addition to acute     dehydration from nausea and vomiting.  [facility] followed     along throughout Kathleen Sweeney's hospitalization.  Fortunately over     Kathleen period of few days, her creatinine decreased and she was able     to avoid hemodialysis.  Kathleen Sweeney understands that she is not to     take Advil or NSAIDs going forward. 3. Acute diverticulitis.  As mentioned that Kathleen Sweeney had been     started on Cipro and Flagyl  prior to admission.  It is my belief     that Kathleen Sweeney is unable to tolerate oral Flagyl and consequently     it worsened her vomiting.  She was placed on IV medications in [facility]     hospital.  CT of her abdomen and pelvis done on August 23, 2010,     showed wall thickening and inflammation at Kathleen rectosigmoid     junction consistent with mild diverticulitis.  Over Kathleen course of     her hospitalization, Kathleen Sweeney's abdominal pain has resolved.     She is able to eat solid food without pain or vomiting and she is     having good bowel movements, although she does complain that they     are greenish-black in color. 4. Healthcare-associated pneumonia.  Initially, Kathleen Sweeney's white     count was not elevated, it was 8.8 and her initial x-ray did not     show signs of pneumonia; however, at Kathleen time, she was dehydrated.     She continued to vomit  and Kathleen following day on August 24, 2010, a     followup x-ray showed bibasilar airspace disease with small     effusion worrisome for early pneumonia.  Further on August 28, 2010,     Kathleen Sweeney had a followup x-ray that shows interval development of     heterogeneous opacities within Kathleen right and mid left lower lung.     There was concern that this may represent multifocal infection.  It     was recommended that a followup chest x-ray be obtained 4-6 weeks     after treatment to ensure entire resolution.  Kathleen Sweeney was     treated with IV vancomycin for 4 days during her hospital stay,     this was in addition to Kathleen Cipro and Flagyl that she was admitted     with and Kathleen Primaxin that she was initially treated with.  Kathleen     Primaxin was changed to vancomycin, which yesterday was changed to     oral Levaquin.  Kathleen Sweeney will be discharged home with four more     days of oral Levaquin providing for a total antibiotic treatment of     14 days.  Currently, Kathleen Sweeney is not having any difficulty     breathing.  Her O2 sats are 98%.  She does not have a cough.  She     is not having any chest pain.  It appears that her healthcare-     associated pneumonia is resolving nicely. 5. Acute anxiety episode.  Kathleen Sweeney was steadily improving;     however, on August 30, 2010, she took an unexpected turn, became very     demanding, was refusing blood draws.  She was demanding IV Ativan     and Phenergan.  She became rather belligerent and at one point was     going to leave against medical advice if Kathleen drug that she wanted     would not be administered.  Kathleen Sweeney was treated with some IV     Ativan and Psychiatry was called.  Psychiatry felt that Kathleen Sweeney     at Kathleen time of their exam was calm and cooperative.  Her insight     and judgment were intact.  They diagnosed her with depressive     disorder, not otherwise specified, and advised that she follow up     at  discharge with Dr.  Nolen Mu.  They also recommended that she     could be discharged on Ambien at bedtime, but did not recommend     that she be discharged on any benzodiazepines.  Today at Kathleen time     of discharge, Kathleen Sweeney is feeling much better, smiling at me.     She is slightly tremulous, tells me repeatedly that she is ready to     go home.  PHYSICAL EXAMINATION:  VITAL SIGNS:  Temperature 98.4, pulse 71, respirations 18, blood pressure 112/79. HEENT:  Head is atraumatic and normocephalic.  Eyes are hyperemic. Pupils are equal, round, and reactive to light.  Nose shows no nasal discharge or exterior lesions.  Mouth has moist mucous membranes with good dentition. NECK:  Supple with midline trachea.  No JVD.  No lymphadenopathy. CHEST:  Demonstrates no accessory muscle use.  She has no wheezes or crackles to my exam. HEART:  Has regular rate and rhythm without obvious murmurs, rubs or gallops. ABDOMEN:  Soft, nontender, and nondistended with good bowel sounds. EXTREMITIES:  Show no clubbing, cyanosis, or edema.  She has good movement in each extremity. NEUROLOGIC:  Cranial nerves II through XII appear grossly intact.  She has no facial asymmetries, no obvious focal neuro deficits. PSYCHIATRIC:  Kathleen Sweeney is alert and oriented.  She is slightly anxious and concerned about going home.  She described some symptoms where she has numbness that starts in her face and traveled down to her toes, it starts at 4 a.m. every morning and lasts until 7 a.m.  PERTINENT LABORATORY DATA:  Her last studied CBC was on August 31, 2010, she had a white count 8.8, hemoglobin 11.2, hematocrit 32.4, platelets 201.  Her BMET was taken this morning; sodium 142, potassium 3.6, chloride 106, bicarb 26, glucose 53, BUN 15, creatinine 2.22.  Serum albumin 2.8, calcium 6.9, phosphorus 31.  On August 27, 2010, she did have a positive antinuclear antibody; however, she also had a negative antinuclear antibody titer.  Urine  cultures were obtained and showed no growth as of August 25, 2010, this was final.  Blood cultures were also obtained and after 5 days showed no growth, final.  Initially, Kathleen Sweeney had a set of three cardiac enzymes during her first 24 hours of hospital stay.  These were all well within normal limits.  Her TSH was found to be 10.183.  RADIOLOGICAL EXAM:  Kathleen Sweeney did have a renal ultrasound on August 23, 2010, that showed no renal lesion evident, no hydronephrosis.  Urinary bladder is decompressed, and splenic granuloma calcifications.  She had a CT of abdomen and pelvis that same date that showed wall thickening and inflammation of Kathleen rectosigmoid junction consistent with mild diverticulitis.  No abscess identified.  Her last chest x-ray was on August 28, 2010, that did show interval development of heterogeneous opacities within Kathleen right, mid, and left lower lung.  DISCHARGE MEDICATIONS: 1. Levofloxacin 750 mg one tablet every other day, take for 4 days. 2. Ambien 12.5 mg one tablet by mouth daily at bedtime. 3. Levothyroxine 112 mcg one tablet by mouth every morning. 4. Prenatal vitamins one tablet by mouth daily. 5. Vivelle-Dot 0.1 mg one patch every 3 days, change every 3 days. 6. Zanaflex one tablet by mouth daily at bedtime. 7. Stop taking Advil.  DISCHARGE INSTRUCTIONS:  Kathleen Sweeney will be discharged to home in Kathleen care of her husband. Activity level will be as tolerated to be increased slowly.  Diet has no restrictions. Followup appointments include a visit with Dr. Gerri Spore on September 06, 2010 at 12 p.m., also Dr. Charlott Rakes on September 05, 2010, also Dr. Andee Poles, Kathleen Sweeney needs to call for an appointment to be seen within 2 weeks.  SUGGESTIONS FOR OUTPATIENT FOLLOWUP: 1. Kathleen Sweeney needs a followup chest x-ray in 4-6 weeks to ensure     resolution of her lung opacities. 2. Kathleen Sweeney's TSH during this hospitalization was high over 10.     Consequently,  she will need outpatient followup to see if her     Synthroid needs to be adjusted. 3. Kathleen Sweeney had a low potassium during this hospitalization.  Her     potassium level is 3.6 today, but is recommended that she have a     BMET at her appointment with Dr. Gerri Spore to ensure that it is     stable and to ensure that her creatinine continues to decline.     Stephani Police, PA   ______________________________ Thad Ranger, MD    MLY/MEDQ  D:  09/01/2010  T:  09/01/2010  Job:  [id number]  cc:   Otilio Connors. Gerri Spore, M.D. Shirley Friar, MD Andee Poles, M.D.  Electronically Signed by Algis Downs PA on 09/04/2010 09:55:48 PM Electronically Signed by Andres Labrum RAI  on 09/06/2010 12:58:10 PM

## 2010-09-09 NOTE — Consult Note (Signed)
Kathleen Sweeney, Kathleen Sweeney              ACCOUNT NO.:  [id number]  MEDICAL RECORD NO.:  [id number]  LOCATION:  1414                         FACILITY:  [facility]  PHYSICIAN:  Sherrye Puga L. Sinan Tuch, M.D.DATE OF BIRTH:  1960-02-24  DATE OF CONSULTATION: DATE OF DISCHARGE:                                CONSULTATION   Nephrology Consultation  CONSULTING PHYSICIAN:  [facility].  REASON FOR CONSULTATION:  Acute kidney injury, question chronic kidney disease.  HISTORY OF PRESENT ILLNESS:  This is a 50 year old female with a history of severe major depressive disorder, overdose x2 in 2001 and 2011, requiring psychiatric hospitalization; history of hypothyroidism; history of chronic migraines, which she takes over 6 ibuprofen a day, who presented here on August 23, 2010, with 4 days of progressive abdominal pain, primarily in left lower quadrant with poor p.o. intake. She had only vomited 3 times before she came to the emergency room.  She had been chronically constipated, having no diarrhea.  She vomited 4 times in the ER.  History of diverticulosis in January 2012.  She had been on for 2 days because of the pain and irritation, and pain and discomfort been on Flagyl and Cipro from her primary doctor.  Her bowels have now arrived since Jul 13, 2010, in primary constipation and bloating with that.  She has had left lower quadrant pain.  Her left lower quadrant pain is intense in nature.  However, after an ultrasound, she had left flank pain.  She had UTIs in the distant past, but not recently.  No history of stones.  No history of renal disease in her family.  No family history of stones.  No family history of hearing defects, musculoskeletal defects, eye defects.  Her mother has hypertension and diabetes.  MEDICATIONS:  She has been on Cipro and Flagyl 2 days prior to admission.  Her other medicines that she has been at home include: 1. Levothyroxine 112 mcg a day. 2. Ambien 12.5 mg  h.s. 3. Zanaflex 6 mg a day.  PAST MEDICAL HISTORY:  Her past medical history includes the above. Other medical illnesses include; 1. Sleep disorder.  Sleep disorder or major insomnia. 2. She is also postmenopausal. 3. She has chronic constipation.  PAST SURGICAL HISTORY: 1. Benign spinal tumor in 1997. 2. History of TAH-BSO by Dr. Marcelle Overlie in July 2008. 3. History of a cyst removed from her mouth. 4. History of an appendectomy in the past.  She has 2 children, who are healthy.  FAMILY HISTORY:  Father died at age 38, pneumonia, he also had an aneurysm.  Mother is age 58, has diabetes, hypertension, coronary artery disease, and also apparently diverticulitis.  SOCIAL HISTORY:  She has 45-pack-year history of smoking, but quit 4 years ago.  No alcohol use.  No IV drug abuse.  REVIEW OF SYSTEMS:  HEENT:  The headaches as mentioned above.  She has poor vision until this evening.  No sores or dryness.  She does get dry mouth.  No hearing deficits.  PULMONARY:  She denies asthma or hay fever.  She feels like she got wheezing today.  GI:  As listed above. No history of hepatitis or jaundice.  SKIN:  Unremarkable.  MUSCULOSKELETAL:  Some pain in her knees and her back at this time. NEUROLOGICAL:  There is still depression.  OBJECTIVE PHYSICAL EXAMINATION:  GENERAL:  She is pale, depressed affect. VITAL SIGNS:  Temperature 97.6, blood pressure supine is 142/74, going to 144/82; heart rate goes from 78 to 84. HEENT:  Fundi unremarkable.  Pharynx unremarkable. NECK:  Without masses or thyromegaly. LUNGS:  Reveal decreased breath sounds, occasional rhonchi, and crackles in the bases. CHEST:  Normal expansion.  Normal percussion. CARDIOVASCULAR:  Regular rhythm.  Grade 1-2/6 systolic ejection murmur best heard at left lower sternal border.  PMI at 10 cm lateral to the midsternal line in 5th intercostal space.  He has 2+ edema.  Dorsalis pedal pulses are 2+ and 4+ are the rest of her  pulses.  No bruits, lifts, heaves, or thrills. ABDOMEN:  Bowel sounds are present, but markedly decreased, mild-to- moderate distention.  Tender diffusely more on left than on the right, but it is really hard to tell she really has a lot of withdrawal. SKIN:  Somewhat pale.  LABORATORY DATA:  Hemoglobin was 15 yesterday on admit, it is 11.3 today; white count 12.2 yesterday, 8.8 today; platelets 235,000 on admit, 196,000 today.  Urine sodium 57; creatinine of 4.44 on admission, 6.54 today; potassium 4.4 yesterday, 5.3 today; bicarb of 26 yesterday, 20 today; albumin 2.7 yesterday, 2.5 today.  Urinalysis 100 mg of serum protein, 7-10 white cells, 7-10 red cells.  Creatinine on March 13, 2009, was 1.02.  ASSESSMENT: 1. Acute kidney injury, question some chronic kidney disease.  Volume     depletion in the setting of nonsteroidals, where she cannot     accommodate her renal blood flow or adjust her glomerular pressure     because of the prostate gland inhibition, and it has probably led     to this problem.  Cannot rule out acute interstitial disease     related to the nonsteroidals.  Also, cannot rule out papillary     necrosis.  The nonsteroidals clearly are one of the primary factors     here.  Some volume depletion may have been playing some role, but     not the largest role.  Complications here include acidemia, mild     hyperkalemia, volume excess and now oliguria.  Need to rule out     urinary tract infection, we are ready to consider this 3     eosinophils in her urine.  Suspect we will need dialysis if not     improving within 24 hours. 2. Diverticulitis. 3. Migraines. 4. Chronic major depressive disorder.  PLAN: 1. Decrease the IV fluids. 2. Urine sodium creatinine. 3. Urine for eosinophils. 4. Repeat ultrasound. 5. C3 and C4.          ______________________________ [employer] Kaito Schulenburg, M.D.     JLD/MEDQ  D:  08/24/2010  T:  08/24/2010  Job:   [id number]  Electronically Signed by Beryle Lathe M.D. on 09/09/2010 03:24:03 PM

## 2010-10-04 ENCOUNTER — Other Ambulatory Visit: Payer: Self-pay | Admitting: Family Medicine

## 2010-10-04 ENCOUNTER — Ambulatory Visit
Admission: RE | Admit: 2010-10-04 | Discharge: 2010-10-04 | Disposition: A | Discharge status: Home / Self Care | Payer: 59 | Source: Ambulatory Visit | Attending: Family Medicine | Admitting: Family Medicine

## 2010-10-04 DIAGNOSIS — J189 Pneumonia, unspecified organism: Secondary | ICD-10-CM

## 2010-10-04 IMAGING — CR DG CHEST 2V
2 series · 2 of 2 positions shown · non-contrast
Comparison: Portable chest x-ray of [DATE]

CLINICAL DATA: History of recent pneumonia

CHEST - 2 VIEW

[w chest pa]
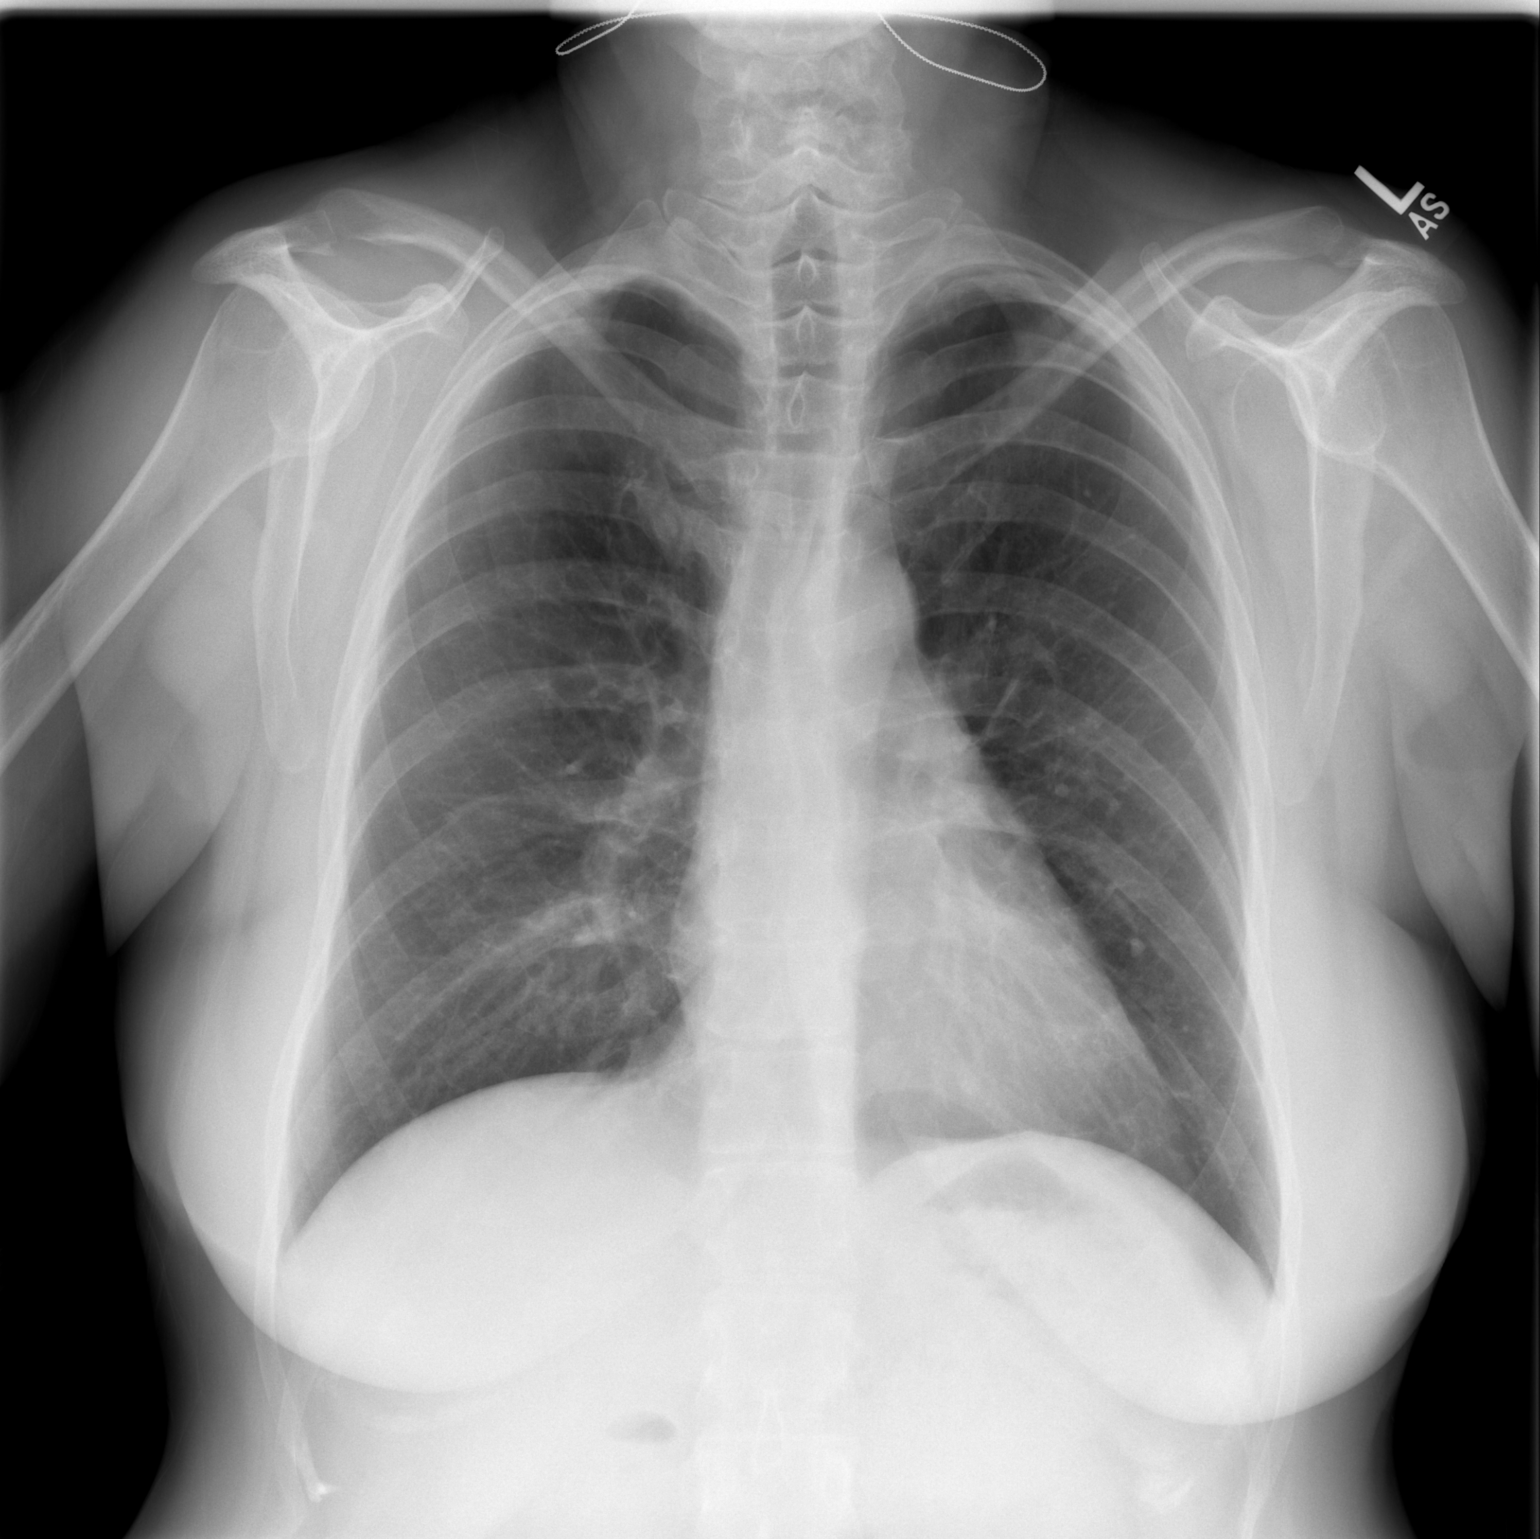

[w chest lat]
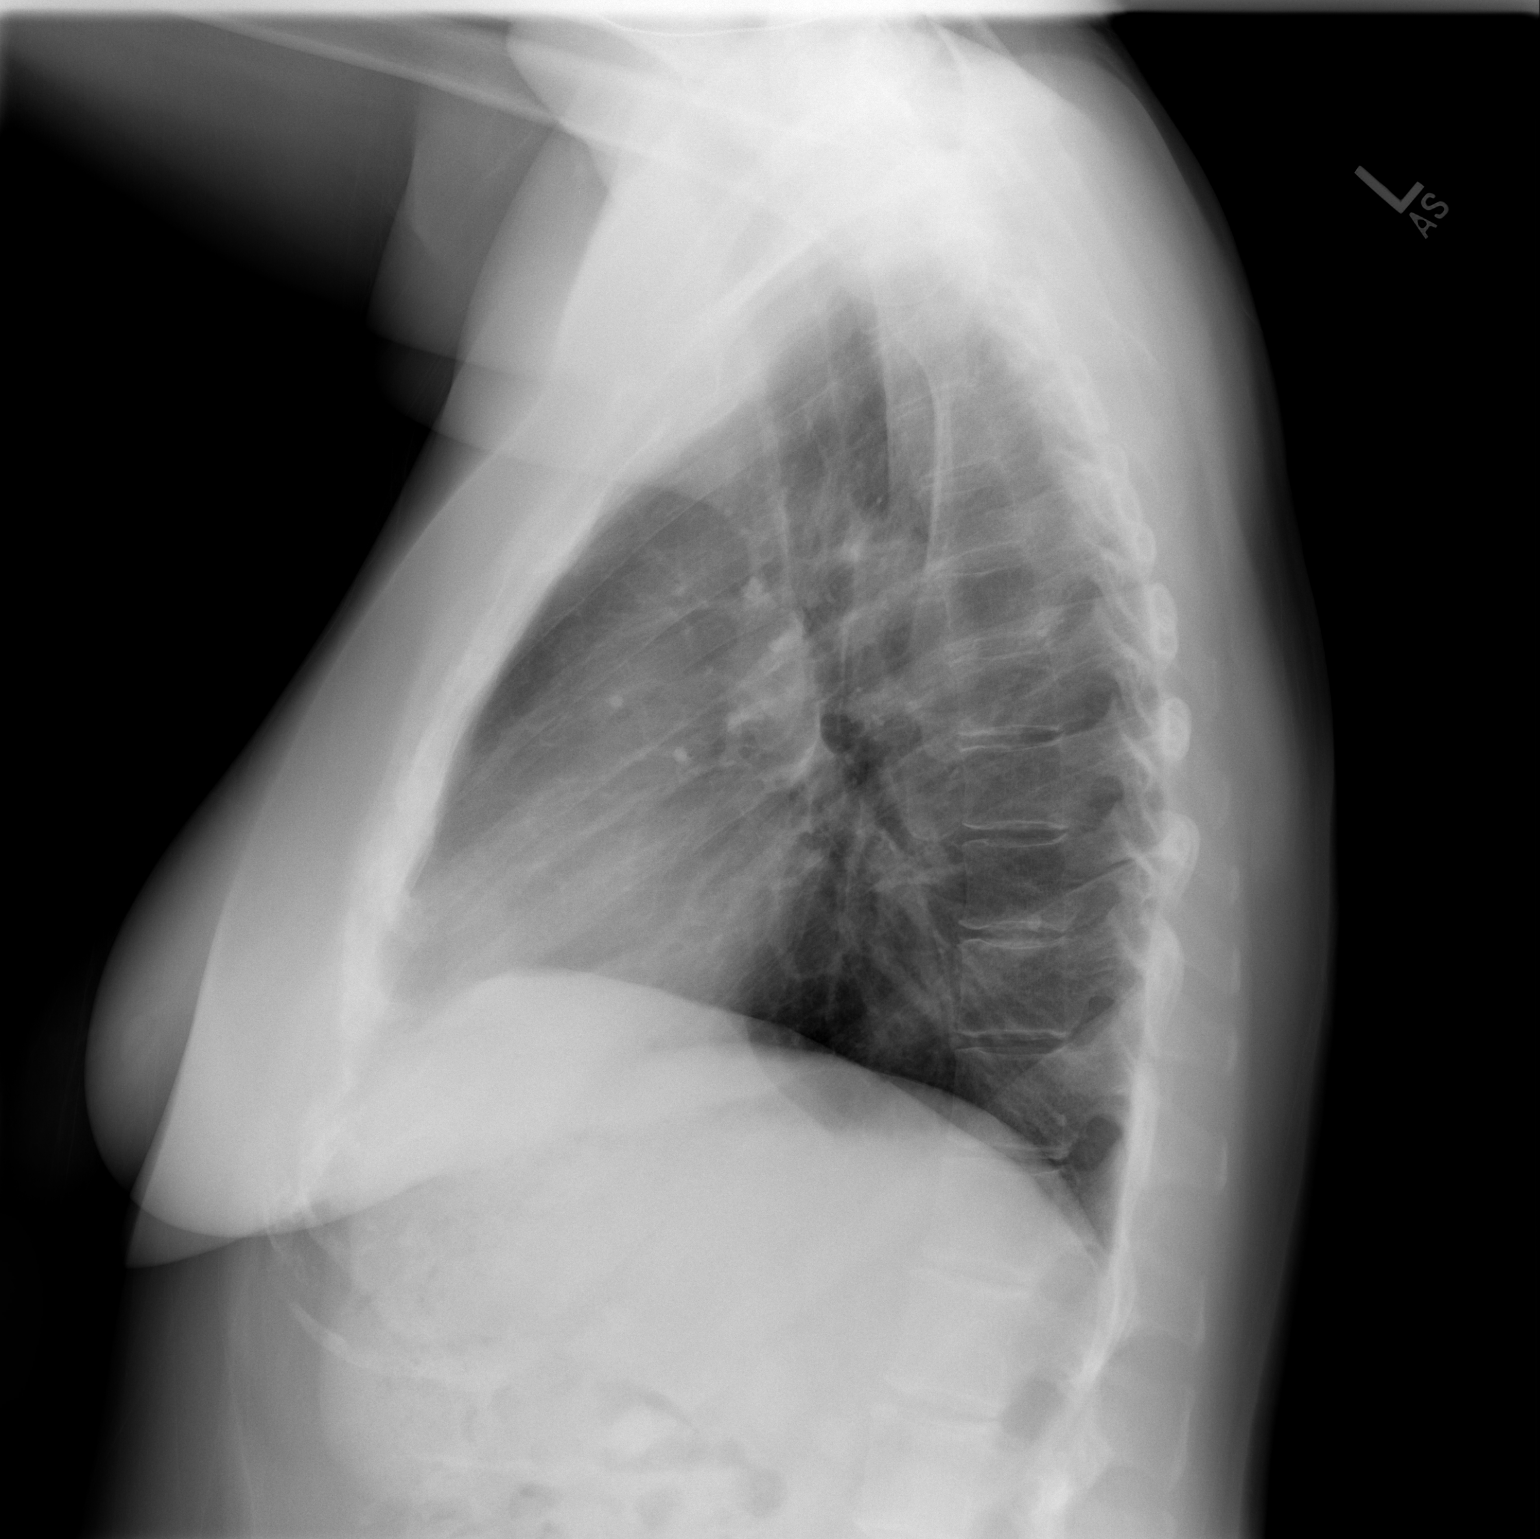

[2 of 2 positions shown; findings below may reference images not displayed]

FINDINGS: No infiltrate or effusion is seen.  Mediastinal contours
appear normal.  The heart is within normal limits in size.  No bony
abnormality is noted.
IMPRESSION: No active lung disease.

## 2010-11-23 LAB — CBC
HCT: 31.9 — ABNORMAL LOW
HCT: 44
Hemoglobin: 11 — ABNORMAL LOW
Hemoglobin: 14.9
MCHC: 34
MCHC: 34.5
MCV: 85.2
MCV: 85.6
Platelets: 174
Platelets: 227
RBC: 3.75 — ABNORMAL LOW
RBC: 5.14 — ABNORMAL HIGH
RDW: 13.3
RDW: 13.4
WBC: 7
WBC: 8.8

## 2010-11-23 LAB — TYPE AND SCREEN
ABO/RH(D): O NEG
Antibody Screen: NEGATIVE

## 2010-11-23 LAB — ABO/RH: ABO/RH(D): O NEG

## 2010-11-23 LAB — PREGNANCY, URINE: Preg Test, Ur: NEGATIVE

## 2011-04-12 ENCOUNTER — Other Ambulatory Visit: Payer: Self-pay | Admitting: Neurology

## 2011-04-12 DIAGNOSIS — G932 Benign intracranial hypertension: Secondary | ICD-10-CM

## 2011-04-12 DIAGNOSIS — R51 Headache: Secondary | ICD-10-CM

## 2011-04-17 ENCOUNTER — Ambulatory Visit
Admission: RE | Admit: 2011-04-17 | Discharge: 2011-04-17 | Disposition: A | Discharge status: Home / Self Care | Payer: 59 | Source: Ambulatory Visit | Attending: Neurology | Admitting: Neurology

## 2011-04-17 DIAGNOSIS — R839 Unspecified abnormal finding in cerebrospinal fluid: Secondary | ICD-10-CM

## 2011-04-17 DIAGNOSIS — R51 Headache: Secondary | ICD-10-CM

## 2011-04-17 DIAGNOSIS — G932 Benign intracranial hypertension: Secondary | ICD-10-CM

## 2011-04-17 NOTE — Discharge Instructions (Signed)

## 2011-04-27 ENCOUNTER — Emergency Department (HOSPITAL_COMMUNITY): Payer: 59

## 2011-04-27 ENCOUNTER — Other Ambulatory Visit: Payer: Self-pay

## 2011-04-27 ENCOUNTER — Inpatient Hospital Stay (HOSPITAL_COMMUNITY)
Admission: EM | Admit: 2011-04-27 | Discharge: 2011-04-28 | DRG: 392 | Disposition: A | Discharge status: Home / Self Care | Payer: 59 | Attending: Internal Medicine | Admitting: Internal Medicine

## 2011-04-27 ENCOUNTER — Encounter (HOSPITAL_COMMUNITY): Payer: Self-pay | Admitting: Emergency Medicine

## 2011-04-27 DIAGNOSIS — M797 Fibromyalgia: Secondary | ICD-10-CM

## 2011-04-27 DIAGNOSIS — G43909 Migraine, unspecified, not intractable, without status migrainosus: Secondary | ICD-10-CM | POA: Diagnosis present

## 2011-04-27 DIAGNOSIS — IMO0001 Reserved for inherently not codable concepts without codable children: Secondary | ICD-10-CM | POA: Diagnosis present

## 2011-04-27 DIAGNOSIS — F32A Depression, unspecified: Secondary | ICD-10-CM

## 2011-04-27 DIAGNOSIS — R7402 Elevation of levels of lactic acid dehydrogenase (LDH): Secondary | ICD-10-CM | POA: Diagnosis present

## 2011-04-27 DIAGNOSIS — R109 Unspecified abdominal pain: Secondary | ICD-10-CM | POA: Diagnosis present

## 2011-04-27 DIAGNOSIS — E039 Hypothyroidism, unspecified: Secondary | ICD-10-CM | POA: Diagnosis present

## 2011-04-27 DIAGNOSIS — D72829 Elevated white blood cell count, unspecified: Secondary | ICD-10-CM | POA: Diagnosis present

## 2011-04-27 DIAGNOSIS — A088 Other specified intestinal infections: Principal | ICD-10-CM | POA: Diagnosis present

## 2011-04-27 DIAGNOSIS — F329 Major depressive disorder, single episode, unspecified: Secondary | ICD-10-CM | POA: Diagnosis present

## 2011-04-27 DIAGNOSIS — R7401 Elevation of levels of liver transaminase levels: Secondary | ICD-10-CM | POA: Diagnosis present

## 2011-04-27 DIAGNOSIS — F3289 Other specified depressive episodes: Secondary | ICD-10-CM | POA: Diagnosis present

## 2011-04-27 DIAGNOSIS — R748 Abnormal levels of other serum enzymes: Secondary | ICD-10-CM

## 2011-04-27 DIAGNOSIS — E079 Disorder of thyroid, unspecified: Secondary | ICD-10-CM | POA: Diagnosis present

## 2011-04-27 DIAGNOSIS — R112 Nausea with vomiting, unspecified: Secondary | ICD-10-CM | POA: Diagnosis present

## 2011-04-27 HISTORY — DX: Major depressive disorder, single episode, unspecified: F32.9

## 2011-04-27 HISTORY — DX: Disorder of thyroid, unspecified: E07.9

## 2011-04-27 HISTORY — DX: Fibromyalgia: M79.7

## 2011-04-27 HISTORY — DX: Unspecified malignant neoplasm of skin, unspecified: C44.90

## 2011-04-27 HISTORY — DX: Depression, unspecified: F32.A

## 2011-04-27 LAB — DIFFERENTIAL
Basophils Absolute: 0 10*3/uL (ref 0.0–0.1)
Eosinophils Absolute: 0 10*3/uL (ref 0.0–0.7)
Eosinophils Relative: 0 % (ref 0–5)
Lymphs Abs: 0.9 10*3/uL (ref 0.7–4.0)

## 2011-04-27 LAB — CBC
MCH: 28.7 pg (ref 26.0–34.0)
MCHC: 35.4 g/dL (ref 30.0–36.0)
MCV: 80.9 fL (ref 78.0–100.0)
Platelets: 261 10*3/uL (ref 150–400)
RDW: 14 % (ref 11.5–15.5)

## 2011-04-27 LAB — COMPREHENSIVE METABOLIC PANEL
ALT: 67 U/L — ABNORMAL HIGH (ref 0–35)
Calcium: 9.8 mg/dL (ref 8.4–10.5)
GFR calc Af Amer: >90 mL/min (ref >90)
Glucose, Bld: 166 mg/dL — ABNORMAL HIGH (ref 70–99)
Sodium: 140 mEq/L (ref 135–145)
Total Protein: 8.4 g/dL — ABNORMAL HIGH (ref 6.0–8.3)

## 2011-04-27 IMAGING — CT CT ABD-PELV W/ CM
1 of 3 series · 14 of 32 positions shown, 19 images · IV contrast (APPLIED)
Comparison: [DATE]

CLINICAL DATA: Abdominal pain, vomiting.

CT ABDOMEN AND PELVIS WITH CONTRAST
TECHNIQUE: Multidetector CT imaging of the abdomen and pelvis was
performed following the standard protocol during bolus
administration of intravenous contrast.
Contrast: 100mL OMNIPAQUE IOHEXOL 300 MG/ML IJ SOLN

[Series 2: abd/pel with · axial · 0.74mm/px · z∈[-481,-76]mm · 14 of 91 slices shown, 19 images]
[im 5/91  soft-tissue]
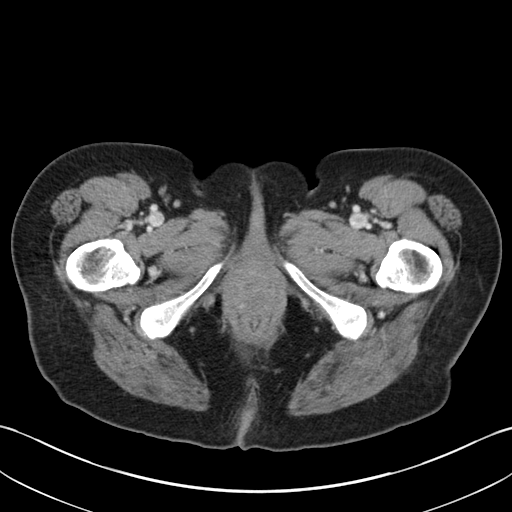
[im 5/91  bone]
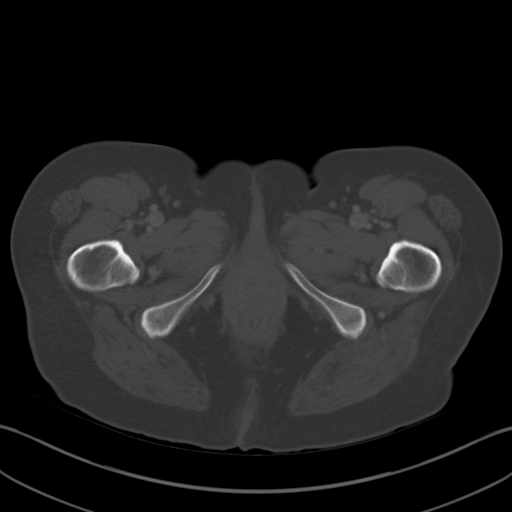
[im 14/91  soft-tissue]
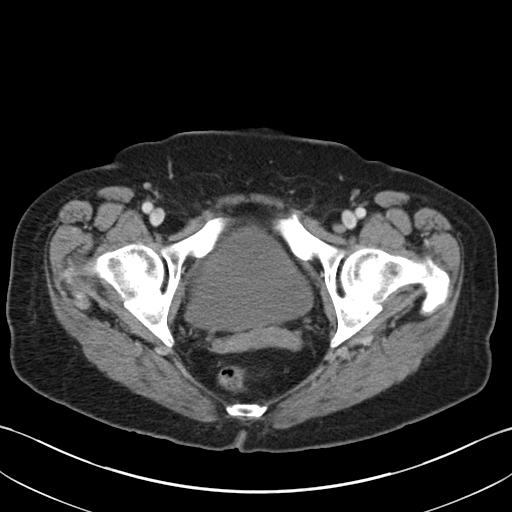
[im 19/91  soft-tissue]
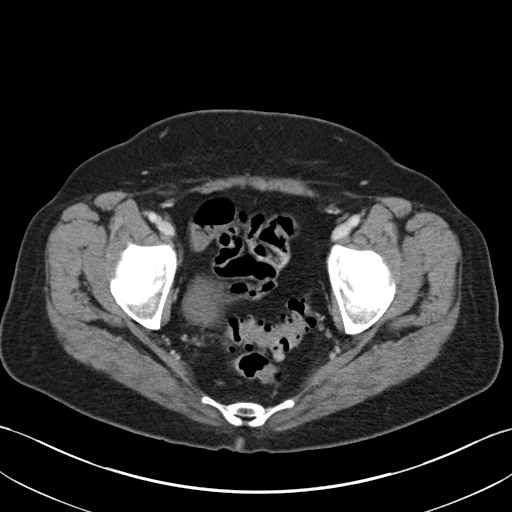
[im 28/91  soft-tissue]
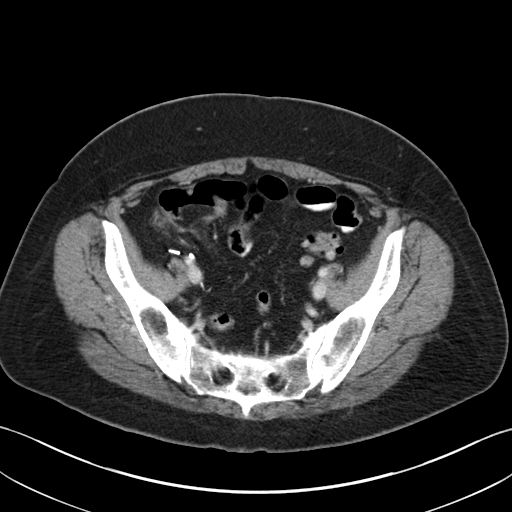
[im 32/91  soft-tissue]
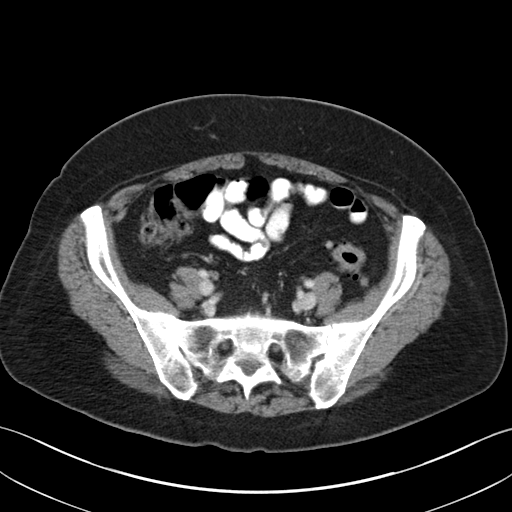
[im 41/91  soft-tissue]
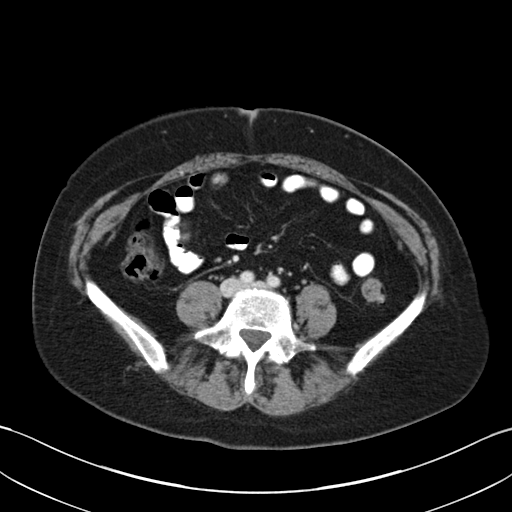
[im 46/91  soft-tissue]
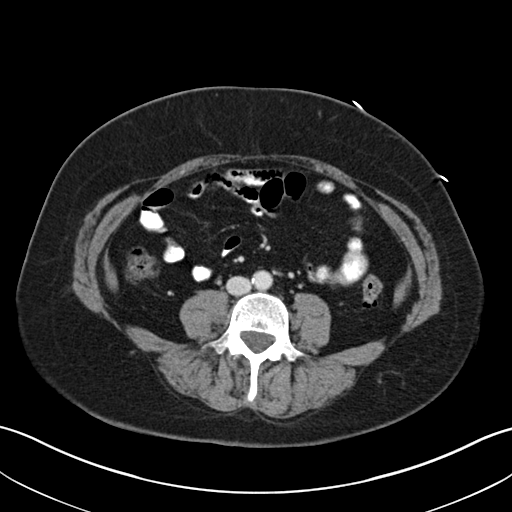
[im 50/91  soft-tissue]
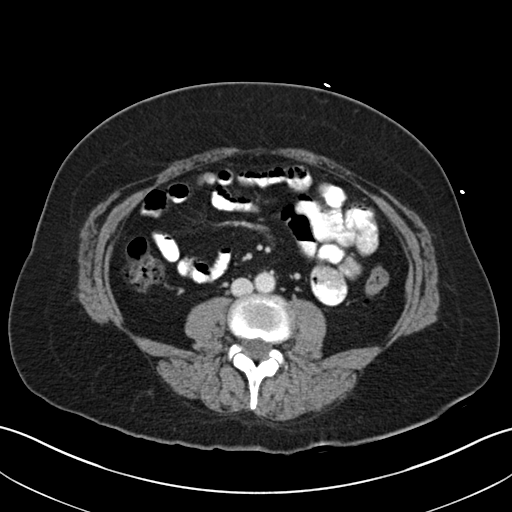
[im 59/91  soft-tissue]
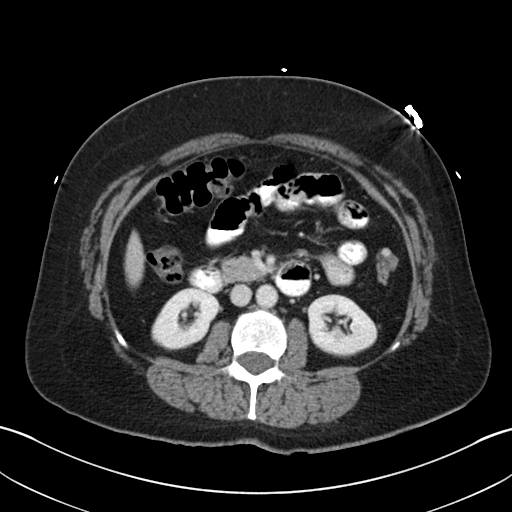
[im 59/91  bone]
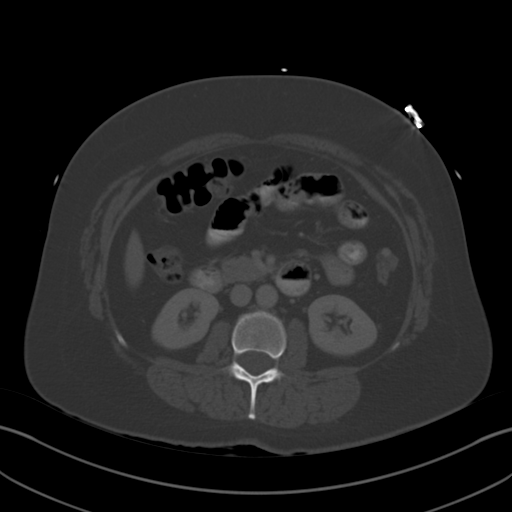
[im 64/91  soft-tissue]
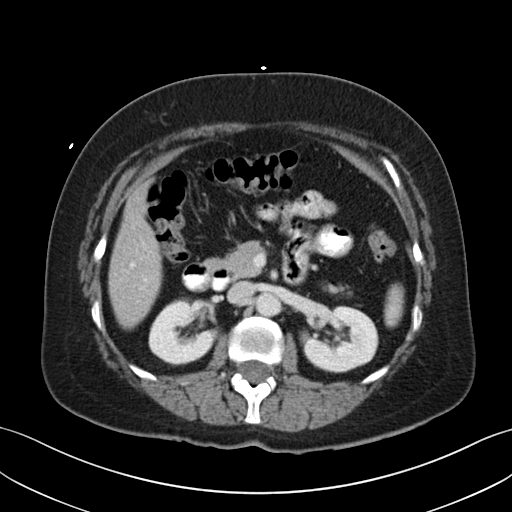
[im 73/91  soft-tissue]
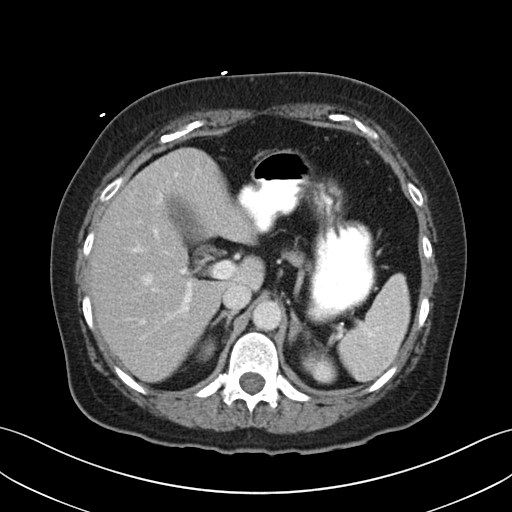
[im 73/91  lung]
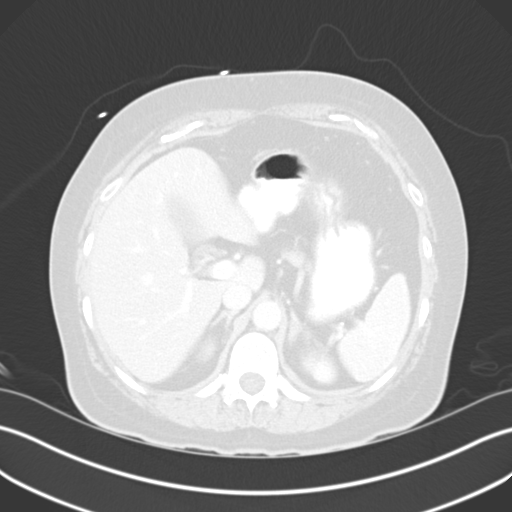
[im 77/91  soft-tissue]
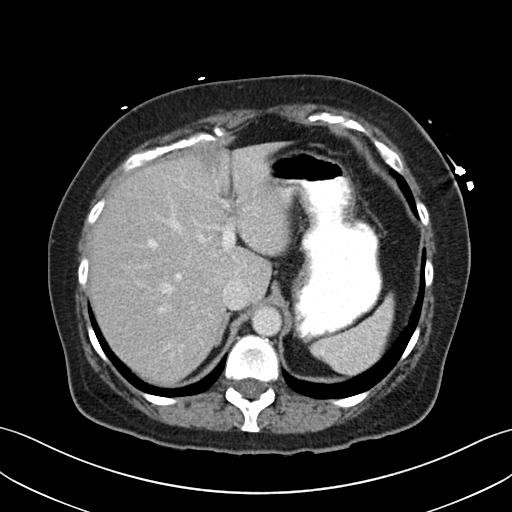
[im 77/91  lung]
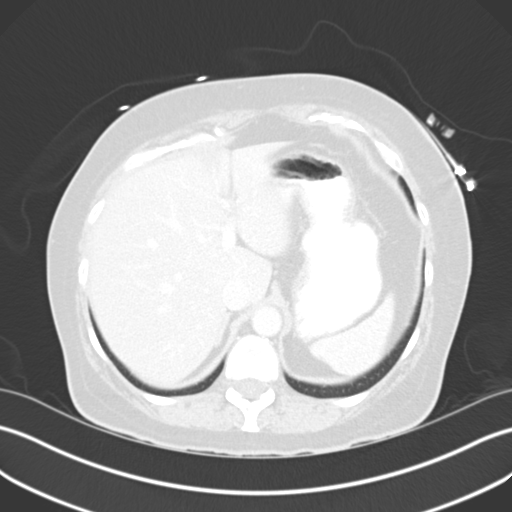
[im 82/91  lung]
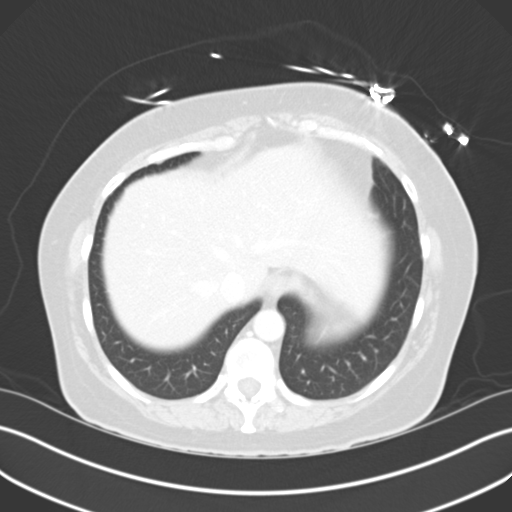
[im 86/91  soft-tissue]
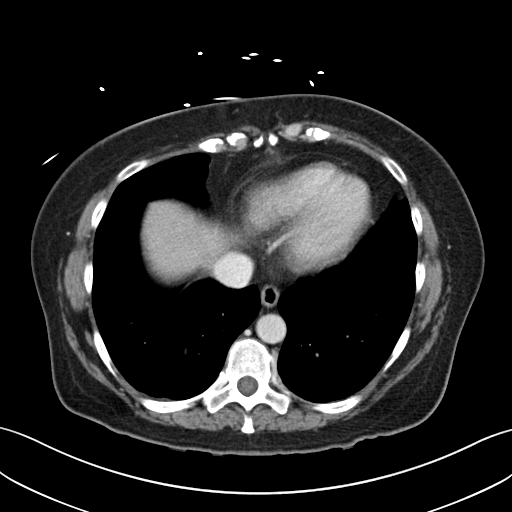
[im 86/91  lung]
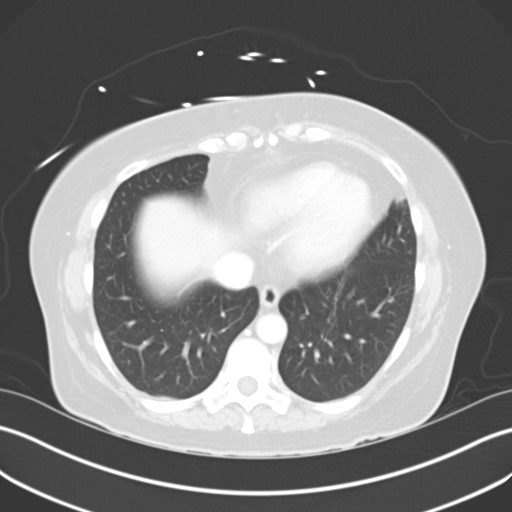

[14 of 32 positions shown; findings below may reference images not displayed]

FINDINGS: Calcified granuloma in the left lower lobe.  Lung bases
otherwise clear.  No effusions.  Heart is borderline in size.

Liver, gallbladder, spleen, pancreas, adrenals and kidneys are
unremarkable.  Scattered colonic diverticulosis, most pronounced in
the descending and sigmoid  colon.  No visible changes of active
diverticulitis.  Prior appendectomy.  Small bowel is decompressed.
Urinary bladder is unremarkable.  Prior hysterectomy.  No adnexal
masses.  Aorta is nonaneurysmal.

No acute bony abnormality.
IMPRESSION: Old granulomas in the left lower lobe.

No acute findings in the abdomen or pelvis.

Colonic diverticulosis.  No active diverticulitis.

## 2011-04-27 IMAGING — US US ABDOMEN COMPLETE
1 series · 13 of 25 positions shown · non-contrast
Comparison: CT of the abdomen and pelvis [DATE].

CLINICAL DATA: Right upper quadrant abdominal pain.

COMPLETE ABDOMINAL ULTRASOUND

[Series 1: us abdomen complete · 0.30mm/px · 13 of 64 slices shown]
[im 1/64]
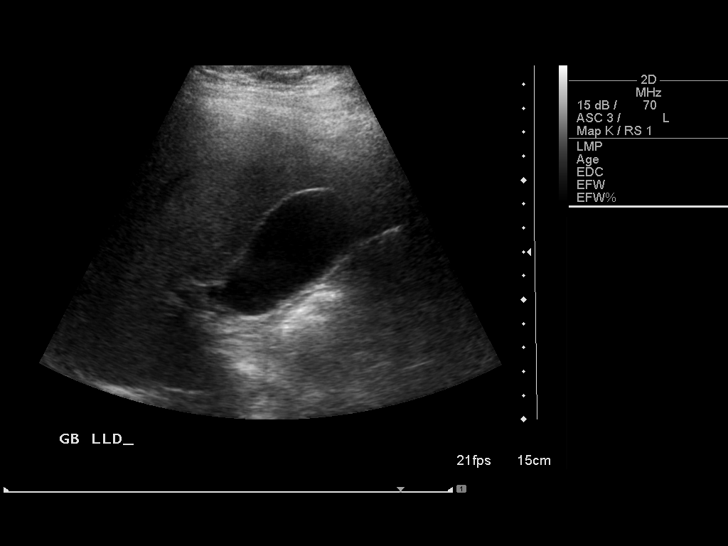
[im 6/64]
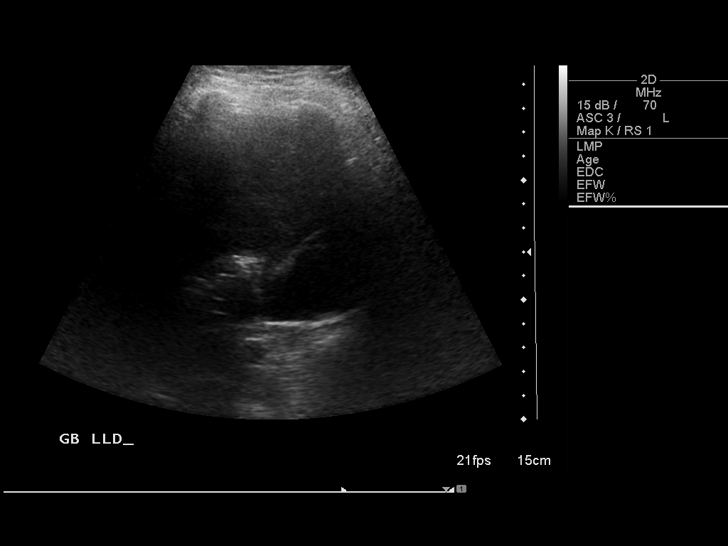
[im 11/64]
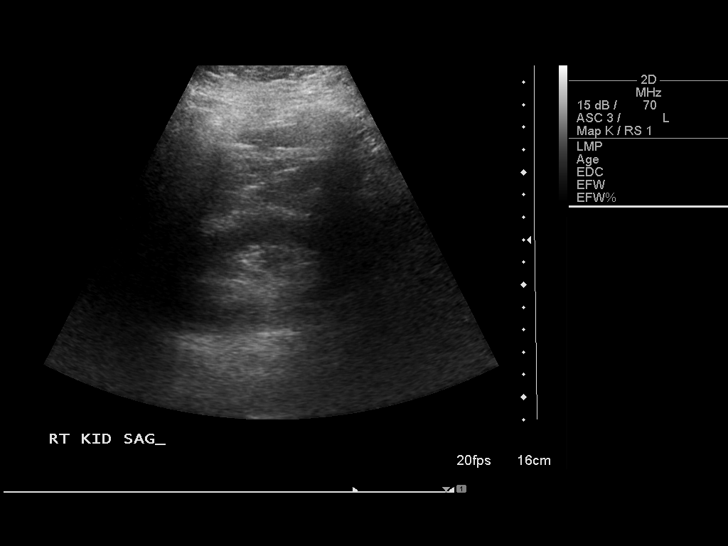
[im 16/64]
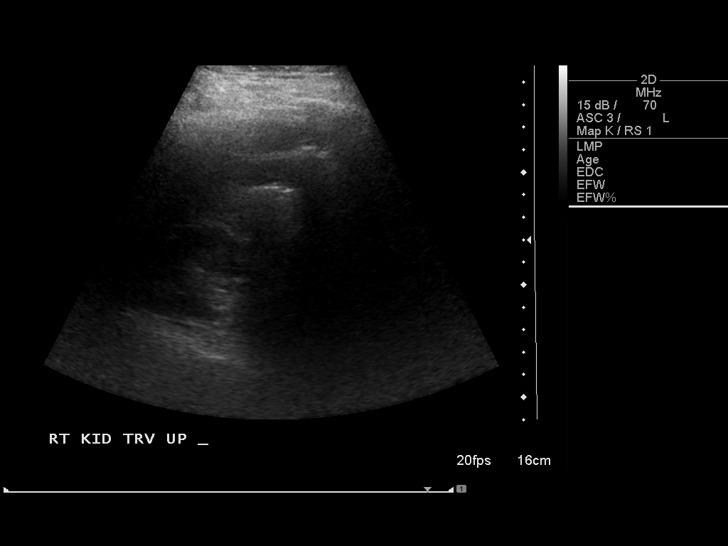
[im 22/64]
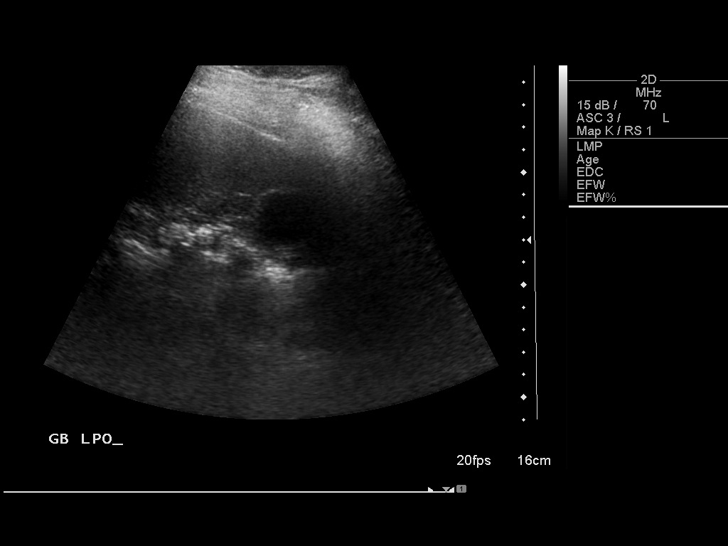
[im 27/64]
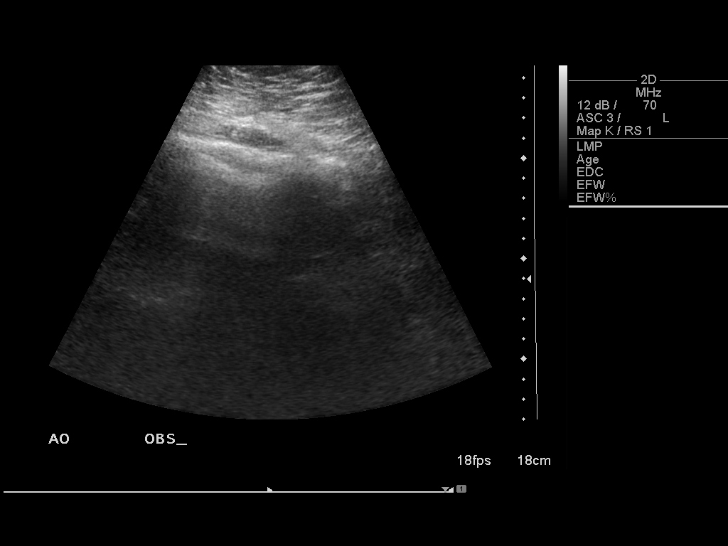
[im 32/64]
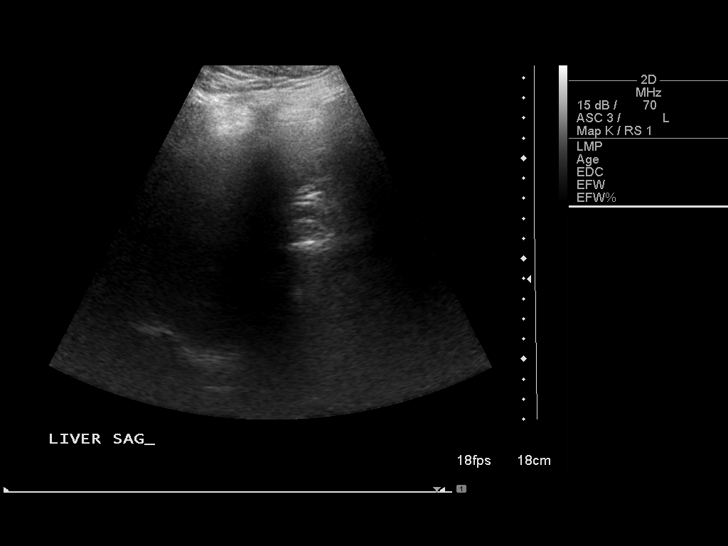
[im 37/64]
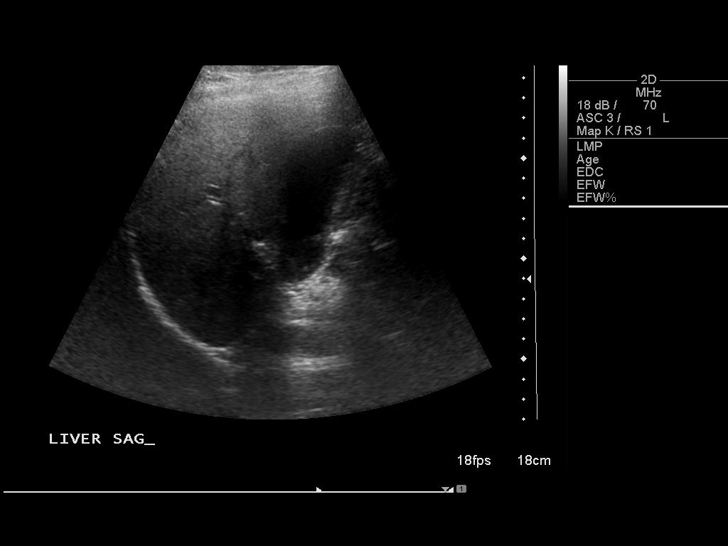
[im 43/64]
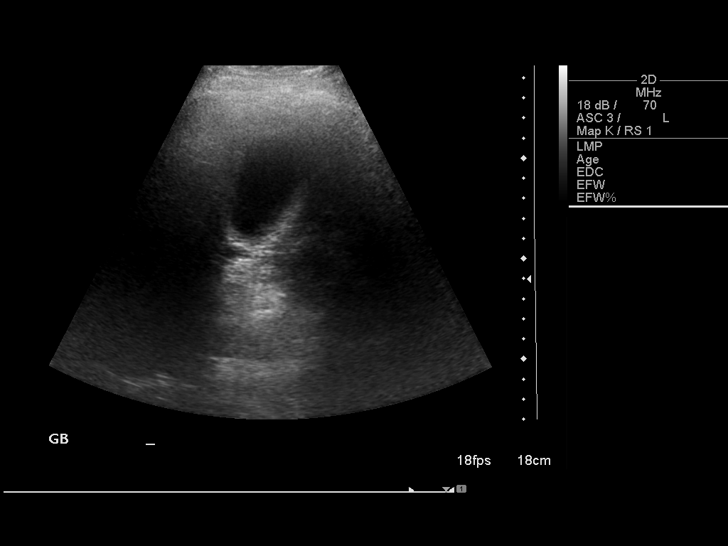
[im 48/64]
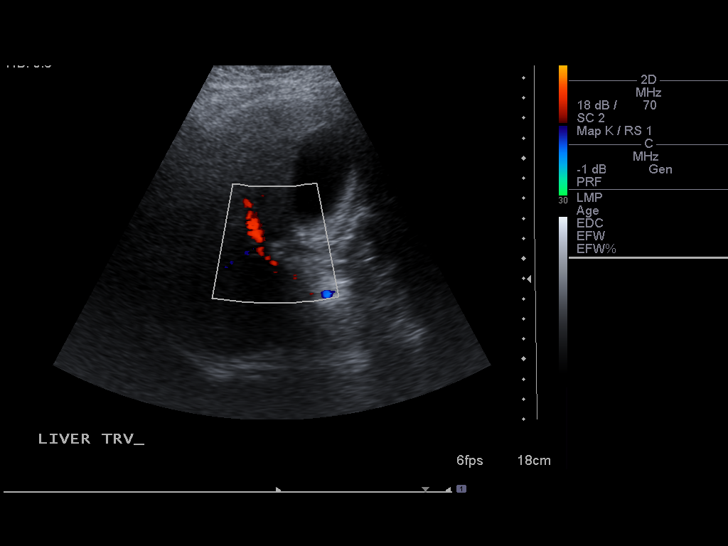
[im 53/64]
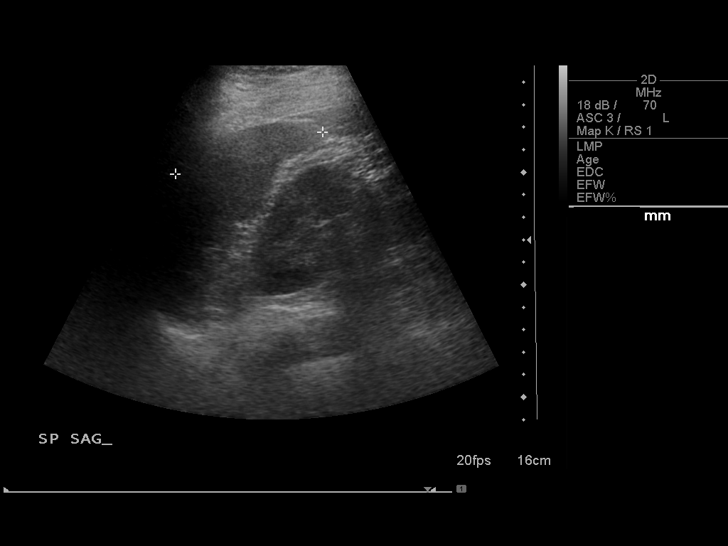
[im 58/64]
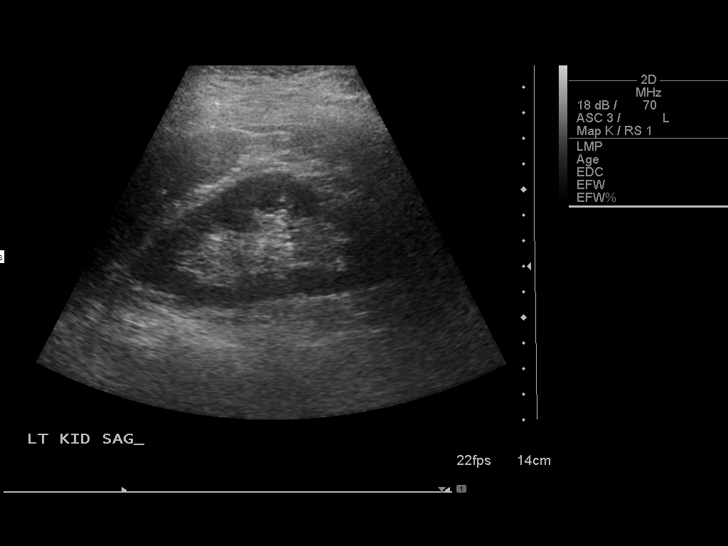
[im 64/64]
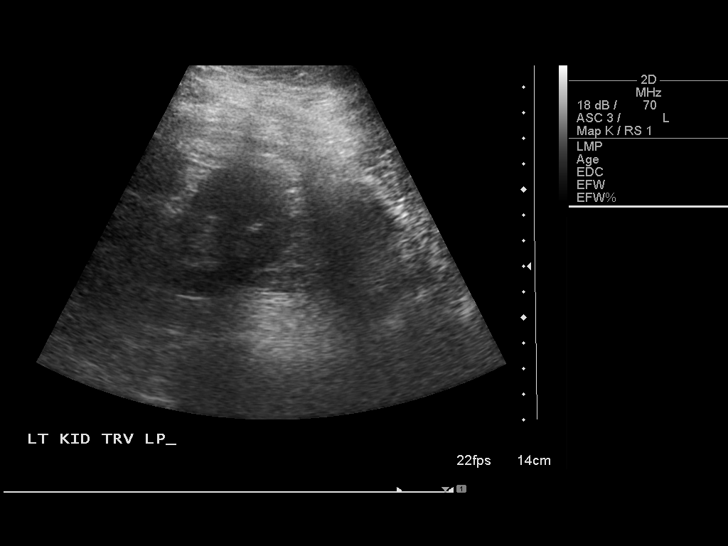

[13 of 25 positions shown; findings below may reference images not displayed]

FINDINGS: Gallbladder:  Gallbladder is normally distended with a normal wall
thickness of 2 mm.  No cholelithiasis.  No pericholecystic fluid.
Per report, the patient did not exhibit a sonographic Murphy's
sign.

Common bile duct:  5.4 mm in diameter

Liver:  The right lobe of the liver is normal in echotexture and
appearance without focal lesions.  The left lobe of the liver was
largely obscured by overlying bowel gas and was incompletely
visualized.

IVC:  Could not be visualized secondary to overlying bowel gas.

Pancreas:  Could not be visualized secondary to overlying bowel
gas.

Spleen:  Normal in echotexture appearance measuring 10.7 cm in
length.  No focal lesions.

Right Kidney:  Normal in echotexture appearance without focal
lesions or evidence of hydronephrosis.  10.4 cm in length.

Left Kidney:  Normal in echotexture appearance without focal
lesions or evidence of hydronephrosis.  10.9 cm in length.

Abdominal aorta:  The proximal and mid abdominal aorta could not be
visualized secondary to overlying bowel gas.  The distal aorta was
normal in caliber (1.5 cm in diameter).
IMPRESSION: 1.  Examination was limited by extensive bowel gas, as above.  No
definite acute findings noted.
2.  Specifically, despite the other limitations of this
examination, the gallbladder was well visualized, and there are no
signs of cholelithiasis or acute cholecystitis.

## 2011-04-27 MED ORDER — ACETAMINOPHEN 650 MG RE SUPP: 650.0000 mg | Freq: Four times a day (QID) | RECTAL | Status: DC | PRN

## 2011-04-27 MED ORDER — ONDANSETRON HCL 4 MG/2ML IJ SOLN
INTRAMUSCULAR | Status: AC
  Administered 2011-04-27: 4 mg via INTRAVENOUS
  Filled 2011-04-27: qty 2

## 2011-04-27 MED ORDER — HYDROMORPHONE HCL PF 1 MG/ML IJ SOLN
1.0000 mg | Freq: Once | INTRAMUSCULAR | Status: AC
  Administered 2011-04-27: 1 mg via INTRAVENOUS
  Filled 2011-04-27: qty 1

## 2011-04-27 MED ORDER — FENTANYL CITRATE 0.05 MG/ML IJ SOLN
50.0000 ug | Freq: Once | INTRAMUSCULAR | Status: AC
  Administered 2011-04-27: 50 ug via INTRAVENOUS
  Filled 2011-04-27: qty 2

## 2011-04-27 MED ORDER — ALBUTEROL SULFATE (5 MG/ML) 0.5% IN NEBU: 2.5000 mg | INHALATION_SOLUTION | RESPIRATORY_TRACT | Status: DC | PRN

## 2011-04-27 MED ORDER — FENTANYL CITRATE 0.05 MG/ML IJ SOLN
INTRAMUSCULAR | Status: AC
  Filled 2011-04-27: qty 2

## 2011-04-27 MED ORDER — ONDANSETRON HCL 4 MG/2ML IJ SOLN
4.0000 mg | Freq: Once | INTRAMUSCULAR | Status: AC
  Administered 2011-04-27: 4 mg via INTRAVENOUS
  Filled 2011-04-27: qty 2

## 2011-04-27 MED ORDER — KETOROLAC TROMETHAMINE 15 MG/ML IJ SOLN
15.0000 mg | Freq: Once | INTRAMUSCULAR | Status: AC
  Administered 2011-04-27: 15 mg via INTRAVENOUS
  Filled 2011-04-27: qty 1

## 2011-04-27 MED ORDER — IBUPROFEN 600 MG PO TABS
600.0000 mg | ORAL_TABLET | Freq: Four times a day (QID) | ORAL | Status: DC | PRN
  Administered 2011-04-28: 600 mg via ORAL
  Filled 2011-04-27: qty 1

## 2011-04-27 MED ORDER — FENTANYL CITRATE 0.05 MG/ML IJ SOLN
50.0000 ug | Freq: Once | INTRAMUSCULAR | Status: AC
  Administered 2011-04-27: 04:00:00 via INTRAVENOUS

## 2011-04-27 MED ORDER — METOCLOPRAMIDE HCL 5 MG/ML IJ SOLN
10.0000 mg | Freq: Once | INTRAMUSCULAR | Status: AC
  Administered 2011-04-27: 10 mg via INTRAVENOUS
  Filled 2011-04-27: qty 2

## 2011-04-27 MED ORDER — ONDANSETRON HCL 4 MG/2ML IJ SOLN
4.0000 mg | Freq: Three times a day (TID) | INTRAMUSCULAR | Status: AC | PRN
  Administered 2011-04-27: 4 mg via INTRAVENOUS
  Filled 2011-04-27 (×2): qty 2

## 2011-04-27 MED ORDER — ONDANSETRON HCL 4 MG/2ML IJ SOLN
4.0000 mg | Freq: Once | INTRAMUSCULAR | Status: AC
  Administered 2011-04-27: 4 mg via INTRAVENOUS

## 2011-04-27 MED ORDER — FLUOXETINE HCL 20 MG PO CAPS
40.0000 mg | ORAL_CAPSULE | Freq: Every day | ORAL | Status: DC
  Administered 2011-04-27 – 2011-04-28 (×2): 40 mg via ORAL
  Filled 2011-04-27 (×2): qty 2

## 2011-04-27 MED ORDER — GUAIFENESIN-DM 100-10 MG/5ML PO SYRP: 5.0000 mL | ORAL_SOLUTION | ORAL | Status: DC | PRN

## 2011-04-27 MED ORDER — ONDANSETRON HCL 4 MG/2ML IJ SOLN
INTRAMUSCULAR | Status: AC
  Filled 2011-04-27: qty 2

## 2011-04-27 MED ORDER — ONDANSETRON HCL 4 MG PO TABS: 4.0000 mg | ORAL_TABLET | Freq: Four times a day (QID) | ORAL | Status: DC | PRN

## 2011-04-27 MED ORDER — TIZANIDINE HCL 2 MG PO TABS
6.0000 mg | ORAL_TABLET | Freq: Two times a day (BID) | ORAL | Status: DC
  Administered 2011-04-27 – 2011-04-28 (×2): 6 mg via ORAL
  Filled 2011-04-27 (×3): qty 3

## 2011-04-27 MED ORDER — ZOLPIDEM TARTRATE 5 MG PO TABS
5.0000 mg | ORAL_TABLET | Freq: Every evening | ORAL | Status: DC | PRN
  Administered 2011-04-27: 5 mg via ORAL
  Filled 2011-04-27 (×2): qty 1

## 2011-04-27 MED ORDER — ACETAMINOPHEN 325 MG PO TABS: 650.0000 mg | ORAL_TABLET | Freq: Four times a day (QID) | ORAL | Status: DC | PRN

## 2011-04-27 MED ORDER — PROMETHAZINE HCL 25 MG/ML IJ SOLN: 25.0000 mg | INTRAMUSCULAR | Status: DC

## 2011-04-27 MED ORDER — SODIUM CHLORIDE 0.9 % IV SOLN
INTRAVENOUS | Status: AC
  Administered 2011-04-27: 100 mL via INTRAVENOUS

## 2011-04-27 MED ORDER — ONDANSETRON HCL 4 MG/2ML IJ SOLN: 4.0000 mg | Freq: Once | INTRAMUSCULAR | Status: AC

## 2011-04-27 MED ORDER — POTASSIUM CHLORIDE IN NACL 20-0.9 MEQ/L-% IV SOLN
INTRAVENOUS | Status: DC
  Administered 2011-04-27 – 2011-04-28 (×2): via INTRAVENOUS
  Filled 2011-04-27 (×2): qty 1000

## 2011-04-27 MED ORDER — ONDANSETRON HCL 4 MG/2ML IJ SOLN: 4.0000 mg | Freq: Four times a day (QID) | INTRAMUSCULAR | Status: DC | PRN

## 2011-04-27 MED ORDER — HYDROCODONE-ACETAMINOPHEN 5-325 MG PO TABS: 1.0000 | ORAL_TABLET | ORAL | Status: DC | PRN

## 2011-04-27 MED ORDER — IOHEXOL 300 MG/ML  SOLN
100.0000 mL | Freq: Once | INTRAMUSCULAR | Status: AC | PRN
  Administered 2011-04-27: 100 mL via INTRAVENOUS

## 2011-04-27 MED ORDER — LORAZEPAM 2 MG/ML IJ SOLN
1.0000 mg | Freq: Once | INTRAMUSCULAR | Status: AC
  Administered 2011-04-27: 1 mg via INTRAVENOUS
  Filled 2011-04-27: qty 1

## 2011-04-27 MED ORDER — LEVOTHYROXINE SODIUM 88 MCG PO TABS
88.0000 ug | ORAL_TABLET | Freq: Every day | ORAL | Status: DC
  Administered 2011-04-27 – 2011-04-28 (×2): 88 ug via ORAL
  Filled 2011-04-27 (×2): qty 1

## 2011-04-27 MED ORDER — DIPHENHYDRAMINE HCL 50 MG/ML IJ SOLN
25.0000 mg | Freq: Once | INTRAMUSCULAR | Status: AC
  Administered 2011-04-27: 25 mg via INTRAVENOUS
  Filled 2011-04-27: qty 1

## 2011-04-27 NOTE — ED Provider Notes (Addendum)
Medical screening examination/treatment/procedure(s) were conducted as a shared visit with non-physician practitioner(s) and myself.  I personally evaluated the patient during the encounter  Moderate to severe right upper abdominal tenderness. Abdomen soft. Ultrasound of abdomen pending.  EKG Interpretation:  Date & Time: 04/27/2011 3:52 AM  Rate: 73  Rhythm: normal sinus rhythm  QRS Axis: normal  Intervals: normal  ST/T Wave abnormalities: normal  Conduction Disutrbances:none  Narrative Interpretation: RSR' in V1 or V2  Old EKG Reviewed: unchanged      Hanley Seamen, MD 04/27/11 1610  Hanley Seamen, MD 04/27/11 [phone]

## 2011-04-27 NOTE — ED Notes (Signed)
Report called to [facility] on [address].Transferred to [id number]

## 2011-04-27 NOTE — ED Notes (Signed)
Pt. C/o abdominal pain, and nausea and vomiting that started 6 months ago.

## 2011-04-27 NOTE — ED Notes (Signed)
WUJ:[id number]<JY> Expected date:04/27/11<BR> Expected time: 1:08 AM<BR> Means of arrival:Ambulance<BR> Comments:<BR> EMS M251 GC -- Abdominal Pain

## 2011-04-27 NOTE — ED Provider Notes (Signed)
History     CSN: [id number]  Arrival date & time 04/27/11  0117   First MD Initiated Contact with Patient 04/27/11 [phone]      Chief Complaint  Patient presents with  . N/V/D/abdominal pain     (Consider location/radiation/quality/duration/timing/severity/associated sxs/prior treatment) HPI  Patient presents to ER comlaining of a multiple month hx of intermittent RUQ pain and nausea as well as a multiple year hx of intermittent migraine HA with acute onset vomiting that began at 2:30 yesterday afternoon with multiple episodes vomiting since onset. Patient denies aggravating or alleviating factors for abdominal pain. She has been seeing migraine specialist for migraine HA without relief of HA's but recurrent migraine HA's x 2 years. Patient states RUQ pain is similar to recurrent pain of the past but is more severe today with associated nausea and vomiting as well a associated loose stool. She denies fevers, chills, dizziness, visual changes, neck stiffness, CP, SOB, dysuria, hematuria or blood in stool. Patient has hx of diverticulitis, appendectomy and hysterectomy. She sees Dr. Gerri Spore but has not seen her in a couple of months. Patient recently [facility] neurologist and [facility] for her migraine HA's, last week.   Past Medical History  Diagnosis Date  . Fibromyalgia   . Depression   . Thyroid disease     Past Surgical History  Procedure Date  . Abdominal hysterectomy   . Appendectomy     No family history on file.  History  Substance Use Topics  . Smoking status: Not on file  . Smokeless tobacco: Not on file  . Alcohol Use: No    OB History    Grav Para Term Preterm Abortions TAB SAB Ect Mult Living                  Review of Systems  All other systems reviewed and are negative.    Allergies  Erythromycin; Morphine and related; and Promethazine  Home Medications   Current Outpatient Rx  Name Route Sig Dispense Refill  . FLUOXETINE HCL 40 MG  PO CAPS Oral Take 40 mg by mouth daily.    Marland Kitchen LEVOTHYROXINE SODIUM 88 MCG PO TABS Oral Take 88 mcg by mouth daily.    Marland Kitchen PROMETHAZINE HCL 25 MG/ML IJ SOLN Intravenous Inject 25 mg into the vein See admin instructions. Apply 1-72mls to wrist and rub every 6 hours as needed    . TIZANIDINE HCL 6 MG PO CAPS Oral Take 6 mg by mouth 2 (two) times daily.    Marland Kitchen ZOLPIDEM TARTRATE ER 12.5 MG PO TBCR Oral Take 12.5 mg by mouth at bedtime as needed.      BP 120/69  Pulse 78  Temp(Src) 97.8 F (36.6 C) (Oral)  Resp 20  SpO2 100%  Physical Exam  Nursing note and vitals reviewed. Constitutional: She is oriented to person, place, and time. She appears well-developed and well-nourished. No distress.       Uncomfortable appearing.   HENT:  Head: Normocephalic and atraumatic.  Eyes: Conjunctivae are normal.  Neck: Normal range of motion. Neck supple.  Cardiovascular: Normal rate, regular rhythm, normal heart sounds and intact distal pulses.  Exam reveals no gallop and no friction rub.   No murmur heard. Pulmonary/Chest: Effort normal and breath sounds normal. No respiratory distress. She has no wheezes. She has no rales. She exhibits no tenderness.  Abdominal: Soft. Bowel sounds are normal. She exhibits no distension and no mass. There is tenderness. There is no rebound and  no guarding.       RUQ TTP with diffuse mild TTP but no rigidity or peritoneal signs  Musculoskeletal: Normal range of motion. She exhibits no edema and no tenderness.  Neurological: She is alert and oriented to person, place, and time.  Skin: Skin is warm and dry. No rash noted. She is not diaphoretic. No erythema.  Psychiatric: She has a normal mood and affect.    ED Course  Procedures (including critical care time)  IV zofran  IV reglan  IV dilaudid  IV fluids  Labs Reviewed  CBC - Abnormal; Notable for the following:    WBC 11.2 (*)    All other components within normal limits  DIFFERENTIAL - Abnormal; Notable for  the following:    Neutrophils Relative 89 (*)    Neutro Abs 10.0 (*)    Lymphocytes Relative 8 (*)    All other components within normal limits  COMPREHENSIVE METABOLIC PANEL - Abnormal; Notable for the following:    Glucose, Bld 166 (*)    Total Protein 8.4 (*)    AST 73 (*)    ALT 67 (*)    All other components within normal limits  LIPASE, BLOOD   US Abdomen Complete  04/27/2011  *RADIOLOGY REPORT*  Clinical Data:  Right upper quadrant abdominal pain.  COMPLETE ABDOMINAL ULTRASOUND  Comparison:  CT of the abdomen and pelvis 08/23/2010.  Findings:  Gallbladder:  Gallbladder is normally distended with a normal wall thickness of 2 mm.  No cholelithiasis.  No pericholecystic fluid. Per report, the patient did not exhibit a sonographic Murphy's sign.  Common bile duct:  5.4 mm in diameter  Liver:  The right lobe of the liver is normal in echotexture and appearance without focal lesions.  The left lobe of the liver was largely obscured by overlying bowel gas and was incompletely visualized.  IVC:  Could not be visualized secondary to overlying bowel gas.  Pancreas:  Could not be visualized secondary to overlying bowel gas.  Spleen:  Normal in echotexture appearance measuring 10.7 cm in length.  No focal lesions.  Right Kidney:  Normal in echotexture appearance without focal lesions or evidence of hydronephrosis.  10.4 cm in length.  Left Kidney:  Normal in echotexture appearance without focal lesions or evidence of hydronephrosis.  10.9 cm in length.  Abdominal aorta:  The proximal and mid abdominal aorta could not be visualized secondary to overlying bowel gas.  The distal aorta was normal in caliber (1.5 cm in diameter).  IMPRESSION: 1.  Examination was limited by extensive bowel gas, as above.  No definite acute findings noted. 2.  Specifically, despite the other limitations of this examination, the gallbladder was well visualized, and there are no signs of cholelithiasis or acute cholecystitis.   Original Report Authenticated By: Florencia Reasons, M.D.   Ct Abdomen Pelvis W Contrast  04/27/2011  *RADIOLOGY REPORT*  Clinical Data: Abdominal pain, vomiting.  CT ABDOMEN AND PELVIS WITH CONTRAST  Technique:  Multidetector CT imaging of the abdomen and pelvis was performed following the standard protocol during bolus administration of intravenous contrast.  Contrast: OMNIPAQUE IOHEXOL 300 MG/ML IJ SOLN  Comparison: 08/23/2010  Findings: Calcified granuloma in the left lower lobe.  Lung bases otherwise clear.  No effusions.  Heart is borderline in size.  Liver, gallbladder, spleen, pancreas, adrenals and kidneys are unremarkable.  Scattered colonic diverticulosis, most pronounced in the descending and sigmoid  colon.  No visible changes of active diverticulitis.  Prior appendectomy.  Small bowel is decompressed. Urinary bladder is unremarkable.  Prior hysterectomy.  No adnexal masses.  Aorta is nonaneurysmal.  No acute bony abnormality.  IMPRESSION: Old granulomas in the left lower lobe.  No acute findings in the abdomen or pelvis.  Colonic diverticulosis.  No active diverticulitis.  Original Report Authenticated By: Cyndie Chime, M.D.     1. Intractable nausea and vomiting   2. Migraine headache   3. Abdominal pain   4. Elevated liver enzymes     Dr. Dulce Sellar to consult for [facility] GI per Dr. Doristine Church request for GI consult.   MDM  Intractable nausea vomiting abdominal pain despite numerous doses of anti-emetics. Vital signs are stable she's afebrile. [facility], Dr. Thedore Mins, due to intractable nausea vomiting and pain.        [place]ness [place], [place] 04/27/11 1258

## 2011-04-27 NOTE — H&P (Signed)
Kathleen Sweeney [phone]  Outpatient Primary MD for the patient is Elie Confer, MD, MD  With History of -  Past Medical History  Diagnosis Date  . Fibromyalgia   . Depression   . Thyroid disease   . Migraine       Past Surgical History  Procedure Date  . Abdominal hysterectomy   . Appendectomy     in for   Chief Complaint  Patient presents with  . N/V/D/abdominal pain      HPI  Kathleen Sweeney  is a 51 y.o. female,  history of migraine headaches,depression, some chronic nausea for last several months, having diarrhea along with nausea vomiting since yesterday afternoon , thereafter as he started having generalized headache along with mild photophobia typical for her migraine headache attacks, denies any fevers, no neck stiffness, No sick contacts, no bad food materials, in ER Labs stable except mildly elevated AST-ALT, stable RUQ [place] and Abd CT. I was called to admit for N&V + Migrane headaches.    Review of Systems    In addition to the HPI above,   No Fever-chills, +ve genralised Headache typical of her migraine , No changes with Vision or hearing, No problems swallowing food or Liquids, No Chest pain, Cough or Shortness of Breath, No Abdominal pain, + Nausea & Vommitting, + diarrhea  No Blood in stool or Urine, No dysuria, No new skin rashes or bruises, No new joints pains-aches,  No new weakness, tingling, numbness in any extremity, No recent weight gain or loss, No polyuria, polydypsia or polyphagia, No significant Mental Stressors.  A full 10 point Review of Systems was done, except as stated above, all other Review of Systems were negative.   Social History History  Substance Use Topics  . Smoking status: Never Smoker   . Smokeless tobacco: Never Used  . Alcohol Use: No      Family History Migraines - mother  Prior to Admission medications   Medication Sig Start Date End Date Taking? Authorizing Provider  FLUoxetine  (PROZAC) 40 MG capsule Take 40 mg by mouth daily.   Yes Historical Provider, MD  levothyroxine (SYNTHROID, LEVOTHROID) 88 MCG tablet Take 88 mcg by mouth daily.   Yes Historical Provider, MD  promethazine (PHENERGAN) 25 MG/ML injection Inject 25 mg into the vein See admin instructions. Apply 1-71mls to wrist and rub every 6 hours as needed   Yes Historical Provider, MD  tizanidine (ZANAFLEX) 6 MG capsule Take 6 mg by mouth 2 (two) times daily.   Yes Historical Provider, MD  zolpidem (AMBIEN CR) 12.5 MG CR tablet Take 12.5 mg by mouth at bedtime as needed.   Yes Historical Provider, MD    Allergies  Allergen Reactions  . Erythromycin Anaphylaxis  . Morphine And Related     Not given  . Promethazine     "made her sick"    Physical Exam  Vitals  Blood pressure 144/86, pulse 65, temperature 97.9 F (36.6 C), temperature source Oral, resp. rate 21, SpO2 97.00%.   1. General middle aged caucasian female lying in bed in NAD,  Looks tiered  2. Normal affect and insight, Not Suicidal or Homicidal, Awake Alert, Oriented *3.  3. No F.N deficits, ALL C.Nerves Intact, Strength 5/5 all 4 extremities, Sensation intact all 4 extremities, Plantars down going.  4. Ears and Eyes appear Normal, Conjunctivae clear, PERRLA. Moist Oral Mucosa.  5. Supple Neck, No JVD, No cervical lymphadenopathy appriciated, No Carotid Bruits.  6. Symmetrical Chest wall movement,  Good air movement bilaterally, CTAB.  7. RRR, No Gallops, Rubs or Murmurs, No Parasternal Heave.  8. Positive Bowel Sounds, Abdomen Soft, Non tender, No organomegaly appriciated,No rebound -guarding or rigidity.  9.  No Cyanosis, Normal Skin Turgor, No Skin Rash or Bruise.  10. Good muscle tone,  joints appear normal , no effusions, Normal ROM.  11. No Palpable Lymph Nodes in Neck or Axillae   Data Review  CBC  Lab 04/27/11 0154  WBC 11.2*  HGB 14.6  HCT 41.2  PLT 261  MCV 80.9  MCH 28.7  MCHC 35.4  RDW 14.0  LYMPHSABS  0.9  MONOABS 0.3  EOSABS 0.0  BASOSABS 0.0  BANDABS --   ------------------------------------------------------------------------------------------------------------------ Chemistries   Lab 04/27/11 0154  NA 140  K 3.6  CL 100  CO2 22  GLUCOSE 166*  BUN 10  CREATININE 0.74  CALCIUM 9.8  MG --  AST 73*  ALT 67*  ALKPHOS 102  BILITOT 0.8   ------------------------------------------------------------------------------------------------------------------ CrCl is unknown because both a height and weight (above a minimum accepted value) are required for this calculation. ------------------------------------------------------------------------------------------------------------------ No results found for this basename: TSH,T4TOTAL,FREET3,T3FREE,THYROIDAB in the last 72 hours  Coagulation profile No results found for this basename: INR:5,PROTIME:5 in the last 168 hours ------------------------------------------------------------------------------------------------------------------- No results found for this basename: DDIMER:2 in the last 72 hours ------------------------------------------------------------------------------------------------------------------- Cardiac Enzymes No results found for this basename: CK:3,CKMB:3,TROPONINI:3,MYOGLOBIN:3 in the last 168 hours ------------------------------------------------------------------------------------------------------------------ No components found with this basename: POCBNP:3 ------------------------------------------------------------------------------------------------------------------  Imaging results:   US Abdomen Complete  04/27/2011  *RADIOLOGY REPORT*  Clinical Data:  Right upper quadrant abdominal pain.  COMPLETE ABDOMINAL ULTRASOUND  Comparison:  CT of the abdomen and pelvis 08/23/2010.  Findings:  Gallbladder:  Gallbladder is normally distended with a normal wall thickness of 2 mm.  No cholelithiasis.  No  pericholecystic fluid. Per report, the patient did not exhibit a sonographic Murphy's sign.  Common bile duct:  5.4 mm in diameter  Liver:  The right lobe of the liver is normal in echotexture and appearance without focal lesions.  The left lobe of the liver was largely obscured by overlying bowel gas and was incompletely visualized.  IVC:  Could not be visualized secondary to overlying bowel gas.  Pancreas:  Could not be visualized secondary to overlying bowel gas.  Spleen:  Normal in echotexture appearance measuring 10.7 cm in length.  No focal lesions.  Right Kidney:  Normal in echotexture appearance without focal lesions or evidence of hydronephrosis.  10.4 cm in length.  Left Kidney:  Normal in echotexture appearance without focal lesions or evidence of hydronephrosis.  10.9 cm in length.  Abdominal aorta:  The proximal and mid abdominal aorta could not be visualized secondary to overlying bowel gas.  The distal aorta was normal in caliber (1.5 cm in diameter).  IMPRESSION: 1.  Examination was limited by extensive bowel gas, as above.  No definite acute findings noted. 2.  Specifically, despite the other limitations of this examination, the gallbladder was well visualized, and there are no signs of cholelithiasis or acute cholecystitis.  Original Report Authenticated By: Florencia Reasons, M.D.   Ct Abdomen Pelvis W Contrast  04/27/2011  *RADIOLOGY REPORT*  Clinical Data: Abdominal pain, vomiting.  CT ABDOMEN AND PELVIS WITH CONTRAST  Technique:  Multidetector CT imaging of the abdomen and pelvis was performed following the standard protocol during bolus administration of intravenous contrast.  Contrast: OMNIPAQUE IOHEXOL 300 MG/ML IJ SOLN  Comparison: 08/23/2010  Findings: Calcified granuloma in  the left lower lobe.  Lung bases otherwise clear.  No effusions.  Heart is borderline in size.  Liver, gallbladder, spleen, pancreas, adrenals and kidneys are unremarkable.  Scattered colonic diverticulosis,  most pronounced in the descending and sigmoid  colon.  No visible changes of active diverticulitis.  Prior appendectomy.  Small bowel is decompressed. Urinary bladder is unremarkable.  Prior hysterectomy.  No adnexal masses.  Aorta is nonaneurysmal.  No acute bony abnormality.  IMPRESSION: Old granulomas in the left lower lobe.  No acute findings in the abdomen or pelvis.  Colonic diverticulosis.  No active diverticulitis.  Original Report Authenticated By: Cyndie Chime, M.D.    Assessment & Plan  1.Migraine headaches along with nausea and vomiting -  I think that patient got a viral gastroenteritis which initially started her nausea and vomiting episode along with diarrhea, is in turn precipitated  her migraine headaches. -at this Point her labs and radiological studies are pretty unremarkable - give her IV Toradol and Benadryl combination x1 for her migraine, IV fluids, clear liquid diet, when necessary Zofran, monitor her symptoms, repeat CMP in am, GI to be called if not better.   2.Fibromyalgia - outpt follow   3. Mild Leukocytosis - probably reactionary due to #1 above we'll monitor .   4. H/O of Thyroid disorder - will check a baseline TSH and then outpatient monitor.   DVT Prophylaxis SCDs  AM Labs Ordered, also please review Full Orders  Admission, patients condition and plan of care including tests being ordered have been discussed with the patient and wife who indicate understanding and agree with the plan and Code Status.  Code Status SCDs  Condition GUARDED  Leroy Sea M.D on 04/27/2011 at 1:17 PM  [facility] Office  [phone]

## 2011-04-27 NOTE — ED Notes (Signed)
Patient c/o abdominal pain and n.v that started 6 months ago

## 2011-04-27 NOTE — ED Notes (Signed)
Patient also c/o nausea, vomiting and diarrhea

## 2011-04-28 LAB — URINALYSIS, ROUTINE W REFLEX MICROSCOPIC
Bilirubin Urine: NEGATIVE
Hgb urine dipstick: NEGATIVE
Ketones, ur: 40 mg/dL — AB
Nitrite: NEGATIVE
Specific Gravity, Urine: 1.017 (ref 1.005–1.030)
pH: 6 (ref 5.0–8.0)

## 2011-04-28 LAB — CBC
HCT: 35 % — ABNORMAL LOW (ref 36.0–46.0)
Hemoglobin: 11.5 g/dL — ABNORMAL LOW (ref 12.0–15.0)
WBC: 7 10*3/uL (ref 4.0–10.5)

## 2011-04-28 LAB — COMPREHENSIVE METABOLIC PANEL
ALT: 33 U/L (ref 0–35)
AST: 34 U/L (ref 0–37)
Alkaline Phosphatase: 67 U/L (ref 39–117)
CO2: 25 mEq/L (ref 19–32)
Calcium: 8.2 mg/dL — ABNORMAL LOW (ref 8.4–10.5)
GFR calc Af Amer: >90 mL/min (ref >90)
GFR calc non Af Amer: >90 mL/min (ref >90)
Glucose, Bld: 80 mg/dL (ref 70–99)
Potassium: 3.4 mEq/L — ABNORMAL LOW (ref 3.5–5.1)
Sodium: 140 mEq/L (ref 135–145)
Total Protein: 6.1 g/dL (ref 6.0–8.3)

## 2011-04-28 LAB — TSH: TSH: 3.68 u[IU]/mL (ref 0.350–4.500)

## 2011-04-28 MED ORDER — ONDANSETRON HCL 4 MG PO TABS: 4.0000 mg | ORAL_TABLET | Freq: Three times a day (TID) | ORAL | Status: AC | PRN

## 2011-04-28 MED ORDER — POTASSIUM CHLORIDE CRYS ER 20 MEQ PO TBCR
40.0000 meq | EXTENDED_RELEASE_TABLET | Freq: Once | ORAL | Status: DC
  Filled 2011-04-28: qty 2

## 2011-04-28 NOTE — Progress Notes (Signed)
Patient discharged via wheelchair.  Discharge instructions given and prescriptions given.  No change from morning assessment.

## 2011-04-28 NOTE — Discharge Instructions (Signed)
Follow with Primary MD Elie Confer, MD, MD in 4 days   Get CBC, CMP, TSH  checked 4 days by Primary MD and again as instructed by your Primary MD.    Get Medicines reviewed and adjusted.  Please request your Prim.MD to go over all Hospital Tests and Procedure/Radiological results at the follow up, please get all Hospital records sent to your Prim MD by signing hospital release before you go home.  Follow-up Information    Follow up with Elie Confer, MD. Schedule an appointment as soon as possible for a visit in 4 days.      Follow up with Your Neurologist at [facility]. Schedule an appointment as soon as possible for a visit in 1 week.         Activity: Fall precautions use walker/cane & assistance as needed  Diet: Heart Healthy, Aspiration precautions.  Disposition Home  If you experience worsening of your admission symptoms, develop shortness of breath, life threatening emergency, suicidal or homicidal thoughts you must seek medical attention immediately by calling 911 or calling your MD immediately  if symptoms less severe.  You Must read complete instructions/literature along with all the possible adverse reactions/side effects for all the Medicines you take and that have been prescribed to you. Take any new Medicines after you have completely understood and accpet all the possible adverse reactions/side effects.   Do not drive if your were admitted for syncope or siezures until you have seen by Primary MD or a Neurologist and advised to drive.  Do not drive when taking Pain medications.    Do not take more than prescribed Pain, Sleep and Anxiety Medications  Special Instructions: If you have smoked or chewed Tobacco  in the last 2 yrs please stop smoking, stop any regular Alcohol  and or any Recreational drug use.  Wear Seat belts while driving.

## 2011-04-28 NOTE — Discharge Summary (Addendum)
Kathleen Sweeney, 51 y.o., DOB 1960-08-30, MRN [id number]. Admission date: 04/27/2011 Discharge Date 04/28/2011 Primary MD Elie Confer, MD, MD Admitting Physician Leroy Sea, MD  Admission Diagnosis  Abdominal pain [789.0] Migraine headache [346.90] Elevated liver enzymes [790.4] Intractable nausea and vomiting [536.2] abdominal pain  Discharge Diagnosis   Principal Problem:  *Nausea & vomiting Active Problems:  Migraine  Thyroid disease  Fibromyalgia  Depression    Past Medical History  Diagnosis Date  . Fibromyalgia   . Depression   . Thyroid disease   . Migraine   . Skin cancer Had cancer removed in 2000    Past Surgical History  Procedure Date  . Abdominal hysterectomy   . Appendectomy      Hospital Course See H&P, Labs, Consult and Test reports for all details in brief, patient was admitted for    1.Migraine headaches along with nausea and vomiting - I think that patient got a viral gastroenteritis which initially started her nausea and vomiting episode along with diarrhea, is in turn precipitated her migraine headaches. - at this Point her symptoms have all resolved, she feels much better and wants to go home. Her labs and radiological studies are pretty unremarkable, have requested her to follow with Primary MD and her Neurologist soon.    2.Fibromyalgia - outpt follow    3. Mild Leukocytosis - probably reactionary due to #1 has resolved. UA stable.   4. H/O of Thyroid disorder - will request Primary MD to please check TSH level in 1 week       Significant Tests:  See full reports for all details     US Abdomen Complete  04/27/2011  *RADIOLOGY REPORT*  Clinical Data:  Right upper quadrant abdominal pain.  COMPLETE ABDOMINAL ULTRASOUND  Comparison:  CT of the abdomen and pelvis 08/23/2010.  Findings:  Gallbladder:  Gallbladder is normally distended with a normal wall thickness of 2 mm.  No cholelithiasis.  No pericholecystic fluid. Per report,  the patient did not exhibit a sonographic Murphy's sign.  Common bile duct:  5.4 mm in diameter  Liver:  The right lobe of the liver is normal in echotexture and appearance without focal lesions.  The left lobe of the liver was largely obscured by overlying bowel gas and was incompletely visualized.  IVC:  Could not be visualized secondary to overlying bowel gas.  Pancreas:  Could not be visualized secondary to overlying bowel gas.  Spleen:  Normal in echotexture appearance measuring 10.7 cm in length.  No focal lesions.  Right Kidney:  Normal in echotexture appearance without focal lesions or evidence of hydronephrosis.  10.4 cm in length.  Left Kidney:  Normal in echotexture appearance without focal lesions or evidence of hydronephrosis.  10.9 cm in length.  Abdominal aorta:  The proximal and mid abdominal aorta could not be visualized secondary to overlying bowel gas.  The distal aorta was normal in caliber (1.5 cm in diameter).  IMPRESSION: 1.  Examination was limited by extensive bowel gas, as above.  No definite acute findings noted. 2.  Specifically, despite the other limitations of this examination, the gallbladder was well visualized, and there are no signs of cholelithiasis or acute cholecystitis.  Original Report Authenticated By: Florencia Reasons, M.D.   Ct Abdomen Pelvis W Contrast  04/27/2011  *RADIOLOGY REPORT*  Clinical Data: Abdominal pain, vomiting.  CT ABDOMEN AND PELVIS WITH CONTRAST  Technique:  Multidetector CT imaging of the abdomen and pelvis was performed following the standard protocol  during bolus administration of intravenous contrast.  Contrast: OMNIPAQUE IOHEXOL 300 MG/ML IJ SOLN  Comparison: 08/23/2010  Findings: Calcified granuloma in the left lower lobe.  Lung bases otherwise clear.  No effusions.  Heart is borderline in size.  Liver, gallbladder, spleen, pancreas, adrenals and kidneys are unremarkable.  Scattered colonic diverticulosis, most pronounced in the descending  and sigmoid  colon.  No visible changes of active diverticulitis.  Prior appendectomy.  Small bowel is decompressed. Urinary bladder is unremarkable.  Prior hysterectomy.  No adnexal masses.  Aorta is nonaneurysmal.  No acute bony abnormality.  IMPRESSION: Old granulomas in the left lower lobe.  No acute findings in the abdomen or pelvis.  Colonic diverticulosis.  No active diverticulitis.  Original Report Authenticated By: Cyndie Chime, M.D.   Dg Fluoro Guide Lumbar Puncture  04/17/2011  *RADIOLOGY REPORT*  Clinical Data:  Empty sella.  Pseudotumor cerebri.  DIAGNOSTIC LUMBAR PUNCTURE UNDER FLUOROSCOPIC GUIDANCE  Fluoroscopy time:  12 seconds 0 minutes.  Technique:  Informed consent was obtained from the patient prior to the procedure, including potential complications of headache, allergy, and pain.   With the patient prone, the lower back was prepped with Betadine.  1% Lidocaine was used for local anesthesia. Lumbar puncture was performed at the L3-L4 level using a 3-1/2 inches 20 gauge needle with return of clear CSF with an opening pressure of seven cm water.   10 ml of CSF were obtained for laboratory studies.  The patient tolerated the procedure well and there were no apparent complications. Closing pressure was also 7 cm of water.  IMPRESSION:  1.  Successful L3-L4 fluoroscopically guided lumbar puncture. 2. Opening pressure of 7 cm of water.  Same closing pressure. 3.  10 ml CSF harvested.  Labs pending.  Original Report Authenticated By: Andreas Newport, M.D.     Today   Subjective:   Kathleen Sweeney today has no headache,no chest abdominal pain,no new weakness tingling or numbness, feels much better wants to go home today.    Objective:   Blood pressure 114/64, pulse 66, temperature 98.2 F (36.8 C), temperature source Oral, resp. rate 20, height 5\' 8"  (1.727 m), weight 81.1 kg (178 lb 12.7 oz), SpO2 98.00%.  Intake/Output Summary (Last 24 hours) at 04/28/11 1213 Last data filed at  04/28/11 0615  Gross per 24 hour  Intake  876.3 ml  Output      4 ml  Net  872.3 ml    Exam Awake Alert, Oriented *3, No new F.N deficits, Normal affect [place].AT,PERRAL Supple Neck,No JVD, No cervical lymphadenopathy appriciated.  Symmetrical Chest wall movement, Good air movement bilaterally, CTAB RRR,No Gallops,Rubs or new Murmurs, No Parasternal Heave +ve B.Sounds, Abd Soft, Non tender, No organomegaly appriciated, No rebound -guarding or rigidity. No Cyanosis, Clubbing or edema, No new Rash or bruise  Data Review      CBC w Diff: Lab Results  Component Value Date   WBC 7.0 04/28/2011   HGB 11.5* 04/28/2011   HCT 35.0* 04/28/2011   PLT 215 04/28/2011   LYMPHOPCT 8* 04/27/2011   MONOPCT 3 04/27/2011   EOSPCT 0 04/27/2011   BASOPCT 0 04/27/2011   CMP: Lab Results  Component Value Date   NA 140 04/28/2011   K 3.4* 04/28/2011   CL 108 04/28/2011   CO2 25 04/28/2011   BUN 7 04/28/2011   CREATININE 0.72 04/28/2011   PROT 6.1 04/28/2011   ALBUMIN 3.1* 04/28/2011   BILITOT 0.5 04/28/2011   ALKPHOS 67  04/28/2011   AST 34 04/28/2011   ALT 33 04/28/2011  .  Micro Results No results found for this or any previous visit (from the past 240 hour(s)).   Discharge Instructions     Follow with Primary MD Elie Confer, MD, MD in 4 days   Get CBC, CMP, TSH  checked 4 days by Primary MD and again as instructed by your Primary MD.    Get Medicines reviewed and adjusted.  Please request your Prim.MD to go over all Hospital Tests and Procedure/Radiological results at the follow up, please get all Hospital records sent to your Prim MD by signing hospital release before you go home.  Follow-up Information    Follow up with Elie Confer, MD. Schedule an appointment as soon as possible for a visit in 4 days.      Follow up with Your Neurologist at [facility]. Schedule an appointment as soon as possible for a visit in 1 week.         Activity: Fall precautions use  walker/cane & assistance as needed  Diet: Heart Healthy, Aspiration precautions.  Disposition Home  If you experience worsening of your admission symptoms, develop shortness of breath, life threatening emergency, suicidal or homicidal thoughts you must seek medical attention immediately by calling 911 or calling your MD immediately  if symptoms less severe.  You Must read complete instructions/literature along with all the possible adverse reactions/side effects for all the Medicines you take and that have been prescribed to you. Take any new Medicines after you have completely understood and accpet all the possible adverse reactions/side effects.   Do not drive if your were admitted for syncope or siezures until you have seen by Primary MD or a Neurologist and advised to drive.  Do not drive when taking Pain medications.    Do not take more than prescribed Pain, Sleep and Anxiety Medications  Special Instructions: If you have smoked or chewed Tobacco  in the last 2 yrs please stop smoking, stop any regular Alcohol  and or any Recreational drug use.  Wear Seat belts while driving.   Follow-up Information    Follow up with Elie Confer, MD. Schedule an appointment as soon as possible for a visit in 4 days.      Follow up with Your Neurologist at [facility]. Schedule an appointment as soon as possible for a visit in 1 week.         Discharge Medications   Medication List  As of 04/28/2011  1:02 PM   START taking these medications         ondansetron 4 MG tablet   Commonly known as: ZOFRAN   Take 1 tablet (4 mg total) by mouth every 8 (eight) hours as needed for nausea.         CONTINUE taking these medications         FLUoxetine 40 MG capsule   Commonly known as: PROZAC      levothyroxine 88 MCG tablet   Commonly known as: SYNTHROID, LEVOTHROID      tizanidine 6 MG capsule   Commonly known as: ZANAFLEX      zolpidem 12.5 MG CR tablet    Commonly known as: AMBIEN CR         STOP taking these medications         promethazine 25 MG/ML injection          Where to get your medications    These are the  prescriptions that you need to pick up.   You may get these medications from any pharmacy.         ondansetron 4 MG tablet           Total Time in preparing paper work, data evaluation and todays exam - 35 minutes  Leroy Sea M.D on 04/28/2011 at 12:13 PM  [facility] Office  [phone]

## 2011-05-02 LAB — URINE CULTURE
Culture  Setup Time: 201303161706
Special Requests: NORMAL

## 2011-08-03 ENCOUNTER — Other Ambulatory Visit (HOSPITAL_COMMUNITY): Payer: Self-pay | Admitting: Gastroenterology

## 2011-08-03 DIAGNOSIS — R1011 Right upper quadrant pain: Secondary | ICD-10-CM

## 2011-08-03 DIAGNOSIS — R110 Nausea: Secondary | ICD-10-CM

## 2011-08-08 ENCOUNTER — Other Ambulatory Visit: Payer: Self-pay | Admitting: Gastroenterology

## 2011-08-09 ENCOUNTER — Encounter (HOSPITAL_COMMUNITY)
Admission: RE | Admit: 2011-08-09 | Discharge: 2011-08-09 | Disposition: A | Discharge status: Home / Self Care | Payer: 59 | Source: Ambulatory Visit | Attending: Gastroenterology | Admitting: Gastroenterology

## 2011-08-09 DIAGNOSIS — R110 Nausea: Secondary | ICD-10-CM

## 2011-08-09 DIAGNOSIS — R1011 Right upper quadrant pain: Secondary | ICD-10-CM

## 2011-08-09 IMAGING — NM NM HEPATO W/GB/PHARM/[PERSON_NAME]
1 series · 6 of 6 positions shown · non-contrast
Comparison: CT and ultrasound [DATE]

CLINICAL DATA: Right upper quadrant pain.  Nausea.

NUCLEAR MEDICINE HEPATOBILIARY IMAGING WITH GALLBLADDER EF
TECHNIQUE: Sequential images of the abdomen were obtained [DATE]
minutes following intravenous administration of
radiopharmaceutical.  After slow intravenous infusion of [68]
Cholecystokinin, gallbladder ejection fraction was determined.
Radiopharmaceutical:  [68] [68] Choletec

[Series 1: gb hepatobiliary scan · 4.75mm/px · 6 of 60 frames shown]
[frame 6/60]
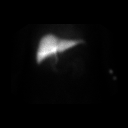
[frame 16/60]
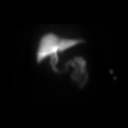
[frame 26/60]
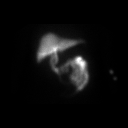
[frame 36/60]
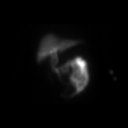
[frame 46/60]
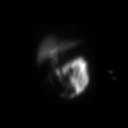
[frame 56/60]
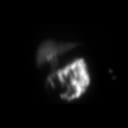

[6 of 6 positions shown; findings below may reference images not displayed]

FINDINGS: There is prompt uptake and excretion of radiotracer by
the liver.  No evidence of cystic duct or common duct obstruction.
Gallbladder ejection fraction is 46%.  Normal is greater than 30%.

The patient did not experience symptoms during CCK infusion.
IMPRESSION: Unremarkable study.

## 2011-08-09 IMAGING — NM NM HEPATO W/GB/PHARM/[PERSON_NAME]
1 series · 6 of 6 positions shown · non-contrast
Comparison: CT and ultrasound [DATE]

CLINICAL DATA: Right upper quadrant pain.  Nausea.

NUCLEAR MEDICINE HEPATOBILIARY IMAGING WITH GALLBLADDER EF
TECHNIQUE: Sequential images of the abdomen were obtained [DATE]
minutes following intravenous administration of
radiopharmaceutical.  After slow intravenous infusion of [68]
Cholecystokinin, gallbladder ejection fraction was determined.
Radiopharmaceutical:  [68] [68] Choletec

[Series 1: gallbladder · 4.46mm/px · 6 of 30 frames shown]
[frame 3/30]
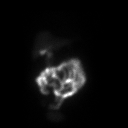
[frame 8/30]
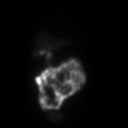
[frame 13/30]
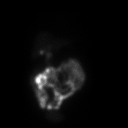
[frame 18/30]
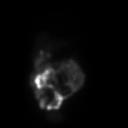
[frame 23/30]
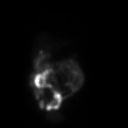
[frame 28/30]
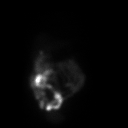

[6 of 6 positions shown; findings below may reference images not displayed]

FINDINGS: There is prompt uptake and excretion of radiotracer by
the liver.  No evidence of cystic duct or common duct obstruction.
Gallbladder ejection fraction is 46%.  Normal is greater than 30%.

The patient did not experience symptoms during CCK infusion.
IMPRESSION: Unremarkable study.

## 2011-08-09 MED ORDER — TECHNETIUM TC 99M MEBROFENIN IV KIT
5.1000 | PACK | Freq: Once | INTRAVENOUS | Status: AC | PRN
  Administered 2011-08-09: 5 via INTRAVENOUS

## 2014-01-15 IMAGING — CR DG CHEST 2V
2 series · 2 of 2 positions shown · non-contrast
Comparison: [DATE]

CLINICAL DATA: Nausea. Pain under the right breast. Cough beginning
late last night normal. Previous history of smoking.

EXAM:
CHEST  2 VIEW

[w chest pa]
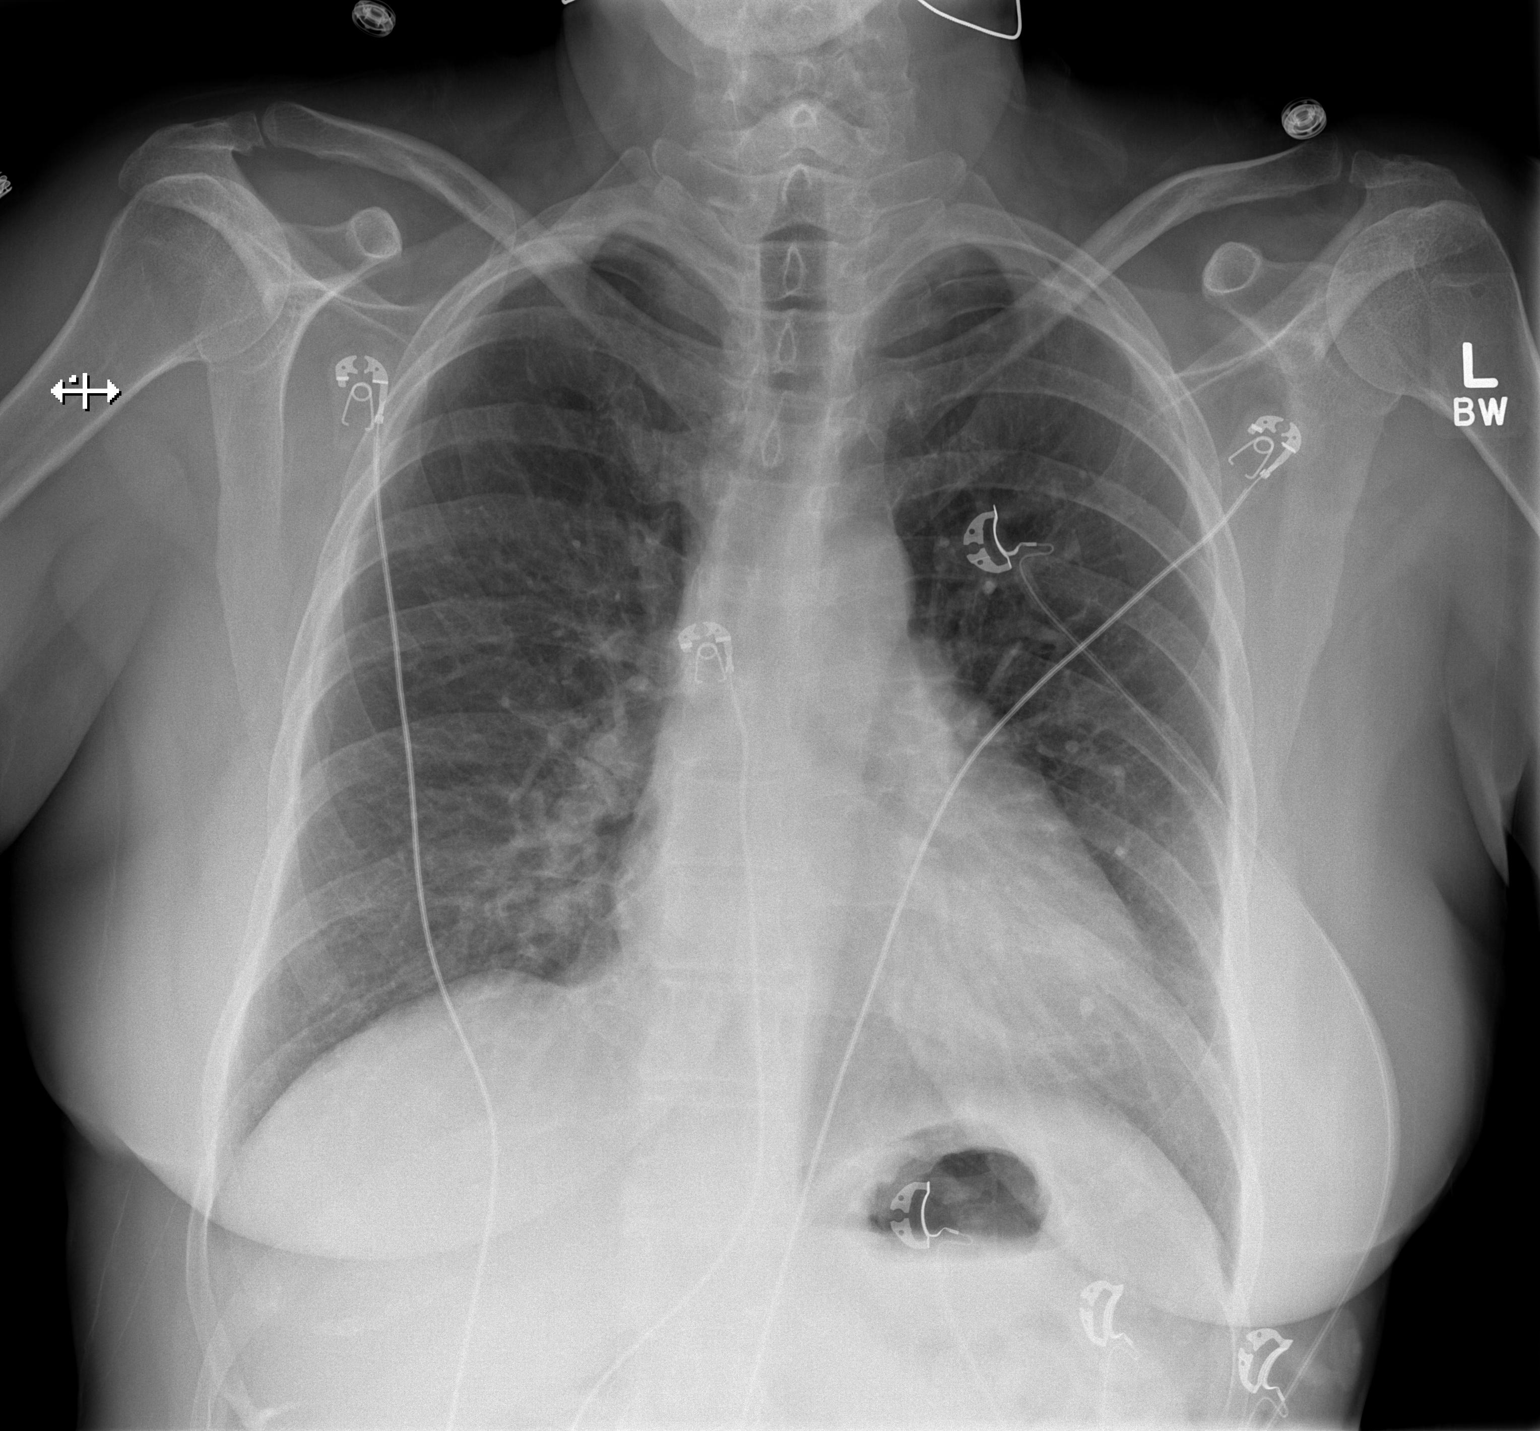

[w chest lat]
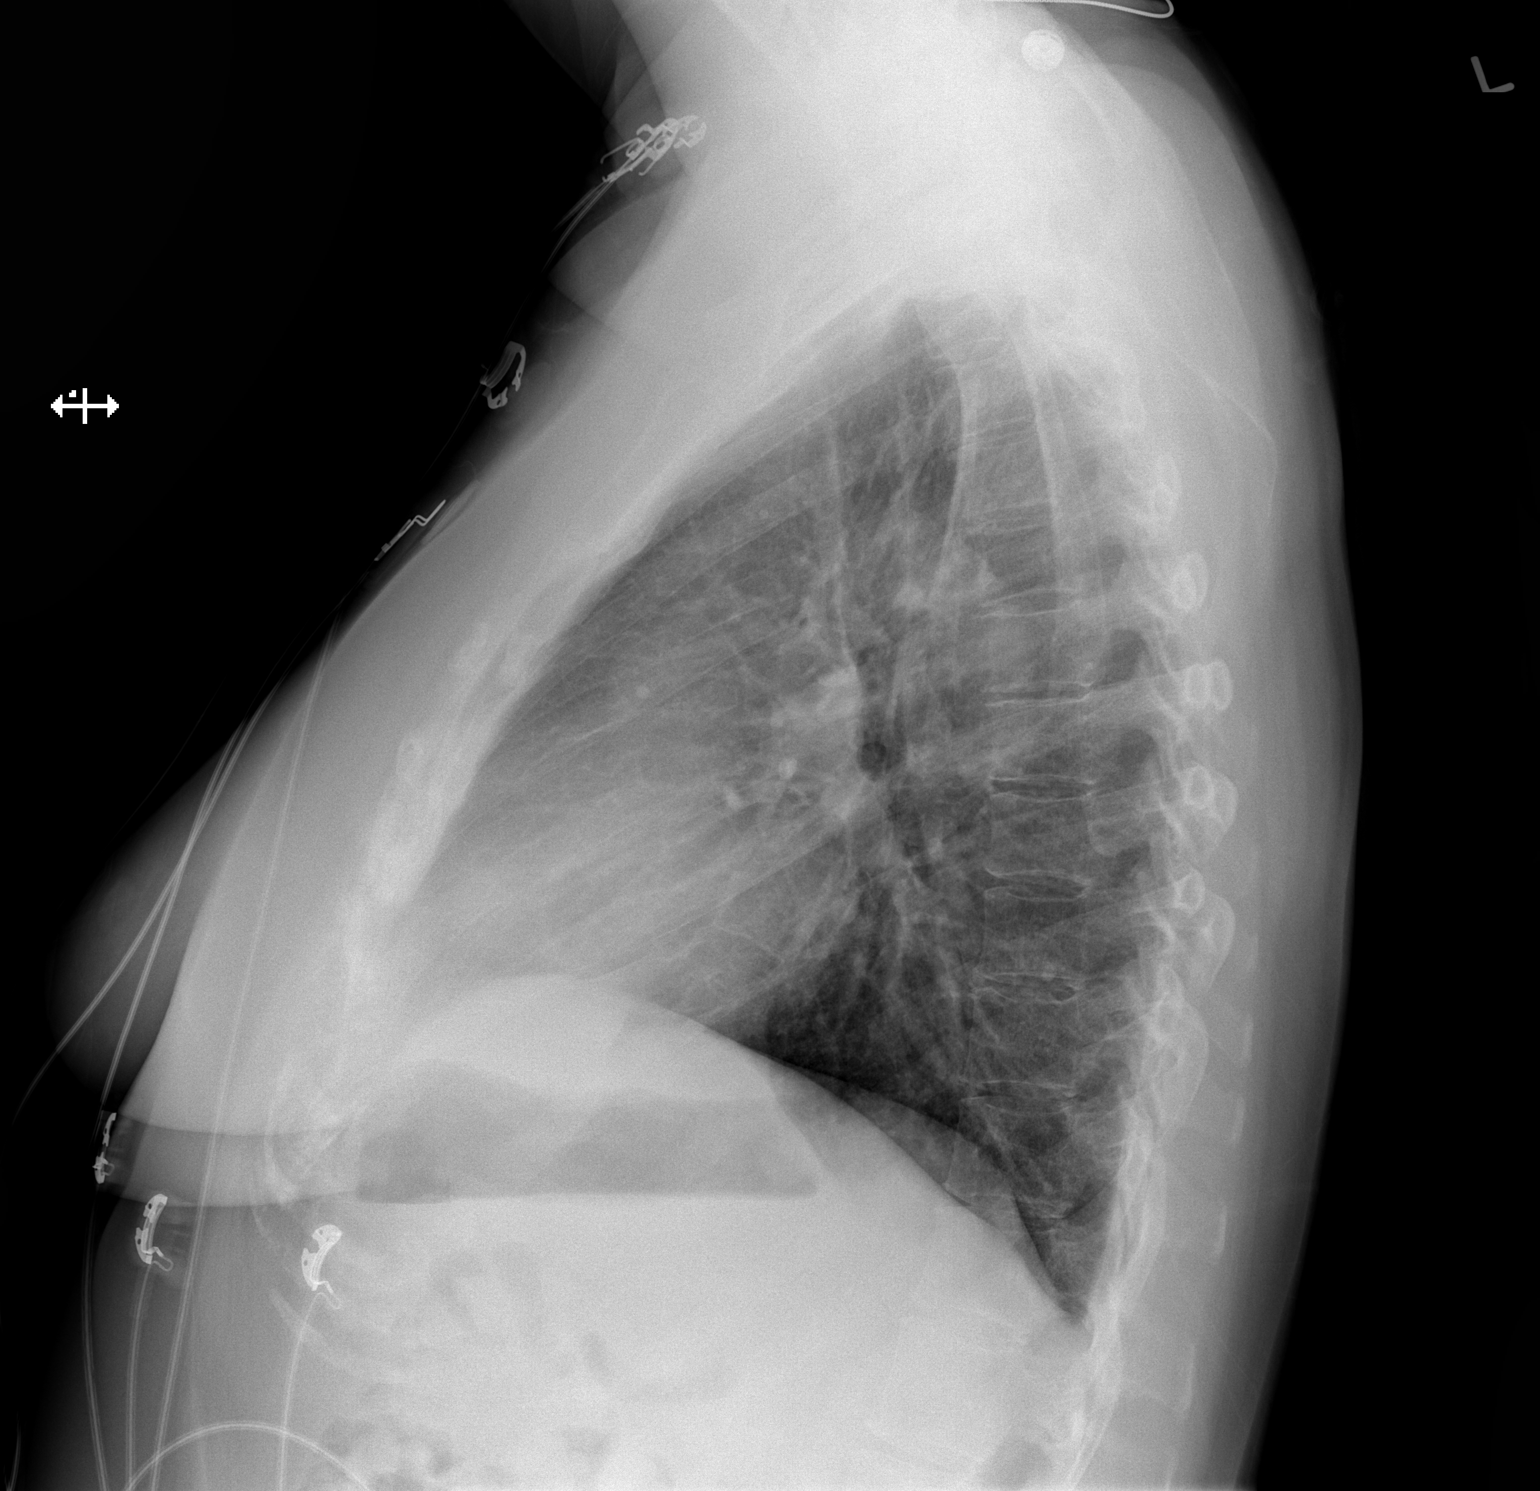

[2 of 2 positions shown; findings below may reference images not displayed]

FINDINGS: The heart size and mediastinal contours are within normal limits.
Both lungs are clear. No pleural effusion or pneumothorax. The
visualized skeletal structures are unremarkable.
IMPRESSION: No active cardiopulmonary disease.

## 2014-08-16 ENCOUNTER — Encounter (HOSPITAL_COMMUNITY): Payer: Self-pay | Admitting: Emergency Medicine

## 2014-08-16 ENCOUNTER — Emergency Department (HOSPITAL_COMMUNITY)
Admission: EM | Admit: 2014-08-16 | Discharge: 2014-08-16 | Disposition: A | Discharge status: Home / Self Care | Payer: 59 | Attending: Emergency Medicine | Admitting: Emergency Medicine

## 2014-08-16 ENCOUNTER — Emergency Department (HOSPITAL_COMMUNITY): Payer: 59

## 2014-08-16 DIAGNOSIS — E079 Disorder of thyroid, unspecified: Secondary | ICD-10-CM | POA: Insufficient documentation

## 2014-08-16 DIAGNOSIS — R1011 Right upper quadrant pain: Secondary | ICD-10-CM | POA: Insufficient documentation

## 2014-08-16 DIAGNOSIS — Z8739 Personal history of other diseases of the musculoskeletal system and connective tissue: Secondary | ICD-10-CM | POA: Insufficient documentation

## 2014-08-16 DIAGNOSIS — F329 Major depressive disorder, single episode, unspecified: Secondary | ICD-10-CM | POA: Diagnosis not present

## 2014-08-16 DIAGNOSIS — Z85828 Personal history of other malignant neoplasm of skin: Secondary | ICD-10-CM | POA: Insufficient documentation

## 2014-08-16 DIAGNOSIS — R112 Nausea with vomiting, unspecified: Secondary | ICD-10-CM | POA: Insufficient documentation

## 2014-08-16 DIAGNOSIS — Z880 Allergy status to penicillin: Secondary | ICD-10-CM | POA: Diagnosis not present

## 2014-08-16 DIAGNOSIS — Z8669 Personal history of other diseases of the nervous system and sense organs: Secondary | ICD-10-CM | POA: Insufficient documentation

## 2014-08-16 DIAGNOSIS — Z90710 Acquired absence of both cervix and uterus: Secondary | ICD-10-CM | POA: Diagnosis not present

## 2014-08-16 DIAGNOSIS — Z9049 Acquired absence of other specified parts of digestive tract: Secondary | ICD-10-CM | POA: Diagnosis not present

## 2014-08-16 DIAGNOSIS — R109 Unspecified abdominal pain: Secondary | ICD-10-CM

## 2014-08-16 LAB — CBC WITH DIFFERENTIAL/PLATELET
BASOS ABS: 0 10*3/uL (ref 0.0–0.1)
Basophils Relative: 0 % (ref 0–1)
EOS ABS: 0 10*3/uL (ref 0.0–0.7)
Eosinophils Relative: 0 % (ref 0–5)
HEMATOCRIT: 43.1 % (ref 36.0–46.0)
Hemoglobin: 13.8 g/dL (ref 12.0–15.0)
Lymphocytes Relative: 11 % — ABNORMAL LOW (ref 12–46)
Lymphs Abs: 1.2 10*3/uL (ref 0.7–4.0)
MCH: 27.1 pg (ref 26.0–34.0)
MCHC: 32 g/dL (ref 30.0–36.0)
MCV: 84.7 fL (ref 78.0–100.0)
Monocytes Absolute: 0.4 10*3/uL (ref 0.1–1.0)
Monocytes Relative: 4 % (ref 3–12)
Neutro Abs: 9.3 10*3/uL — ABNORMAL HIGH (ref 1.7–7.7)
Neutrophils Relative %: 85 % — ABNORMAL HIGH (ref 43–77)
PLATELETS: 255 10*3/uL (ref 150–400)
RBC: 5.09 MIL/uL (ref 3.87–5.11)
RDW: 14.1 % (ref 11.5–15.5)
WBC: 11 10*3/uL — ABNORMAL HIGH (ref 4.0–10.5)

## 2014-08-16 LAB — COMPREHENSIVE METABOLIC PANEL
ALT: 8 U/L — AB (ref 14–54)
ANION GAP: 7 (ref 5–15)
AST: 20 U/L (ref 15–41)
Albumin: 4.2 g/dL (ref 3.5–5.0)
Alkaline Phosphatase: 72 U/L (ref 38–126)
BILIRUBIN TOTAL: 0.4 mg/dL (ref 0.3–1.2)
BUN: 10 mg/dL (ref 6–20)
CALCIUM: 9.1 mg/dL (ref 8.9–10.3)
CO2: 28 mmol/L (ref 22–32)
CREATININE: 1.03 mg/dL — AB (ref 0.44–1.00)
Chloride: 102 mmol/L (ref 101–111)
GFR calc non Af Amer: 60 mL/min — ABNORMAL LOW (ref >60)
Glucose, Bld: 146 mg/dL — ABNORMAL HIGH (ref 65–99)
POTASSIUM: 3.7 mmol/L (ref 3.5–5.1)
Sodium: 137 mmol/L (ref 135–145)
TOTAL PROTEIN: 7.9 g/dL (ref 6.5–8.1)

## 2014-08-16 LAB — URINALYSIS, ROUTINE W REFLEX MICROSCOPIC
Glucose, UA: NEGATIVE mg/dL
Hgb urine dipstick: NEGATIVE
Ketones, ur: NEGATIVE mg/dL
Leukocytes, UA: NEGATIVE
Nitrite: NEGATIVE
Protein, ur: NEGATIVE mg/dL
Specific Gravity, Urine: 1.023 (ref 1.005–1.030)
Urobilinogen, UA: 0.2 mg/dL (ref 0.0–1.0)
pH: 6 (ref 5.0–8.0)

## 2014-08-16 LAB — I-STAT TROPONIN, ED: Troponin i, poc: 0.01 ng/mL (ref 0.00–0.08)

## 2014-08-16 LAB — LIPASE, BLOOD: LIPASE: 19 U/L — AB (ref 22–51)

## 2014-08-16 IMAGING — CT CT ABD-PELV W/ CM
2 of 5 series · 15 of 46 positions shown, 17 images · IV contrast (OMNIPAQUE 300)
Comparison: CT abdomen and pelvis from [DATE]

CLINICAL DATA: Acute onset of right upper quadrant abdominal pain,
radiating to the central chest and right jaw. Vomiting. Initial
encounter.

EXAM:
CT ABDOMEN AND PELVIS WITH CONTRAST
TECHNIQUE: Multidetector CT imaging of the abdomen and pelvis was performed
using the standard protocol following bolus administration of
intravenous contrast.
CONTRAST:  100mL OMNIPAQUE IOHEXOL 300 MG/ML  SOLN

[Series 2: abd/pel with · axial · 0.82mm/px · z∈[+1110,+1510]mm · 12 of 90 slices shown, 14 images]
[im 5/90  soft-tissue]
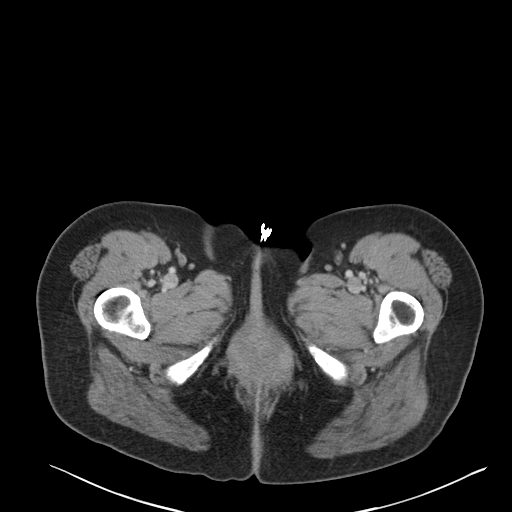
[im 5/90  bone]
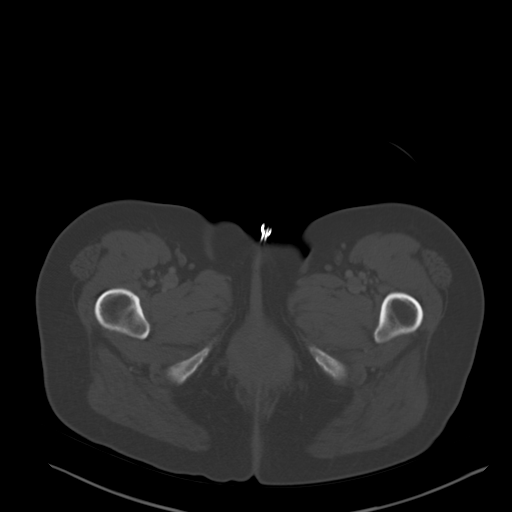
[im 15/90  soft-tissue]
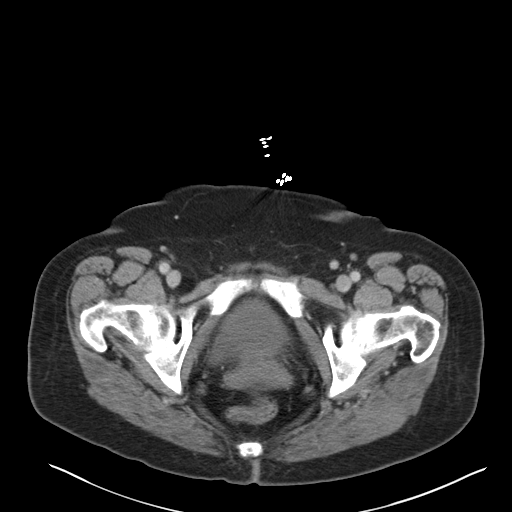
[im 19/90  soft-tissue]
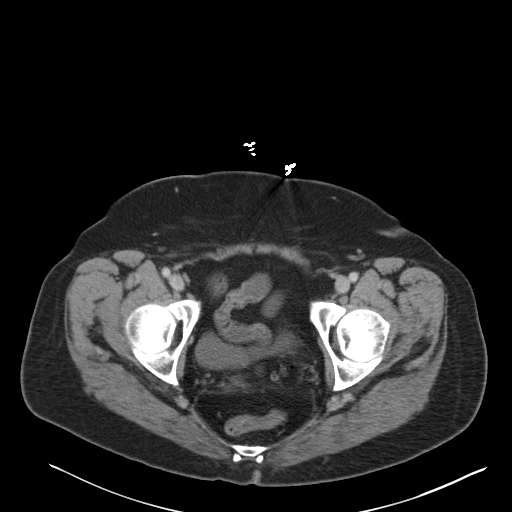
[im 29/90  soft-tissue]
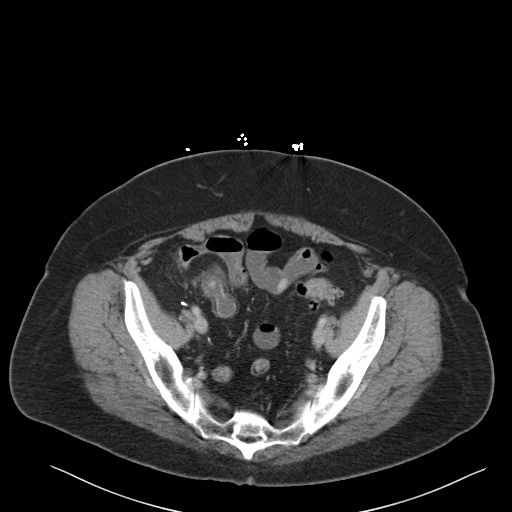
[im 33/90  soft-tissue]
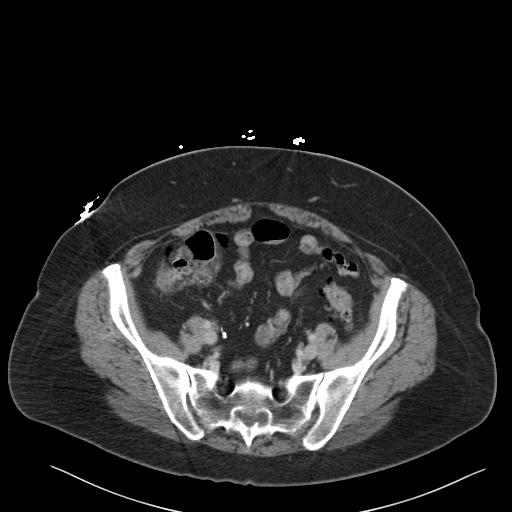
[im 43/90  soft-tissue]
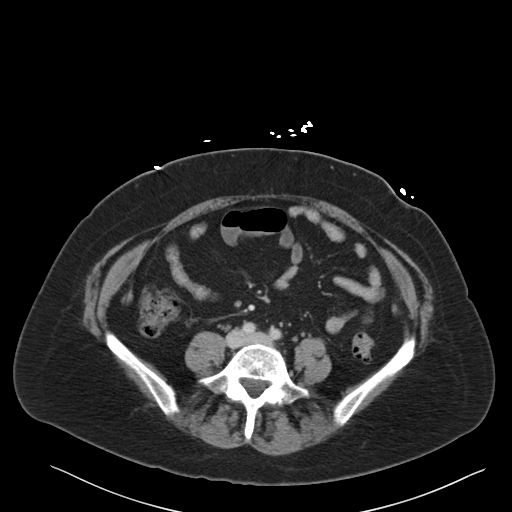
[im 47/90  soft-tissue]
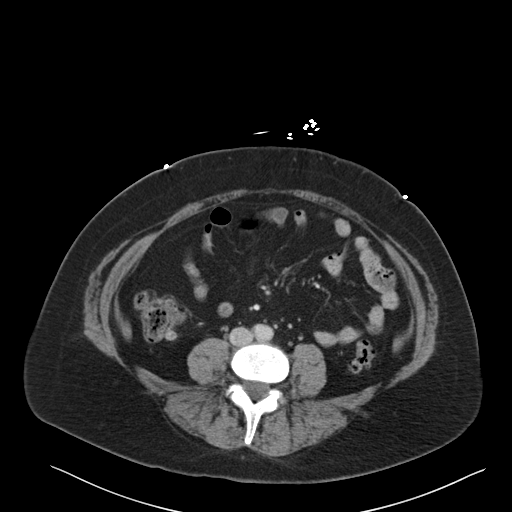
[im 57/90  soft-tissue]
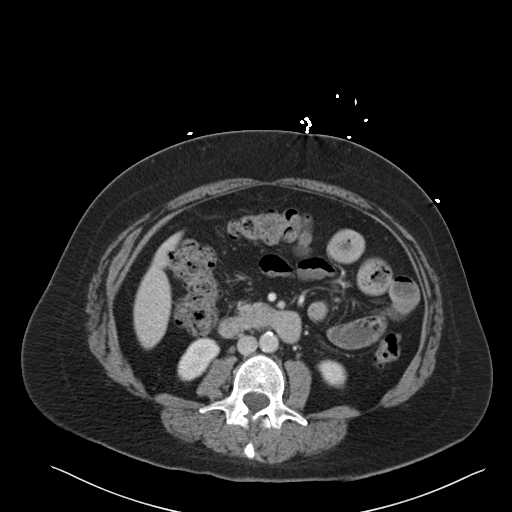
[im 61/90  soft-tissue]
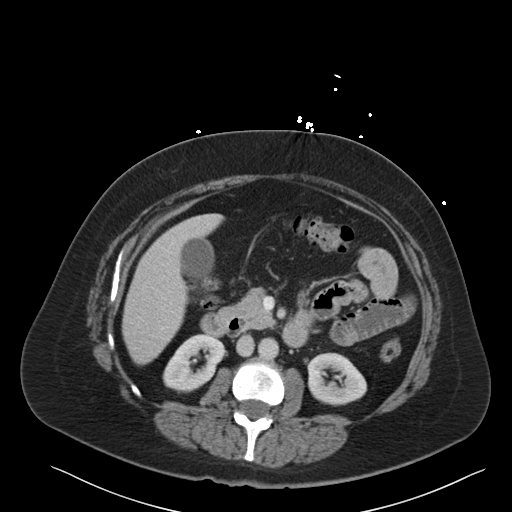
[im 61/90  bone]
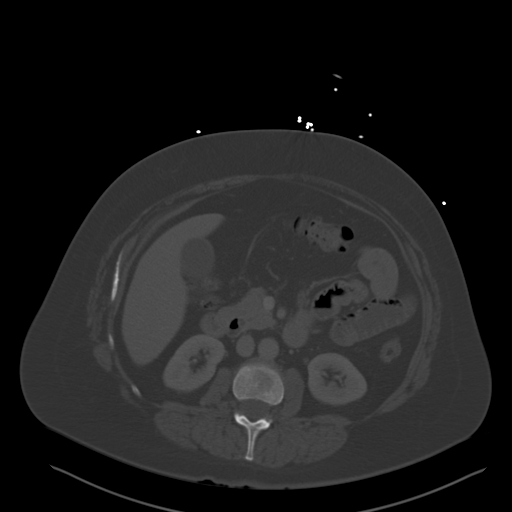
[im 71/90  soft-tissue]
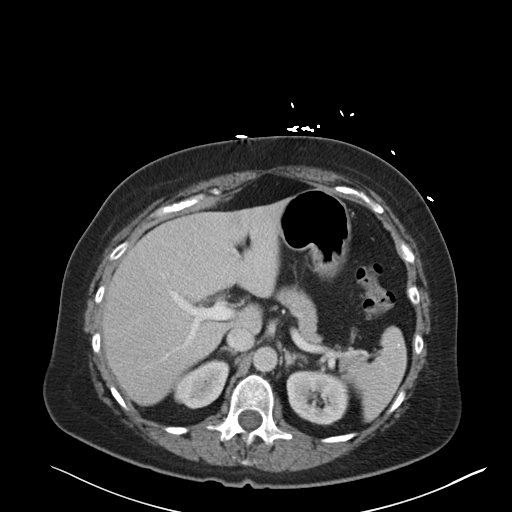
[im 75/90  soft-tissue]
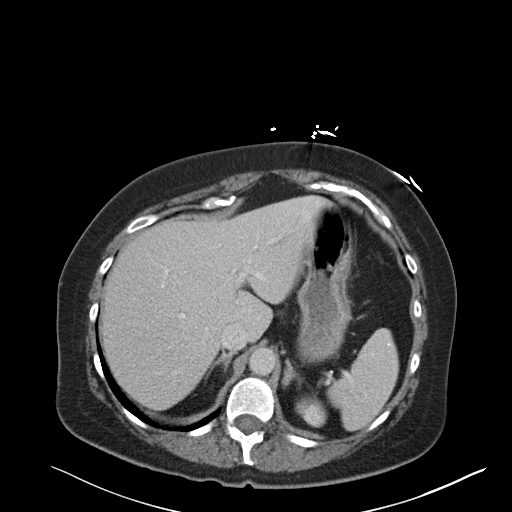
[im 85/90  soft-tissue]
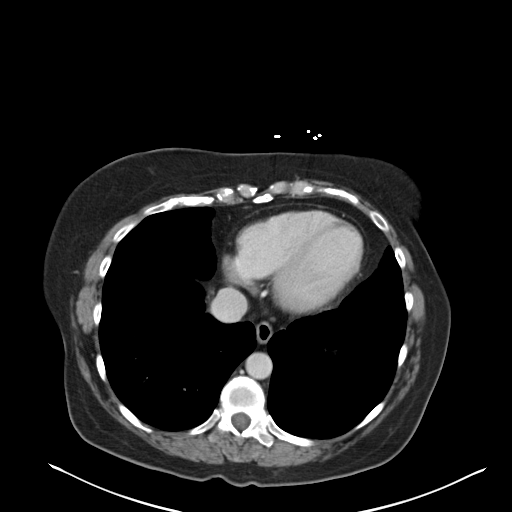

[Series 4: coronal a/|p · coronal · 0.74mm/px · 3 of 125 slices shown]
[im 42/125  soft-tissue]
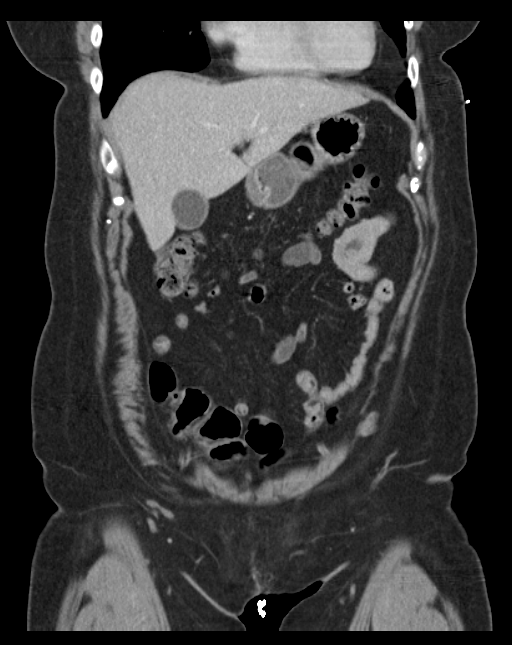
[im 56/125  soft-tissue]
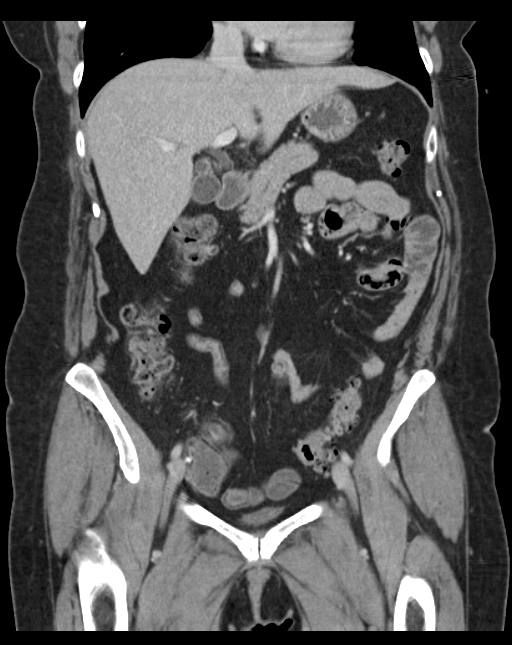
[im 69/125  soft-tissue]
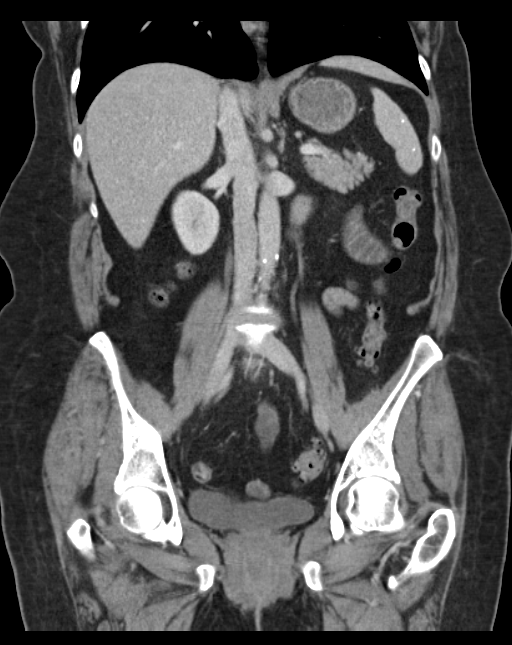

[15 of 46 positions shown; findings below may reference images not displayed]

FINDINGS: The visualized lung bases are clear.

Apparent wall thickening is noted along the antrum of stomach,
adjacent to the pylorus. This is thought to reflect relative
decompression, though endoscopy would be helpful for further
evaluation, to exclude an underlying mass.

The liver is unremarkable in appearance. A few scattered small
calcified granulomata are seen within the spleen. The gallbladder is
within normal limits. The pancreas and adrenal glands are
unremarkable.

The kidneys are unremarkable in appearance. There is no evidence of
hydronephrosis. No renal or ureteral stones are seen. No perinephric
stranding is appreciated.

No free fluid is identified. The small bowel is unremarkable in
appearance. No acute vascular abnormalities are seen. Mild scattered
calcification is seen along the abdominal aorta.

The patient is status post appendectomy.

Mild fatty infiltration along the distal ileum may reflect sequelae
of chronic inflammation. There is also fatty infiltration at the
cecum. Scattered diverticulosis is noted along the entirety of the
colon, with partial sparing of the transverse colon. There is no
evidence of acute diverticulitis.

The bladder is mildly distended and grossly unremarkable. The
patient is status post hysterectomy. No suspicious adnexal masses
are seen. No inguinal lymphadenopathy is seen.

No acute osseous abnormalities are identified. Vacuum phenomenon is
noted at L5-S1; this can be a normal finding.
IMPRESSION: 1. Apparent wall thickening along the antrum of the stomach,
adjacent to the pylorus. This is thought to reflect relative
decompression, though endoscopy would be helpful for further
evaluation, to exclude an underlying mass.
2. Scattered diverticulosis along the entirety of the colon, with
partial sparing along the transverse colon. No evidence for acute
diverticulitis. Mild fatty infiltration along the distal ileum may
reflect mild sequelae of chronic inflammation.
3. Mild scattered calcification along the abdominal aorta.

## 2014-08-16 MED ORDER — ONDANSETRON HCL 4 MG/2ML IJ SOLN
4.0000 mg | Freq: Once | INTRAMUSCULAR | Status: AC
  Administered 2014-08-16: 4 mg via INTRAVENOUS
  Filled 2014-08-16: qty 2

## 2014-08-16 MED ORDER — SODIUM CHLORIDE 0.9 % IV BOLUS (SEPSIS)
1000.0000 mL | Freq: Once | INTRAVENOUS | Status: AC
  Administered 2014-08-16: 1000 mL via INTRAVENOUS

## 2014-08-16 MED ORDER — IOHEXOL 300 MG/ML  SOLN
25.0000 mL | Freq: Once | INTRAMUSCULAR | Status: AC | PRN
  Administered 2014-08-16: 25 mL via ORAL

## 2014-08-16 MED ORDER — ONDANSETRON HCL 4 MG PO TABS: 4.0000 mg | ORAL_TABLET | Freq: Three times a day (TID) | ORAL | Status: DC | PRN

## 2014-08-16 MED ORDER — HYDROMORPHONE HCL 1 MG/ML IJ SOLN
1.0000 mg | Freq: Once | INTRAMUSCULAR | Status: AC
  Administered 2014-08-16: 1 mg via INTRAVENOUS
  Filled 2014-08-16: qty 1

## 2014-08-16 MED ORDER — HYDROMORPHONE HCL 1 MG/ML IJ SOLN
1.0000 mg | Freq: Once | INTRAMUSCULAR | Status: AC
Start: 2014-08-16 — End: 2014-08-16
  Administered 2014-08-16: 1 mg via INTRAVENOUS
  Filled 2014-08-16: qty 1

## 2014-08-16 MED ORDER — IOHEXOL 300 MG/ML  SOLN
100.0000 mL | Freq: Once | INTRAMUSCULAR | Status: AC | PRN
  Administered 2014-08-16: 100 mL via INTRAVENOUS

## 2014-08-16 MED ORDER — OMEPRAZOLE MAGNESIUM 10 MG PO PACK: 20.0000 mg | PACK | Freq: Every day | ORAL | Status: DC

## 2014-08-16 NOTE — Discharge Instructions (Signed)
Please contact both her primary care and gastroenterologist and inform them of your visit today and all relevant information. Please inform gastroenterologist about CT results. Please monitor for new or worsening signs or symptoms return immediately if any present.

## 2014-08-16 NOTE — ED Notes (Signed)
Bed: WA14 Expected date:  Expected time:  Means of arrival:  Comments: triage 

## 2014-08-16 NOTE — ED Notes (Signed)
RN Margreta Journey starting IV currently

## 2014-08-16 NOTE — ED Provider Notes (Signed)
CSN: [id number]     Arrival date & time 08/16/14  1759 History   First MD Initiated Contact with Patient 08/16/14 1810     Chief Complaint  Patient presents with  . Abdominal Pain  . Nausea  . Emesis    HPI   54 year old female presents today with abdominal pain nausea vomiting. Patient reports today she felt nauseous tried eating and had immediate vomiting. Patient reports the pain is located in the right upper quadrant right flank and back. She's had multiple episodes of vomiting, and reports associated chest pain. Nursing notes report pain in her neck, patient notes this is throat pain from the vomiting.  Patient has a history of diverticulitis, reports this does not feel similar. Patient reports she's had similar type pain previously without acute diverticolosis.. Patient additionally been feeling well until today. Denies alcohol or drug use. Patient's last hospital admission was 04/28/2011 for migraine headache with intractable nausea and vomiting.     Past Medical History  Diagnosis Date  . Fibromyalgia   . Depression   . Thyroid disease   . Migraine   . Skin cancer Had cancer removed in 2000   Past Surgical History  Procedure Laterality Date  . Abdominal hysterectomy    . Appendectomy     No family history on file. History  Substance Use Topics  . Smoking status: Never Smoker   . Smokeless tobacco: Never Used  . Alcohol Use: No   OB History    No data available     Review of Systems  All other systems reviewed and are negative.   Allergies  Erythromycin; Morphine and related; Penicillins; and Promethazine  Home Medications   Prior to Admission medications   Medication Sig Start Date End Date Taking? Authorizing Provider  Azelaic Acid (FINACEA) 15 % cream Apply 1 application topically 2 (two) times daily. After skin is thoroughly washed and patted dry, gently but thoroughly massage a thin film of azelaic acid cream into the affected area twice daily, in the  morning and evening.   Yes Historical Provider, MD  busPIRone (BUSPAR) 15 MG tablet Take 15 tablets by mouth 2 (two) times daily. 08/04/14  Yes Historical Provider, MD  FLUoxetine (PROZAC) 40 MG capsule Take 40 mg by mouth daily.   Yes Historical Provider, MD  levothyroxine (SYNTHROID, LEVOTHROID) 88 MCG tablet Take 88 mcg by mouth daily.   Yes Historical Provider, MD  LORazepam (ATIVAN) 0.5 MG tablet Take 0.5 mg by mouth 2 (two) times daily as needed. anxiety 07/09/14  Yes Historical Provider, MD  MINIVELLE 0.1 MG/24HR patch Place 1 patch onto the skin every 3 (three) days. But pt states she usually lets it on for 4 days 07/05/14  Yes Historical Provider, MD  tizanidine (ZANAFLEX) 6 MG capsule Take 6 mg by mouth 2 (two) times daily.   Yes Historical Provider, MD  zolpidem (AMBIEN CR) 12.5 MG CR tablet Take 12.5 mg by mouth at bedtime as needed.   Yes Historical Provider, MD  Omeprazole Magnesium (PRILOSEC) 10 MG PACK Take 20 mg by mouth daily. 08/16/14   Dellis Filbert Burns Timson, PA-C  ondansetron (ZOFRAN) 4 MG tablet Take 1 tablet (4 mg total) by mouth every 8 (eight) hours as needed for nausea or vomiting. 08/16/14   Airabella Barley, PA-C   BP 141/65 mmHg  Pulse 71  Temp(Src) 98.6 F (37 C) (Oral)  Resp 15  SpO2 97% Physical Exam  Constitutional: She is oriented to person, place, and time. She appears well-developed  and well-nourished.  HENT:  Head: Normocephalic and atraumatic.  Eyes: Pupils are equal, round, and reactive to light.  Neck: Normal range of motion. Neck supple. No JVD present. No tracheal deviation present. No thyromegaly present.  Cardiovascular: Normal rate, regular rhythm, normal heart sounds and intact distal pulses.  Exam reveals no gallop and no friction rub.   No murmur heard. Pulmonary/Chest: Effort normal and breath sounds normal. No stridor. No respiratory distress. She has no wheezes. She has no rales. She exhibits no tenderness.  Abdominal: She exhibits no distension and no  mass. There is tenderness in the right upper quadrant. There is no rebound and no guarding.  Musculoskeletal: Normal range of motion.  Lymphadenopathy:    She has no cervical adenopathy.  Neurological: She is alert and oriented to person, place, and time. Coordination normal.  Skin: Skin is warm and dry.  Psychiatric: She has a normal mood and affect. Her behavior is normal. Judgment and thought content normal.  Nursing note and vitals reviewed.   ED Course  Procedures (including critical care time) Labs Review Labs Reviewed  CBC WITH DIFFERENTIAL/PLATELET - Abnormal; Notable for the following:    WBC 11.0 (*)    Neutrophils Relative % 85 (*)    Neutro Abs 9.3 (*)    Lymphocytes Relative 11 (*)    All other components within normal limits  COMPREHENSIVE METABOLIC PANEL - Abnormal; Notable for the following:    Glucose, Bld 146 (*)    Creatinine, Ser 1.03 (*)    ALT 8 (*)    GFR calc non Af Amer 60 (*)    All other components within normal limits  LIPASE, BLOOD - Abnormal; Notable for the following:    Lipase 19 (*)    All other components within normal limits  URINALYSIS, ROUTINE W REFLEX MICROSCOPIC (NOT AT [facility]) - Abnormal; Notable for the following:    APPearance CLOUDY (*)    Bilirubin Urine SMALL (*)    All other components within normal limits  I-STAT TROPOININ, ED  I-STAT TROPOININ, ED    Imaging Review Ct Abdomen Pelvis W Contrast  08/16/2014   CLINICAL DATA:  Acute onset of right upper quadrant abdominal pain, radiating to the central chest and right jaw. Vomiting. Initial encounter.  EXAM: CT ABDOMEN AND PELVIS WITH CONTRAST  TECHNIQUE: Multidetector CT imaging of the abdomen and pelvis was performed using the standard protocol following bolus administration of intravenous contrast.  CONTRAST:  158mL OMNIPAQUE IOHEXOL 300 MG/ML  SOLN  COMPARISON:  CT abdomen and pelvis from 04/27/2011  FINDINGS: The visualized lung bases are clear.  Apparent wall thickening is noted  along the antrum of stomach, adjacent to the pylorus. This is thought to reflect relative decompression, though endoscopy would be helpful for further evaluation, to exclude an underlying mass.  The liver is unremarkable in appearance. A few scattered small calcified granulomata are seen within the spleen. The gallbladder is within normal limits. The pancreas and adrenal glands are unremarkable.  The kidneys are unremarkable in appearance. There is no evidence of hydronephrosis. No renal or ureteral stones are seen. No perinephric stranding is appreciated.  No free fluid is identified. The small bowel is unremarkable in appearance. No acute vascular abnormalities are seen. Mild scattered calcification is seen along the abdominal aorta.  The patient is status post appendectomy.  Mild fatty infiltration along the distal ileum may reflect sequelae of chronic inflammation. There is also fatty infiltration at the cecum. Scattered diverticulosis is noted along  the entirety of the colon, with partial sparing of the transverse colon. There is no evidence of acute diverticulitis.  The bladder is mildly distended and grossly unremarkable. The patient is status post hysterectomy. No suspicious adnexal masses are seen. No inguinal lymphadenopathy is seen.  No acute osseous abnormalities are identified. Vacuum phenomenon is noted at L5-S1; this can be a normal finding.  IMPRESSION: 1. Apparent wall thickening along the antrum of the stomach, adjacent to the pylorus. This is thought to reflect relative decompression, though endoscopy would be helpful for further evaluation, to exclude an underlying mass. 2. Scattered diverticulosis along the entirety of the colon, with partial sparing along the transverse colon. No evidence for acute diverticulitis. Mild fatty infiltration along the distal ileum may reflect mild sequelae of chronic inflammation. 3. Mild scattered calcification along the abdominal aorta.   Electronically Signed    By: Garald Balding M.D.   On: 08/16/2014 19:45     EKG Interpretation   Date/Time:  Monday August 16 2014 18:12:09 EDT Ventricular Rate:  57 PR Interval:  186 QRS Duration: 97 QT Interval:  432 QTC Calculation: 421 R Axis:   -2 Text Interpretation:  Sinus rhythm RSR' in V1 or V2, right VCD or RVH ED  PHYSICIAN INTERPRETATION AVAILABLE IN [facility] Confirmed by TEST,  Record ([id number]) on 08/17/2014 6:43:35 AM      MDM   Final diagnoses:  Abdominal pain, unspecified abdominal location  Non-intractable vomiting with nausea, vomiting of unspecified type    Labs:I stat trop, CBC, CMP, Lipase, UA- see above  Imaging: CT ABd Pelvis-  Consults:  Therapeutics:  Assessment:  Plan: Patient presents with an acute onset of abdominal pain, nausea, vomiting. Patient originally reported that she has had 2 episodes similar to this with significant workup without a cause. Patient reports they've resolved on their own. Apparently thickening along the antrum of the stomach was found today, patient reports this is well-known to her as it was present at her last, she reports she reports endoscopy with no concerning findings. Patient tolerating by mouth, no significant findings on labs that would indicate further evaluation or management here in the ED. Patient was significantly improved, she was discharged home with Zofran, and strict return precautions, instructions follow-up with her primary care gastroenterologist for further evaluation or management. Patient's original chest complaints, unlikely be cardiac in nature, negative troponin and normal EKG, low ACS risk factors.      Okey Regal, PA-C 08/18/14 [place], MD 08/24/14 [phone]

## 2014-08-16 NOTE — ED Notes (Signed)
Patient in with complaints of abd pain all over, nausea, vomiting that started around noon after eating. Hx diverticulitis.

## 2014-08-16 NOTE — ED Notes (Signed)
Pt is actively vomiting during assessment.  Hx of diverticulosis, but pt reports that this current pain feels different.  RUQ abdominal pain is radiating into central chest and right jaw. Pain 10/10, sharp/radiating.

## 2014-09-01 ENCOUNTER — Other Ambulatory Visit: Payer: Self-pay | Admitting: Gastroenterology

## 2014-10-25 ENCOUNTER — Emergency Department (HOSPITAL_COMMUNITY)
Admission: EM | Admit: 2014-10-25 | Discharge: 2014-10-25 | Disposition: A | Discharge status: Home / Self Care | Payer: 59 | Attending: Emergency Medicine | Admitting: Emergency Medicine

## 2014-10-25 ENCOUNTER — Encounter (HOSPITAL_COMMUNITY): Payer: Self-pay | Admitting: Emergency Medicine

## 2014-10-25 ENCOUNTER — Emergency Department (HOSPITAL_COMMUNITY): Payer: 59

## 2014-10-25 DIAGNOSIS — Z85828 Personal history of other malignant neoplasm of skin: Secondary | ICD-10-CM | POA: Insufficient documentation

## 2014-10-25 DIAGNOSIS — F329 Major depressive disorder, single episode, unspecified: Secondary | ICD-10-CM | POA: Diagnosis not present

## 2014-10-25 DIAGNOSIS — Z8679 Personal history of other diseases of the circulatory system: Secondary | ICD-10-CM | POA: Insufficient documentation

## 2014-10-25 DIAGNOSIS — Z79899 Other long term (current) drug therapy: Secondary | ICD-10-CM | POA: Insufficient documentation

## 2014-10-25 DIAGNOSIS — M797 Fibromyalgia: Secondary | ICD-10-CM | POA: Insufficient documentation

## 2014-10-25 DIAGNOSIS — R1010 Upper abdominal pain, unspecified: Secondary | ICD-10-CM | POA: Diagnosis present

## 2014-10-25 DIAGNOSIS — Z9089 Acquired absence of other organs: Secondary | ICD-10-CM | POA: Insufficient documentation

## 2014-10-25 DIAGNOSIS — E079 Disorder of thyroid, unspecified: Secondary | ICD-10-CM | POA: Insufficient documentation

## 2014-10-25 DIAGNOSIS — R1013 Epigastric pain: Secondary | ICD-10-CM | POA: Insufficient documentation

## 2014-10-25 DIAGNOSIS — R112 Nausea with vomiting, unspecified: Secondary | ICD-10-CM | POA: Diagnosis not present

## 2014-10-25 DIAGNOSIS — Z880 Allergy status to penicillin: Secondary | ICD-10-CM | POA: Diagnosis not present

## 2014-10-25 DIAGNOSIS — Z90710 Acquired absence of both cervix and uterus: Secondary | ICD-10-CM | POA: Diagnosis not present

## 2014-10-25 HISTORY — DX: Systemic lupus erythematosus, unspecified: M32.9

## 2014-10-25 HISTORY — DX: Reserved for concepts with insufficient information to code with codable children: IMO0002

## 2014-10-25 LAB — COMPREHENSIVE METABOLIC PANEL
ALT: 11 U/L — ABNORMAL LOW (ref 14–54)
AST: 24 U/L (ref 15–41)
Albumin: 4.2 g/dL (ref 3.5–5.0)
Alkaline Phosphatase: 70 U/L (ref 38–126)
Anion gap: 10 (ref 5–15)
BUN: 15 mg/dL (ref 6–20)
CO2: 25 mmol/L (ref 22–32)
Calcium: 9.1 mg/dL (ref 8.9–10.3)
Chloride: 101 mmol/L (ref 101–111)
Creatinine, Ser: 1.04 mg/dL — ABNORMAL HIGH (ref 0.44–1.00)
GFR calc Af Amer: >60 mL/min (ref >60)
GFR calc non Af Amer: 59 mL/min — ABNORMAL LOW (ref >60)
Glucose, Bld: 189 mg/dL — ABNORMAL HIGH (ref 65–99)
Potassium: 3.9 mmol/L (ref 3.5–5.1)
Sodium: 136 mmol/L (ref 135–145)
Total Bilirubin: 0.4 mg/dL (ref 0.3–1.2)
Total Protein: 8.1 g/dL (ref 6.5–8.1)

## 2014-10-25 LAB — CBC
HCT: 43.1 % (ref 36.0–46.0)
Hemoglobin: 13.8 g/dL (ref 12.0–15.0)
MCH: 27.3 pg (ref 26.0–34.0)
MCHC: 32 g/dL (ref 30.0–36.0)
MCV: 85.2 fL (ref 78.0–100.0)
Platelets: 222 10*3/uL (ref 150–400)
RBC: 5.06 MIL/uL (ref 3.87–5.11)
RDW: 13.6 % (ref 11.5–15.5)
WBC: 11.8 10*3/uL — ABNORMAL HIGH (ref 4.0–10.5)

## 2014-10-25 LAB — LIPASE, BLOOD: Lipase: 20 U/L — ABNORMAL LOW (ref 22–51)

## 2014-10-25 MED ORDER — OXYCODONE-ACETAMINOPHEN 5-325 MG PO TABS
1.0000 | ORAL_TABLET | ORAL | Status: DC | PRN
Start: 2014-10-25 — End: 2018-01-22

## 2014-10-25 MED ORDER — PROCHLORPERAZINE EDISYLATE 5 MG/ML IJ SOLN
10.0000 mg | Freq: Four times a day (QID) | INTRAMUSCULAR | Status: DC | PRN
  Administered 2014-10-25: 10 mg via INTRAVENOUS
  Filled 2014-10-25: qty 2

## 2014-10-25 MED ORDER — DIPHENHYDRAMINE HCL 50 MG/ML IJ SOLN
12.5000 mg | Freq: Once | INTRAMUSCULAR | Status: AC
  Administered 2014-10-25: 12.5 mg via INTRAVENOUS
  Filled 2014-10-25: qty 1

## 2014-10-25 MED ORDER — HYDROMORPHONE HCL 1 MG/ML IJ SOLN
1.0000 mg | Freq: Once | INTRAMUSCULAR | Status: AC
  Administered 2014-10-25: 1 mg via INTRAVENOUS
  Filled 2014-10-25: qty 1

## 2014-10-25 MED ORDER — PROMETHAZINE HCL 25 MG PO TABS: 25.0000 mg | ORAL_TABLET | Freq: Four times a day (QID) | ORAL | Status: DC | PRN

## 2014-10-25 MED ORDER — KETOROLAC TROMETHAMINE 15 MG/ML IJ SOLN
15.0000 mg | Freq: Once | INTRAMUSCULAR | Status: AC
  Administered 2014-10-25: 15 mg via INTRAVENOUS
  Filled 2014-10-25: qty 1

## 2014-10-25 MED ORDER — SODIUM CHLORIDE 0.9 % IV BOLUS (SEPSIS)
1000.0000 mL | Freq: Once | INTRAVENOUS | Status: AC
Start: 2014-10-25 — End: 2014-10-25
  Administered 2014-10-25: 1000 mL via INTRAVENOUS

## 2014-10-25 NOTE — ED Notes (Signed)
Bed: WA22 Expected date:  Expected time:  Means of arrival:  Comments: 

## 2014-10-25 NOTE — ED Notes (Signed)
Pt c/o right upper quadrant abd pain that started last night  Pt states this morning she has constant nausea and vomiting  Pt is pale, skin warm and dry  Pt has dry heaves in triage

## 2014-10-25 NOTE — ED Notes (Signed)
Pt states that she does not want to get up to the bathroom to give sample until she has some nausea medication.  Pt refused the Zofran that RN can initiate protocol order for.  Pt states that Zofran does not work for her.

## 2014-10-25 NOTE — Discharge Instructions (Signed)

## 2014-10-25 NOTE — ED Notes (Signed)
O2 via [place] DC'd

## 2014-10-25 NOTE — ED Notes (Signed)
Pt called out about chest pain and ShOB that began about 1245. Rates pain in chest 8 of 10 and describes as pressure.  Pt connected to 10 lead and EKG captured.

## 2014-10-25 NOTE — ED Notes (Signed)
Pt O2 sat 88-92 on RA.  No c/o ShOB.  Began O2 at 2L via [place].

## 2014-10-25 NOTE — ED Notes (Signed)
Pt states Nausea is gone

## 2014-10-25 NOTE — ED Notes (Signed)
Pt tried for 10 mins for UA. Was unsuccessful.

## 2014-11-09 NOTE — ED Provider Notes (Signed)
CSN: [id number]     Arrival date & time 10/25/14  1058 History   First MD Initiated Contact with Patient 10/25/14 1119     Chief Complaint  Patient presents with  . Abdominal Pain  . Emesis   54yF with abdominal pain. Upper abdomen. Crampy. Onset last night. Persistent since. Associated n/v. NBNB emesis. No fever or chills. NO urinary complaints. No CP or SOB. NO diarrhea. No intervention prior to arrival.   (Consider location/radiation/quality/duration/timing/severity/associated sxs/prior Treatment) HPI  Past Medical History  Diagnosis Date  . Fibromyalgia   . Depression   . Thyroid disease   . Migraine   . Skin cancer Had cancer removed in 2000  . Lupus    Past Surgical History  Procedure Laterality Date  . Abdominal hysterectomy    . Appendectomy     History reviewed. No pertinent family history. Social History  Substance Use Topics  . Smoking status: Never Smoker   . Smokeless tobacco: Never Used  . Alcohol Use: No   OB History    No data available     Review of Systems  All systems reviewed and negative, other than as noted in HPI.   Allergies  Erythromycin; Morphine and related; Penicillins; and Promethazine  Home Medications   Prior to Admission medications   Medication Sig Start Date End Date Taking? Authorizing Provider  busPIRone (BUSPAR) 15 MG tablet Take 15 tablets by mouth 2 (two) times daily. 08/04/14  Yes Historical Provider, MD  diphenhydrAMINE (SOMINEX) 25 MG tablet Take 25 mg by mouth daily as needed for allergies or sleep.   Yes Historical Provider, MD  FLUoxetine (PROZAC) 40 MG capsule Take 40 mg by mouth daily.   Yes Historical Provider, MD  levothyroxine (SYNTHROID, LEVOTHROID) 88 MCG tablet Take 88 mcg by mouth daily.   Yes Historical Provider, MD  LORazepam (ATIVAN) 0.5 MG tablet Take 0.5 mg by mouth 2 (two) times daily as needed. anxiety 07/09/14  Yes Historical Provider, MD  MINIVELLE 0.1 MG/24HR patch Place 1 patch onto the skin every 3  (three) days. But pt states she usually lets it on for 4 days 07/05/14  Yes Historical Provider, MD  Omeprazole Magnesium (PRILOSEC) 10 MG PACK Take 20 mg by mouth daily. 08/16/14  Yes Jeffrey Hedges, PA-C  ondansetron (ZOFRAN) 4 MG tablet Take 1 tablet (4 mg total) by mouth every 8 (eight) hours as needed for nausea or vomiting. 08/16/14  Yes Jeffrey Hedges, PA-C  SOOLANTRA 1 % CREA Apply 1 application topically daily. 09/16/14  Yes Historical Provider, MD  tizanidine (ZANAFLEX) 6 MG capsule Take 6 mg by mouth 2 (two) times daily.   Yes Historical Provider, MD  zolpidem (AMBIEN CR) 12.5 MG CR tablet Take 12.5 mg by mouth at bedtime as needed for sleep.    Yes Historical Provider, MD  oxyCODONE-acetaminophen (PERCOCET/ROXICET) 5-325 MG per tablet Take 1-2 tablets by mouth every 4 (four) hours as needed for severe pain. 10/25/14   Virgel Manifold, MD  promethazine (PHENERGAN) 25 MG tablet Take 1 tablet (25 mg total) by mouth every 6 (six) hours as needed for nausea or vomiting. 10/25/14   Virgel Manifold, MD   BP 129/76 mmHg  Pulse 81  Temp(Src) 97.6 F (36.4 C) (Oral)  Resp 20  Ht 5\' 8"  (1.727 m)  Wt 190 lb (86.183 kg)  BMI 28.90 kg/m2  SpO2 95% Physical Exam  Constitutional: She appears well-developed and well-nourished. No distress.  HENT:  Head: Normocephalic and atraumatic.  Eyes: Conjunctivae are  normal. Right eye exhibits no discharge. Left eye exhibits no discharge.  Neck: Neck supple.  Cardiovascular: Normal rate, regular rhythm and normal heart sounds.  Exam reveals no gallop and no friction rub.   No murmur heard. Pulmonary/Chest: Effort normal and breath sounds normal. No respiratory distress.  Abdominal: Soft. She exhibits no distension. There is tenderness.  Minimal epigastric tenderness w/o rebound or guarding  Musculoskeletal: She exhibits no edema or tenderness.  Lower extremities symmetric as compared to each other. No calf tenderness. Negative Homan's. No palpable cords.    Neurological: She is alert.  Skin: Skin is warm and dry.  Psychiatric: She has a normal mood and affect. Her behavior is normal. Thought content normal.  Nursing note and vitals reviewed.   ED Course  Procedures (including critical care time) Labs Review Labs Reviewed  LIPASE, BLOOD - Abnormal; Notable for the following:    Lipase 20 (*)    All other components within normal limits  COMPREHENSIVE METABOLIC PANEL - Abnormal; Notable for the following:    Glucose, Bld 189 (*)    Creatinine, Ser 1.04 (*)    ALT 11 (*)    GFR calc non Af Amer 59 (*)    All other components within normal limits  CBC - Abnormal; Notable for the following:    WBC 11.8 (*)    All other components within normal limits    Imaging Review No results found. I have personally reviewed and evaluated these images and lab results as part of my medical decision-making.   EKG Interpretation   Date/Time:  Monday October 25 2014 12:50:18 EDT Ventricular Rate:  74 PR Interval:  212 QRS Duration: 100 QT Interval:  473 QTC Calculation: 525 R Axis:   -8 Text Interpretation:  Sinus rhythm Prolonged PR interval Low voltage,  precordial leads RSR' in V1 or V2, right VCD or RVH Borderline T  abnormalities, anterior leads Prolonged QT interval ED PHYSICIAN  INTERPRETATION AVAILABLE IN [facility] Confirmed by TEST, Record  ([id number]) on 10/26/2014 7:53:51 AM      MDM   Final diagnoses:  Chest wall pain        Virgel Manifold, MD 11/09/14 [phone]

## 2015-03-01 ENCOUNTER — Emergency Department (HOSPITAL_COMMUNITY)
Admission: EM | Admit: 2015-03-01 | Discharge: 2015-03-02 | Disposition: A | Discharge status: Home / Self Care | Payer: 59 | Attending: Emergency Medicine | Admitting: Emergency Medicine

## 2015-03-01 ENCOUNTER — Emergency Department (HOSPITAL_COMMUNITY): Payer: 59

## 2015-03-01 ENCOUNTER — Encounter (HOSPITAL_COMMUNITY): Payer: Self-pay | Admitting: Emergency Medicine

## 2015-03-01 DIAGNOSIS — M797 Fibromyalgia: Secondary | ICD-10-CM | POA: Diagnosis not present

## 2015-03-01 DIAGNOSIS — Z85828 Personal history of other malignant neoplasm of skin: Secondary | ICD-10-CM | POA: Diagnosis not present

## 2015-03-01 DIAGNOSIS — M549 Dorsalgia, unspecified: Secondary | ICD-10-CM | POA: Diagnosis not present

## 2015-03-01 DIAGNOSIS — R112 Nausea with vomiting, unspecified: Secondary | ICD-10-CM | POA: Diagnosis not present

## 2015-03-01 DIAGNOSIS — R0789 Other chest pain: Secondary | ICD-10-CM | POA: Insufficient documentation

## 2015-03-01 DIAGNOSIS — R0602 Shortness of breath: Secondary | ICD-10-CM | POA: Insufficient documentation

## 2015-03-01 DIAGNOSIS — E079 Disorder of thyroid, unspecified: Secondary | ICD-10-CM | POA: Insufficient documentation

## 2015-03-01 DIAGNOSIS — R079 Chest pain, unspecified: Secondary | ICD-10-CM | POA: Diagnosis present

## 2015-03-01 DIAGNOSIS — Z79899 Other long term (current) drug therapy: Secondary | ICD-10-CM | POA: Diagnosis not present

## 2015-03-01 DIAGNOSIS — F329 Major depressive disorder, single episode, unspecified: Secondary | ICD-10-CM | POA: Insufficient documentation

## 2015-03-01 DIAGNOSIS — Z880 Allergy status to penicillin: Secondary | ICD-10-CM | POA: Diagnosis not present

## 2015-03-01 DIAGNOSIS — Z8679 Personal history of other diseases of the circulatory system: Secondary | ICD-10-CM | POA: Insufficient documentation

## 2015-03-01 DIAGNOSIS — R0781 Pleurodynia: Secondary | ICD-10-CM

## 2015-03-01 LAB — D-DIMER, QUANTITATIVE (NOT AT ARMC): D DIMER QUANT: 1.75 ug{FEU}/mL — AB (ref 0.00–0.50)

## 2015-03-01 LAB — CBC
HCT: 44.4 % (ref 36.0–46.0)
Hemoglobin: 14.9 g/dL (ref 12.0–15.0)
MCH: 28.7 pg (ref 26.0–34.0)
MCHC: 33.6 g/dL (ref 30.0–36.0)
MCV: 85.5 fL (ref 78.0–100.0)
PLATELETS: 268 10*3/uL (ref 150–400)
RBC: 5.19 MIL/uL — AB (ref 3.87–5.11)
RDW: 15.2 % (ref 11.5–15.5)
WBC: 8.3 10*3/uL (ref 4.0–10.5)

## 2015-03-01 LAB — BASIC METABOLIC PANEL
Anion gap: 13 (ref 5–15)
BUN: 10 mg/dL (ref 6–20)
CALCIUM: 9.7 mg/dL (ref 8.9–10.3)
CO2: 24 mmol/L (ref 22–32)
CREATININE: 1.19 mg/dL — AB (ref 0.44–1.00)
Chloride: 103 mmol/L (ref 101–111)
GFR calc non Af Amer: 50 mL/min — ABNORMAL LOW (ref >60)
GFR, EST AFRICAN AMERICAN: 58 mL/min — AB (ref >60)
Glucose, Bld: 144 mg/dL — ABNORMAL HIGH (ref 65–99)
Potassium: 4 mmol/L (ref 3.5–5.1)
SODIUM: 140 mmol/L (ref 135–145)

## 2015-03-01 LAB — I-STAT TROPONIN, ED: TROPONIN I, POC: 0 ng/mL (ref 0.00–0.08)

## 2015-03-01 LAB — LIPASE, BLOOD: Lipase: 25 U/L (ref 11–51)

## 2015-03-01 IMAGING — CR DG CHEST 2V
2 series · 2 of 2 positions shown · non-contrast
Comparison: Chest x-ray [DATE].

CLINICAL DATA: 54-year-old female with history of shortness of
breath for 1 week. Left-sided rib pain and mid chest pain for the
past 2 days.

EXAM:
CHEST  2 VIEW

[w chest pa]
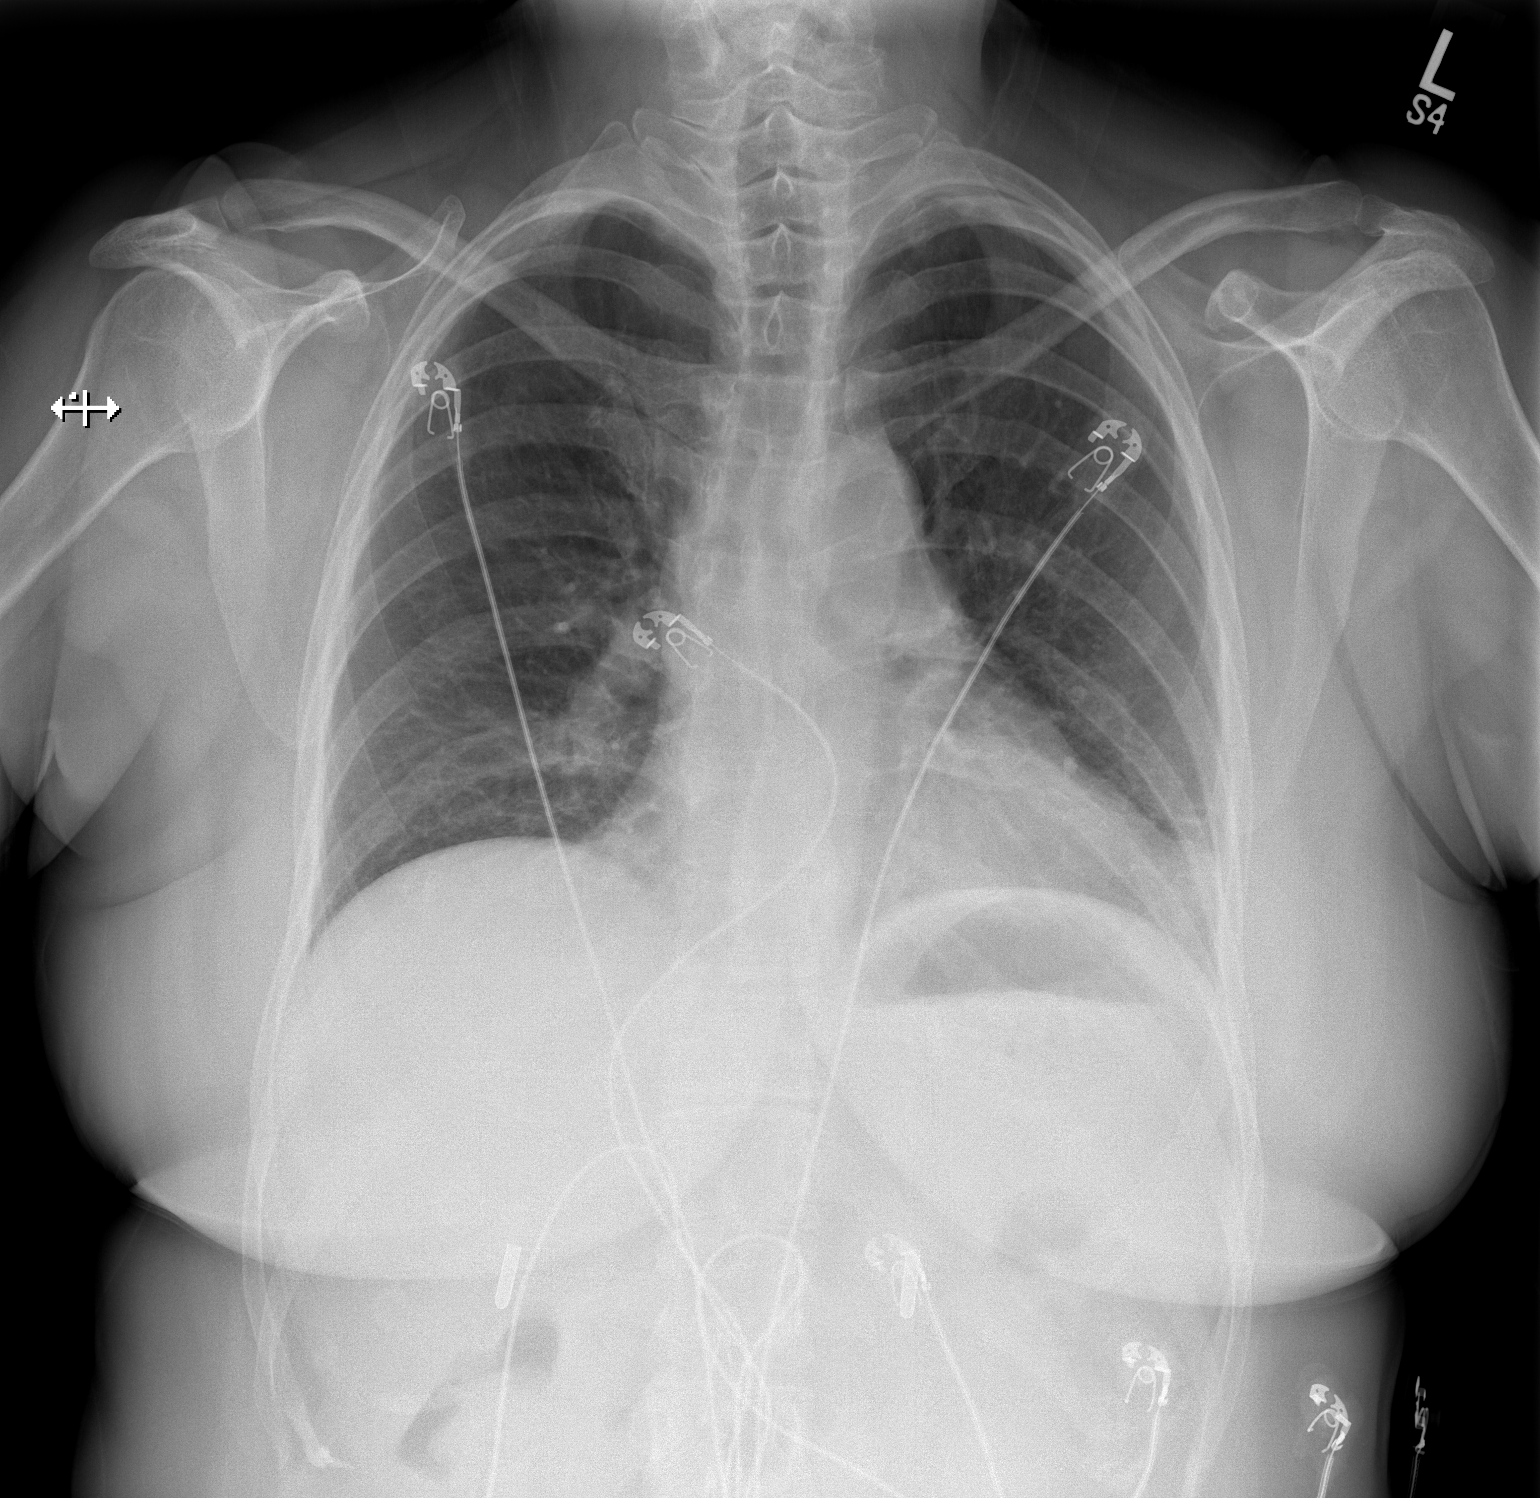

[w chest lat]
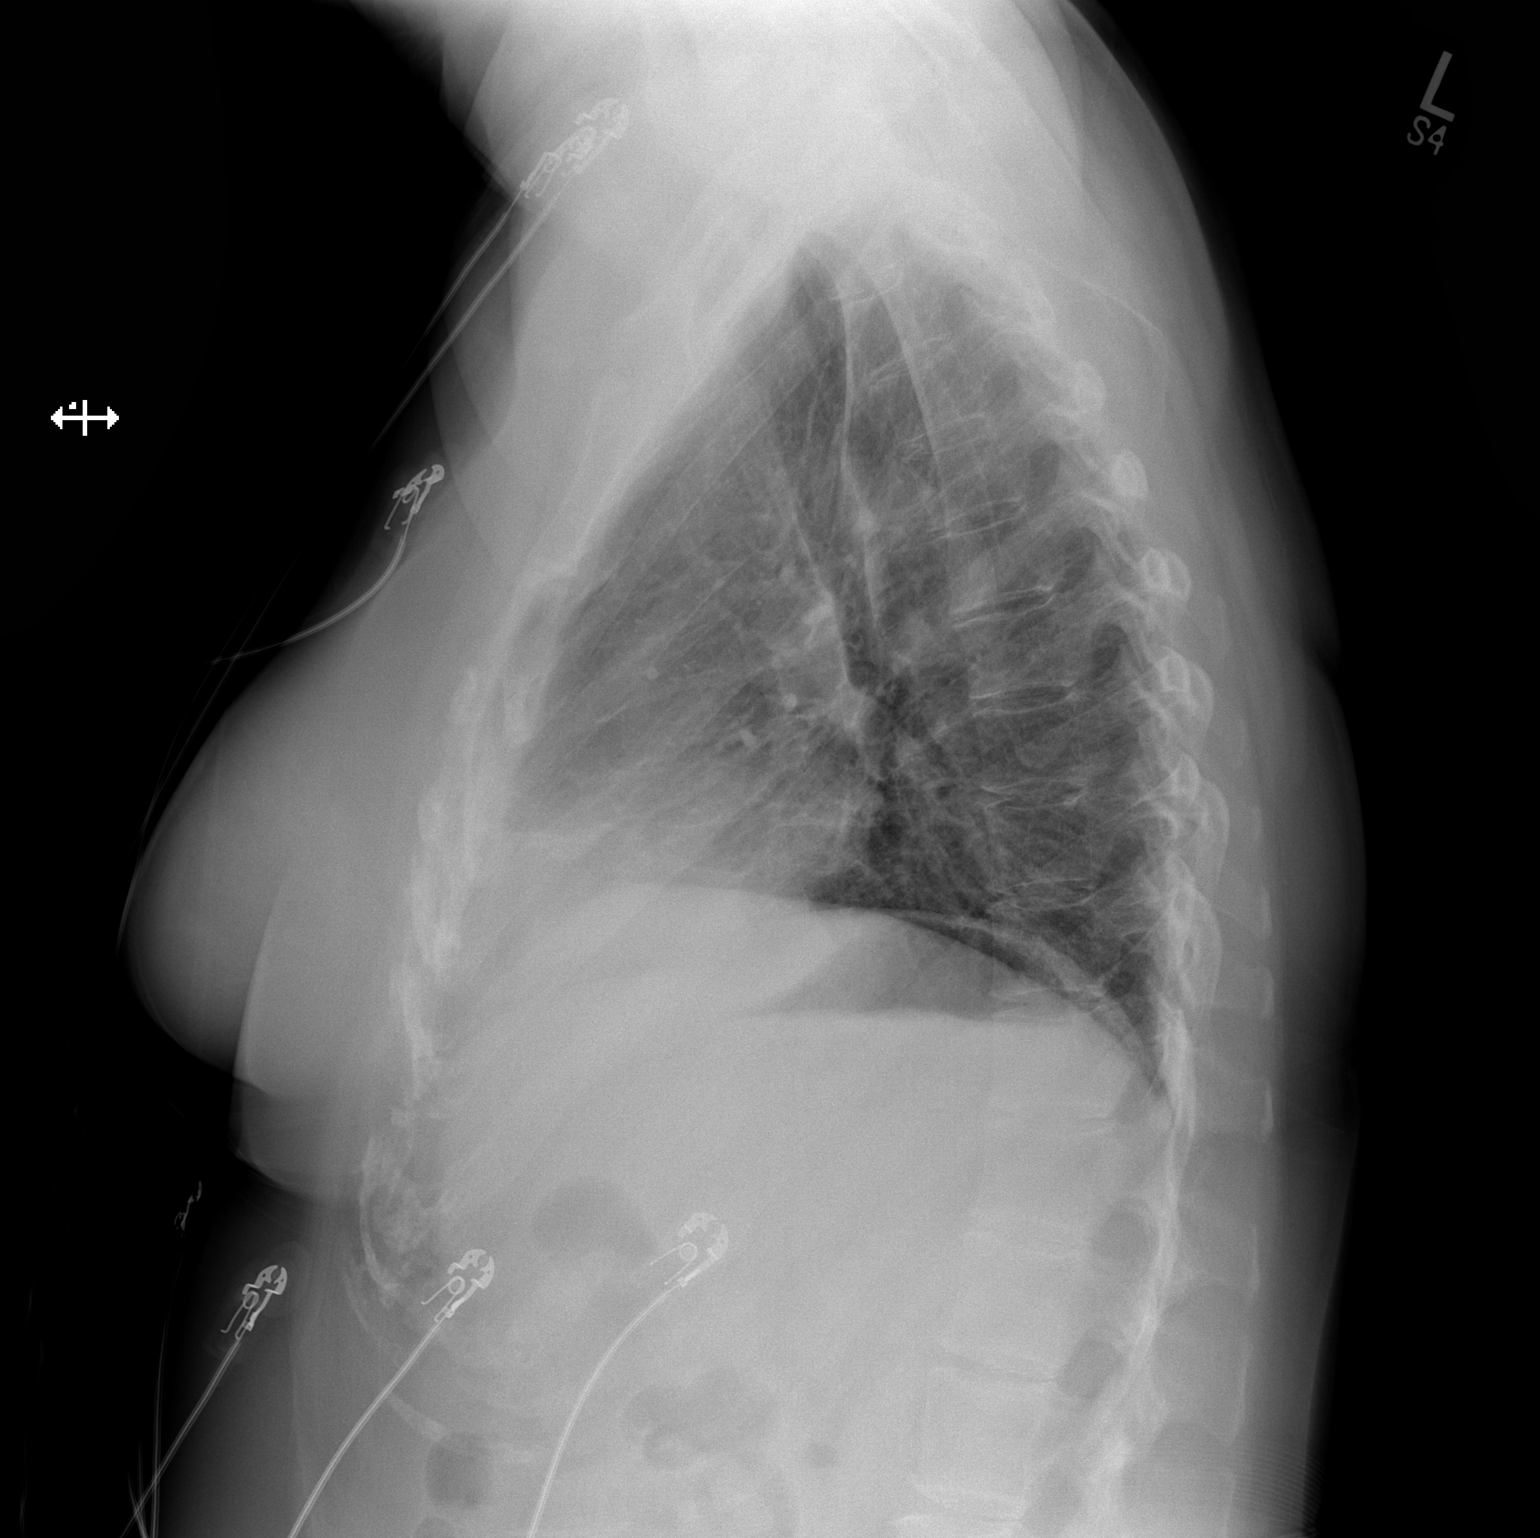

[2 of 2 positions shown; findings below may reference images not displayed]

FINDINGS: Lung volumes are low. No consolidative airspace disease. No pleural
effusions. No pneumothorax. No pulmonary nodule or mass noted.
Pulmonary vasculature and the cardiomediastinal silhouette are
within normal limits. Atherosclerosis in the thoracic aorta.
IMPRESSION: 1. Low lung volumes without radiographic evidence of acute
cardiopulmonary disease.
2. Atherosclerosis.

## 2015-03-01 MED ORDER — SODIUM CHLORIDE 0.9 % IV BOLUS (SEPSIS)
1000.0000 mL | Freq: Once | INTRAVENOUS | Status: AC
  Administered 2015-03-01: 1000 mL via INTRAVENOUS

## 2015-03-01 MED ORDER — HYDROMORPHONE HCL 1 MG/ML IJ SOLN
0.5000 mg | Freq: Once | INTRAMUSCULAR | Status: AC
  Administered 2015-03-01: 0.5 mg via INTRAVENOUS
  Filled 2015-03-01: qty 1

## 2015-03-01 MED ORDER — ONDANSETRON HCL 4 MG/2ML IJ SOLN
4.0000 mg | Freq: Once | INTRAMUSCULAR | Status: AC
  Administered 2015-03-01: 4 mg via INTRAVENOUS
  Filled 2015-03-01: qty 2

## 2015-03-01 NOTE — ED Notes (Signed)
Pt reports bilateral lower CP since yesterday am. Pain radiates down back. Also having SOB and dizziness.

## 2015-03-01 NOTE — ED Provider Notes (Signed)
CSN: [id number]     Arrival date & time 03/01/15  1841 History   First MD Initiated Contact with Patient 03/01/15 2212     Chief Complaint  Patient presents with  . Chest Pain     (Consider location/radiation/quality/duration/timing/severity/associated sxs/prior Treatment) Patient is a 55 y.o. female presenting with chest pain. The history is provided by the patient. No language interpreter was used.  Chest Pain Pain location:  L lateral chest and R lateral chest Pain quality: radiating   Pain radiates to:  Mid back Onset quality:  Gradual Duration:  2 days Timing:  Constant Progression:  Worsening Chronicity:  New Context: breathing   Associated symptoms: back pain, fatigue, nausea, shortness of breath and vomiting     Past Medical History  Diagnosis Date  . Fibromyalgia   . Depression   . Thyroid disease   . Migraine   . Skin cancer Had cancer removed in 2000  . Lupus [facility])    Past Surgical History  Procedure Laterality Date  . Abdominal hysterectomy    . Appendectomy     History reviewed. No pertinent family history. Social History  Substance Use Topics  . Smoking status: Never Smoker   . Smokeless tobacco: Never Used  . Alcohol Use: No   OB History    No data available     Review of Systems  Constitutional: Positive for fatigue.  Respiratory: Positive for shortness of breath.   Cardiovascular: Positive for chest pain.  Gastrointestinal: Positive for nausea and vomiting.  Musculoskeletal: Positive for myalgias, back pain and arthralgias.  All other systems reviewed and are negative.     Allergies  Erythromycin; Morphine and related; Penicillins; and Promethazine  Home Medications   Prior to Admission medications   Medication Sig Start Date End Date Taking? Authorizing Provider  busPIRone (BUSPAR) 15 MG tablet Take 15 tablets by mouth 2 (two) times daily. 08/04/14   Historical Provider, MD  diphenhydrAMINE (SOMINEX) 25 MG tablet Take 25 mg by mouth  daily as needed for allergies or sleep.    Historical Provider, MD  FLUoxetine (PROZAC) 40 MG capsule Take 40 mg by mouth daily.    Historical Provider, MD  levothyroxine (SYNTHROID, LEVOTHROID) 88 MCG tablet Take 88 mcg by mouth daily.    Historical Provider, MD  LORazepam (ATIVAN) 0.5 MG tablet Take 0.5 mg by mouth 2 (two) times daily as needed. anxiety 07/09/14   Historical Provider, MD  MINIVELLE 0.1 MG/24HR patch Place 1 patch onto the skin every 3 (three) days. But pt states she usually lets it on for 4 days 07/05/14   Historical Provider, MD  Omeprazole Magnesium (PRILOSEC) 10 MG PACK Take 20 mg by mouth daily. 08/16/14   Dellis Filbert Hedges, PA-C  ondansetron (ZOFRAN) 4 MG tablet Take 1 tablet (4 mg total) by mouth every 8 (eight) hours as needed for nausea or vomiting. 08/16/14   Okey Regal, PA-C  oxyCODONE-acetaminophen (PERCOCET/ROXICET) 5-325 MG per tablet Take 1-2 tablets by mouth every 4 (four) hours as needed for severe pain. 10/25/14   Virgel Manifold, MD  promethazine (PHENERGAN) 25 MG tablet Take 1 tablet (25 mg total) by mouth every 6 (six) hours as needed for nausea or vomiting. 10/25/14   Virgel Manifold, MD  SOOLANTRA 1 % CREA Apply 1 application topically daily. 09/16/14   Historical Provider, MD  tizanidine (ZANAFLEX) 6 MG capsule Take 6 mg by mouth 2 (two) times daily.    Historical Provider, MD  zolpidem (AMBIEN CR) 12.5 MG CR tablet  Take 12.5 mg by mouth at bedtime as needed for sleep.     Historical Provider, MD   BP 131/97 mmHg  Pulse 113  Temp(Src) 98.6 F (37 C) (Oral)  Resp 18  SpO2 96% Physical Exam  Constitutional: She is oriented to person, place, and time. She appears well-developed and well-nourished.  HENT:  Head: Normocephalic and atraumatic.  Eyes: Conjunctivae are normal.  Neck: Neck supple.  Cardiovascular: Regular rhythm.   Pulmonary/Chest: Breath sounds normal. She has no wheezes. She exhibits tenderness.  Abdominal: Soft. Bowel sounds are normal.   Musculoskeletal: She exhibits no edema.  Lymphadenopathy:    She has no cervical adenopathy.  Neurological: She is alert and oriented to person, place, and time.  Skin: Skin is warm and dry.  Psychiatric: She has a normal mood and affect.  Nursing note and vitals reviewed.   ED Course  Procedures (including critical care time) Labs Review Labs Reviewed  BASIC METABOLIC PANEL - Abnormal; Notable for the following:    Glucose, Bld 144 (*)    Creatinine, Ser 1.19 (*)    GFR calc non Af Amer 50 (*)    GFR calc Af Amer 58 (*)    All other components within normal limits  CBC - Abnormal; Notable for the following:    RBC 5.19 (*)    All other components within normal limits  D-DIMER, QUANTITATIVE (NOT AT [facility]) - Abnormal; Notable for the following:    D-Dimer, Quant 1.75 (*)    All other components within normal limits  LIPASE, BLOOD  I-STAT TROPOININ, ED    Imaging Review Dg Chest 2 View  03/01/2015  CLINICAL DATA:  55 year old female with history of shortness of breath for 1 week. Left-sided rib pain and mid chest pain for the past 2 days. EXAM: CHEST  2 VIEW COMPARISON:  Chest x-ray 10/25/2014. FINDINGS: Lung volumes are low. No consolidative airspace disease. No pleural effusions. No pneumothorax. No pulmonary nodule or mass noted. Pulmonary vasculature and the cardiomediastinal silhouette are within normal limits. Atherosclerosis in the thoracic aorta. IMPRESSION: 1. Low lung volumes without radiographic evidence of acute cardiopulmonary disease. 2. Atherosclerosis. Electronically Signed   By: Vinnie Langton M.D.   On: 03/01/2015 19:36   Ct Angio Chest Pe W/cm &/or Wo Cm  03/02/2015  CLINICAL DATA:  55 year old female with pleuritic chest pain and tachycardia EXAM: CT ANGIOGRAPHY CHEST WITH CONTRAST TECHNIQUE: Multidetector CT imaging of the chest was performed using the standard protocol during bolus administration of intravenous contrast. Multiplanar CT image reconstructions  and MIPs were obtained to evaluate the vascular anatomy. CONTRAST:  172mL OMNIPAQUE IOHEXOL 350 MG/ML SOLN COMPARISON:  Radiograph dated 03/01/2015 FINDINGS: Subsegmental Linear atelectasis/ scarring of the left lung base. Small left lower lobe subpleural calcified nodules. There is no focal consolidation, pleural effusion, or pneumothorax. The central airways are patent. The thoracic aorta appears unremarkable. There is no CT evidence of pulmonary embolism. There is no hilar or mediastinal adenopathy. Mild cardiomegaly. No pericardial effusion. The esophagus is grossly unremarkable. The thyroid is not well visualized. There is no axillary adenopathy. The chest wall soft tissues appear unremarkable. There is mild degenerative changes of the spine. No acute fracture. Review of the MIP images confirms the above findings. IMPRESSION: No CT evidence of pulmonary embolism. Electronically Signed   By: Anner Crete M.D.   On: 03/02/2015 01:42   I have personally reviewed and evaluated these images and lab results as part of my medical decision-making.   EKG  Interpretation   Date/Time:  Tuesday March 01 2015 19:12:02 EST Ventricular Rate:  104 PR Interval:  160 QRS Duration: 98 QT Interval:  344 QTC Calculation: 452 R Axis:   -33 Text Interpretation:  Sinus tachycardia Left axis deviation Low voltage,  precordial leads RSR' in V1 or V2, right VCD or RVH Borderline T  abnormalities, anterior leads No significant change since last tracing  Confirmed by FLOYD MD, Quillian Quince [id number]) on 03/01/2015 10:37:01 PM     Patient with pleuritic chest pain and tachycardia. Normal troponin. No ischemic changes on ECG. Elevated d-dimer. No pulmonary embolism on CT angio  MDM   Final diagnoses:  None   Pleuritic chest pain. Anti-inflammatory. Care instructions provided. Return precautions discussed. Follow-up with PCP.   Etta Quill, NP 03/02/15 [place], DO 03/02/15 1840

## 2015-03-02 ENCOUNTER — Encounter (HOSPITAL_COMMUNITY): Payer: Self-pay

## 2015-03-02 IMAGING — CT CT ANGIO CHEST
2 of 6 series · 19 of 36 positions shown · IV contrast ([ID] OMNI 350)
Comparison: Radiograph dated [DATE]

CLINICAL DATA: 54-year-old female with pleuritic chest pain and
tachycardia

EXAM:
CT ANGIOGRAPHY CHEST WITH CONTRAST
TECHNIQUE: Multidetector CT imaging of the chest was performed using the
standard protocol during bolus administration of intravenous
contrast. Multiplanar CT image reconstructions and MIPs were
obtained to evaluate the vascular anatomy.
CONTRAST:  100mL OMNIPAQUE IOHEXOL 350 MG/ML SOLN

[Series 6: thins for pacs · axial · 0.57mm/px · z∈[-286,-66]mm · 18 of 246 slices shown]
[im 13/246  lung]
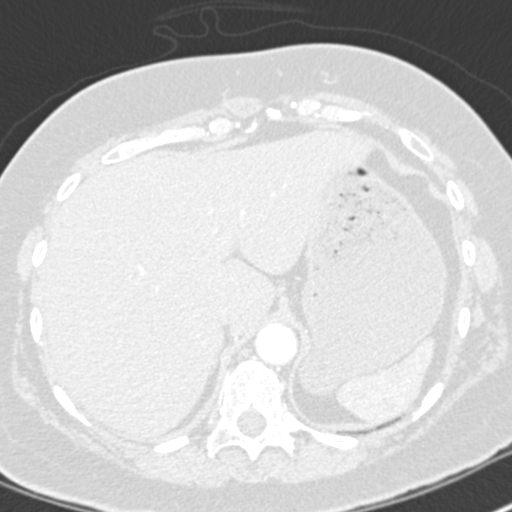
[im 25/246  mediastinal]
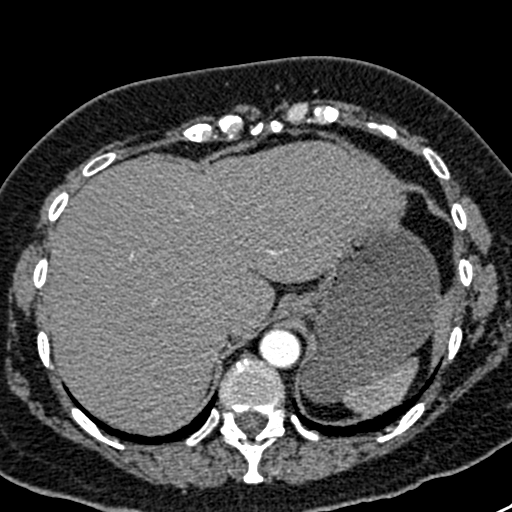
[im 37/246  lung]
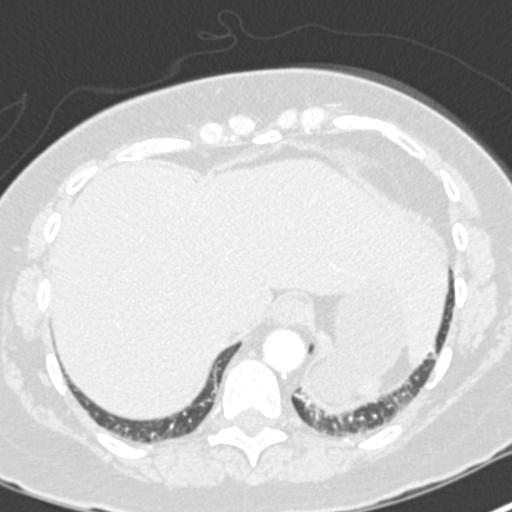
[im 50/246  mediastinal]
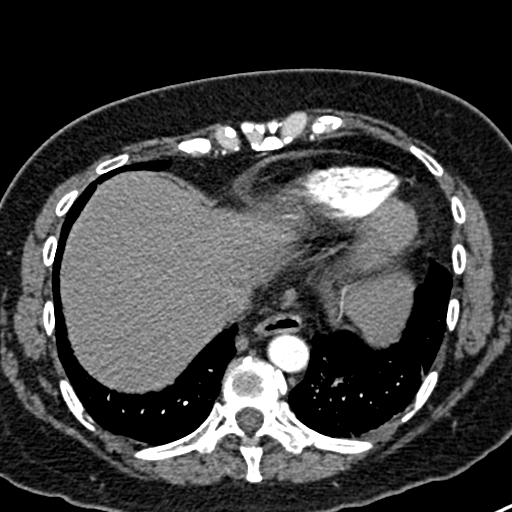
[im 62/246  lung]
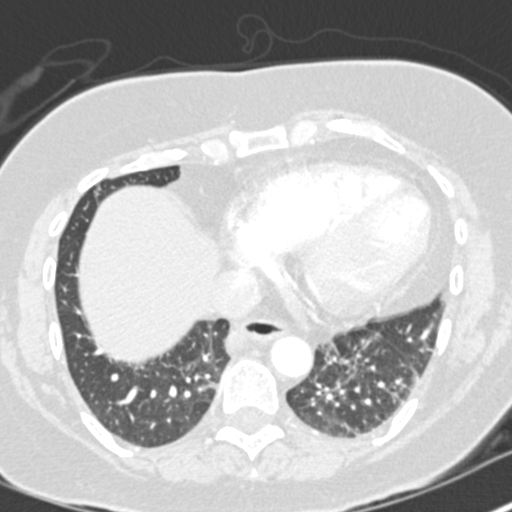
[im 74/246  mediastinal]
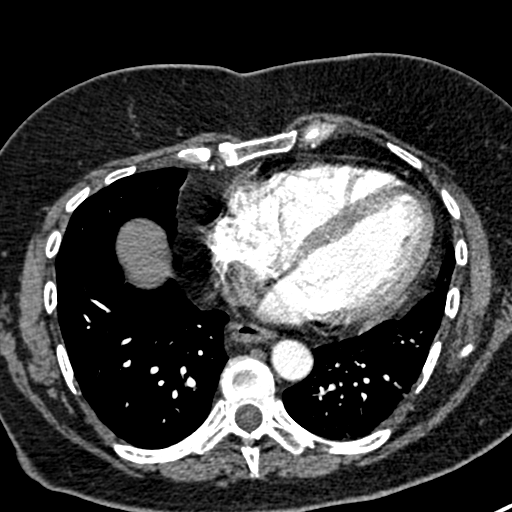
[im 86/246  lung]
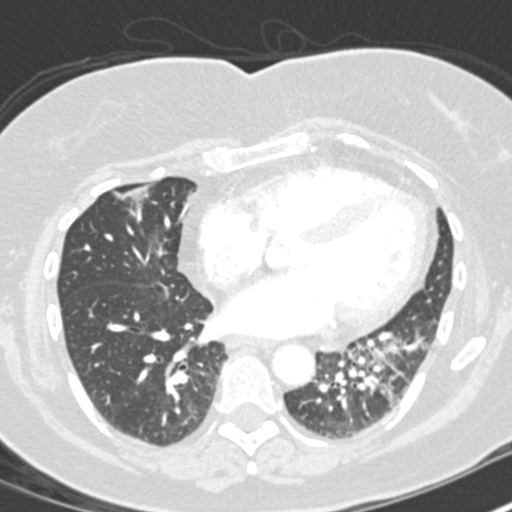
[im 99/246  mediastinal]
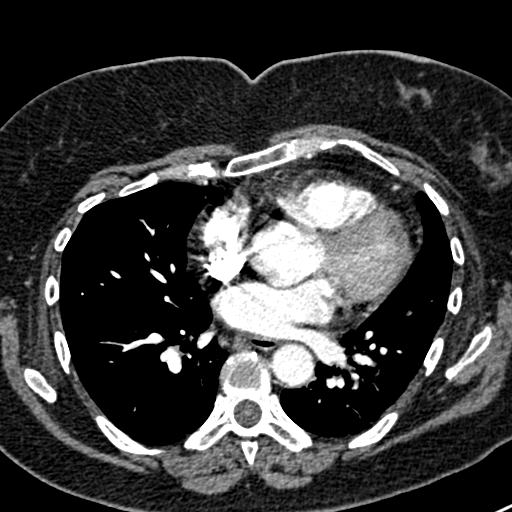
[im 111/246  lung]
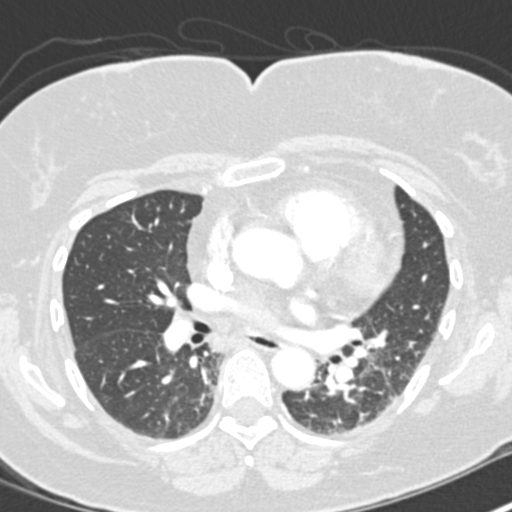
[im 135/246  mediastinal]
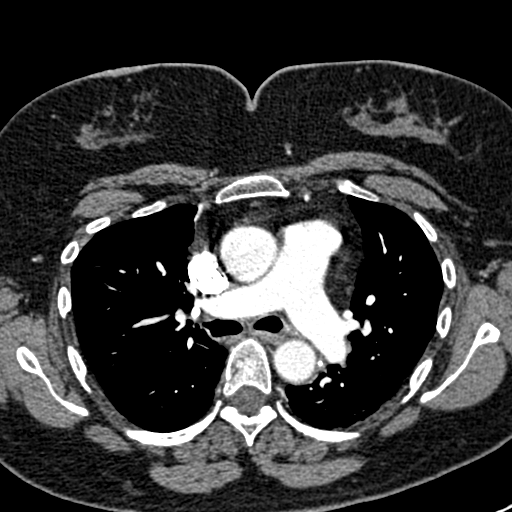
[im 148/246  lung]
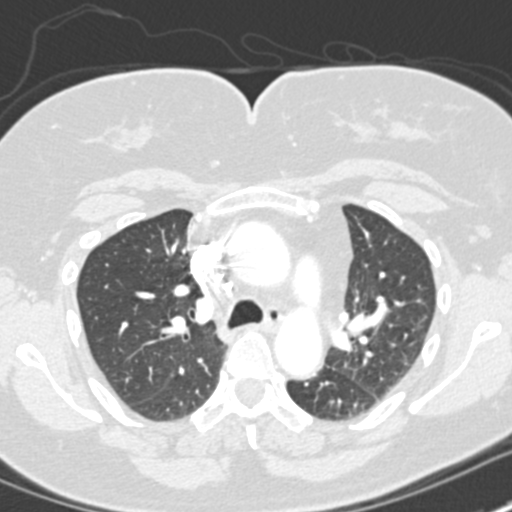
[im 160/246  mediastinal]
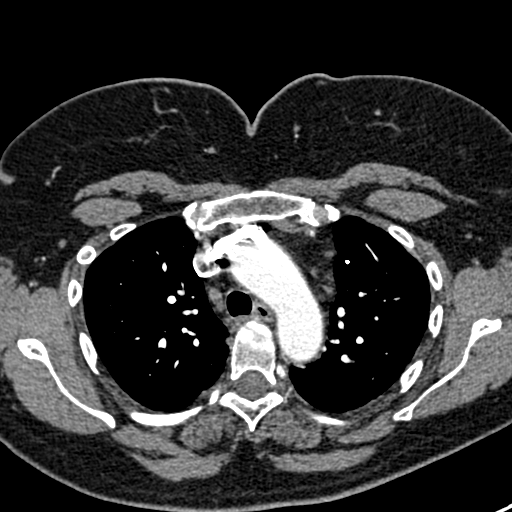
[im 172/246  lung]
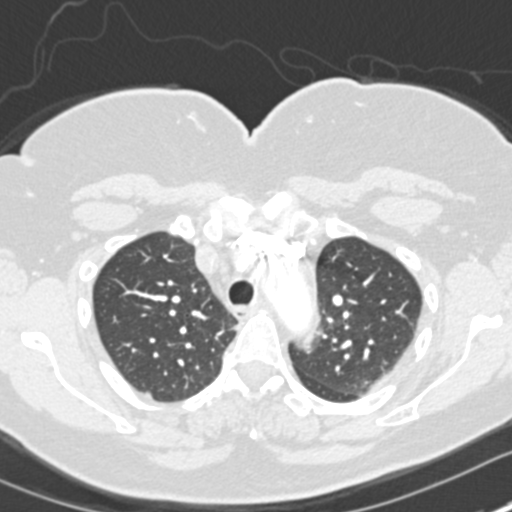
[im 184/246  mediastinal]
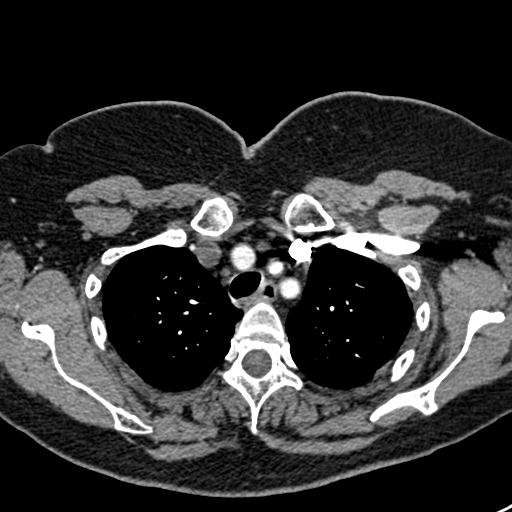
[im 197/246  lung]
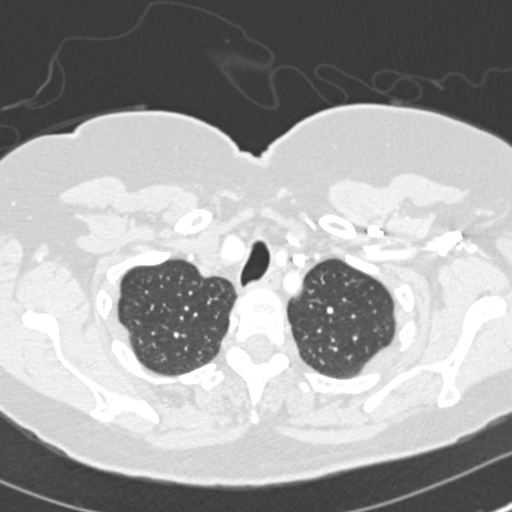
[im 209/246  mediastinal]
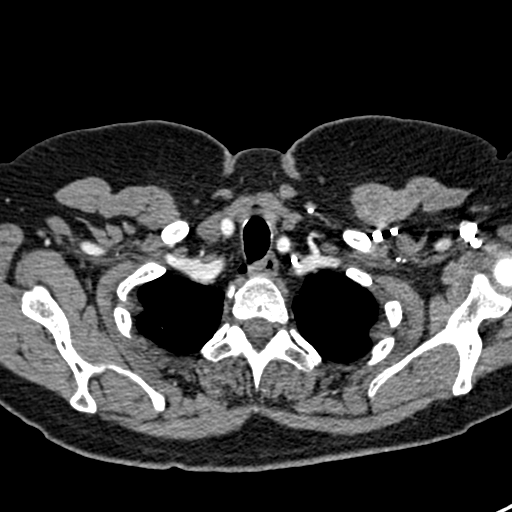
[im 221/246  lung]
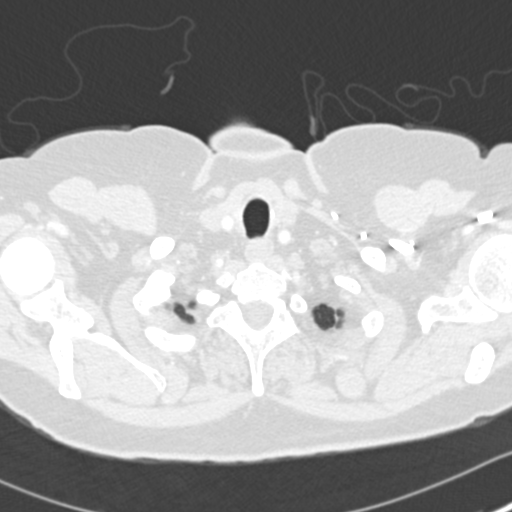
[im 233/246  mediastinal]
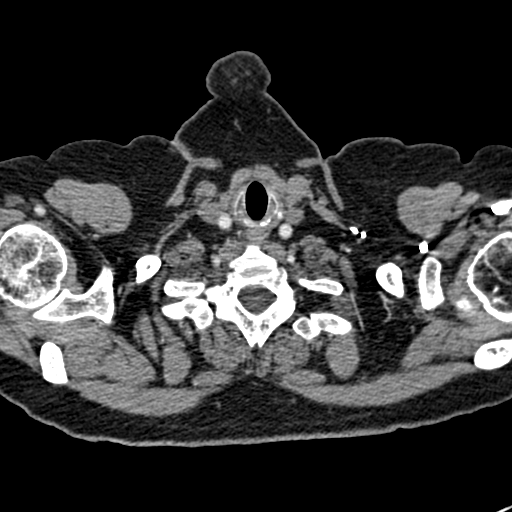

[Series 8: coronal mpr · coronal · 0.48mm/px · 1 of 113 slices shown]
[im 57/113  mediastinal]
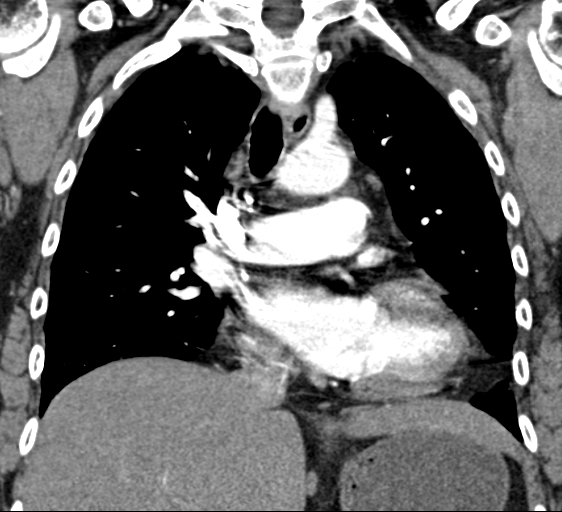

[19 of 36 positions shown; findings below may reference images not displayed]

FINDINGS: Subsegmental Linear atelectasis/ scarring of the left lung base.
Small left lower lobe subpleural calcified nodules. There is no
focal consolidation, pleural effusion, or pneumothorax. The central
airways are patent.

The thoracic aorta appears unremarkable. There is no CT evidence of
pulmonary embolism. There is no hilar or mediastinal adenopathy.
Mild cardiomegaly. No pericardial effusion. The esophagus is grossly
unremarkable. The thyroid is not well visualized. There is no
axillary adenopathy. The chest wall soft tissues appear
unremarkable. There is mild degenerative changes of the spine. No
acute fracture.

Review of the MIP images confirms the above findings.
IMPRESSION: No CT evidence of pulmonary embolism.

## 2015-03-02 MED ORDER — IOHEXOL 350 MG/ML SOLN
100.0000 mL | Freq: Once | INTRAVENOUS | Status: AC | PRN
  Administered 2015-03-02: 100 mL via INTRAVENOUS

## 2015-03-02 MED ORDER — NAPROXEN 500 MG PO TABS: 500.0000 mg | ORAL_TABLET | Freq: Two times a day (BID) | ORAL | Status: DC

## 2015-03-02 NOTE — Discharge Instructions (Signed)

## 2016-03-06 DIAGNOSIS — M545 Low back pain: Secondary | ICD-10-CM | POA: Diagnosis not present

## 2016-03-06 DIAGNOSIS — F5101 Primary insomnia: Secondary | ICD-10-CM | POA: Diagnosis not present

## 2016-05-22 DIAGNOSIS — Z01419 Encounter for gynecological examination (general) (routine) without abnormal findings: Secondary | ICD-10-CM | POA: Diagnosis not present

## 2016-06-08 DIAGNOSIS — E785 Hyperlipidemia, unspecified: Secondary | ICD-10-CM | POA: Diagnosis not present

## 2016-06-08 DIAGNOSIS — Z0000 Encounter for general adult medical examination without abnormal findings: Secondary | ICD-10-CM | POA: Diagnosis not present

## 2016-10-09 DIAGNOSIS — E039 Hypothyroidism, unspecified: Secondary | ICD-10-CM | POA: Diagnosis not present

## 2016-10-09 DIAGNOSIS — N183 Chronic kidney disease, stage 3 (moderate): Secondary | ICD-10-CM | POA: Diagnosis not present

## 2016-10-09 DIAGNOSIS — G4700 Insomnia, unspecified: Secondary | ICD-10-CM | POA: Diagnosis not present

## 2016-12-21 DIAGNOSIS — D485 Neoplasm of uncertain behavior of skin: Secondary | ICD-10-CM | POA: Diagnosis not present

## 2016-12-21 DIAGNOSIS — L570 Actinic keratosis: Secondary | ICD-10-CM | POA: Diagnosis not present

## 2016-12-21 DIAGNOSIS — L218 Other seborrheic dermatitis: Secondary | ICD-10-CM | POA: Diagnosis not present

## 2016-12-21 DIAGNOSIS — D2262 Melanocytic nevi of left upper limb, including shoulder: Secondary | ICD-10-CM | POA: Diagnosis not present

## 2016-12-21 DIAGNOSIS — D692 Other nonthrombocytopenic purpura: Secondary | ICD-10-CM | POA: Diagnosis not present

## 2016-12-21 DIAGNOSIS — D225 Melanocytic nevi of trunk: Secondary | ICD-10-CM | POA: Diagnosis not present

## 2017-01-24 DIAGNOSIS — L0889 Other specified local infections of the skin and subcutaneous tissue: Secondary | ICD-10-CM | POA: Diagnosis not present

## 2017-04-24 DIAGNOSIS — L218 Other seborrheic dermatitis: Secondary | ICD-10-CM | POA: Diagnosis not present

## 2017-04-24 DIAGNOSIS — L821 Other seborrheic keratosis: Secondary | ICD-10-CM | POA: Diagnosis not present

## 2017-04-24 DIAGNOSIS — D170 Benign lipomatous neoplasm of skin and subcutaneous tissue of head, face and neck: Secondary | ICD-10-CM | POA: Diagnosis not present

## 2017-05-23 DIAGNOSIS — H53483 Generalized contraction of visual field, bilateral: Secondary | ICD-10-CM | POA: Diagnosis not present

## 2017-05-23 DIAGNOSIS — H02834 Dermatochalasis of left upper eyelid: Secondary | ICD-10-CM | POA: Diagnosis not present

## 2017-05-23 DIAGNOSIS — H02831 Dermatochalasis of right upper eyelid: Secondary | ICD-10-CM | POA: Diagnosis not present

## 2017-05-23 DIAGNOSIS — H02413 Mechanical ptosis of bilateral eyelids: Secondary | ICD-10-CM | POA: Diagnosis not present

## 2017-06-14 DIAGNOSIS — E785 Hyperlipidemia, unspecified: Secondary | ICD-10-CM | POA: Diagnosis not present

## 2017-06-14 DIAGNOSIS — Z0000 Encounter for general adult medical examination without abnormal findings: Secondary | ICD-10-CM | POA: Diagnosis not present

## 2017-12-12 DIAGNOSIS — L718 Other rosacea: Secondary | ICD-10-CM | POA: Diagnosis not present

## 2017-12-12 DIAGNOSIS — L814 Other melanin hyperpigmentation: Secondary | ICD-10-CM | POA: Diagnosis not present

## 2017-12-12 DIAGNOSIS — L218 Other seborrheic dermatitis: Secondary | ICD-10-CM | POA: Diagnosis not present

## 2017-12-12 DIAGNOSIS — L570 Actinic keratosis: Secondary | ICD-10-CM | POA: Diagnosis not present

## 2018-01-01 ENCOUNTER — Other Ambulatory Visit: Payer: Self-pay | Admitting: Family Medicine

## 2018-01-01 ENCOUNTER — Ambulatory Visit
Admission: RE | Admit: 2018-01-01 | Discharge: 2018-01-01 | Disposition: A | Discharge status: Home / Self Care | Payer: 59 | Source: Ambulatory Visit | Attending: Family Medicine | Admitting: Family Medicine

## 2018-01-01 DIAGNOSIS — R079 Chest pain, unspecified: Secondary | ICD-10-CM

## 2018-01-01 DIAGNOSIS — M940 Chondrocostal junction syndrome [Tietze]: Secondary | ICD-10-CM | POA: Diagnosis not present

## 2018-01-01 IMAGING — DX DG CHEST 2V
2 series · 2 of 2 positions shown · non-contrast
Comparison: None.

CLINICAL DATA: Left mid axillary chest pain and anterior pressure x
1 week with NKI, r/o malignancychest pain

EXAM:
CHEST - 2 VIEW

[dg chest 2 view (1 of 2)]
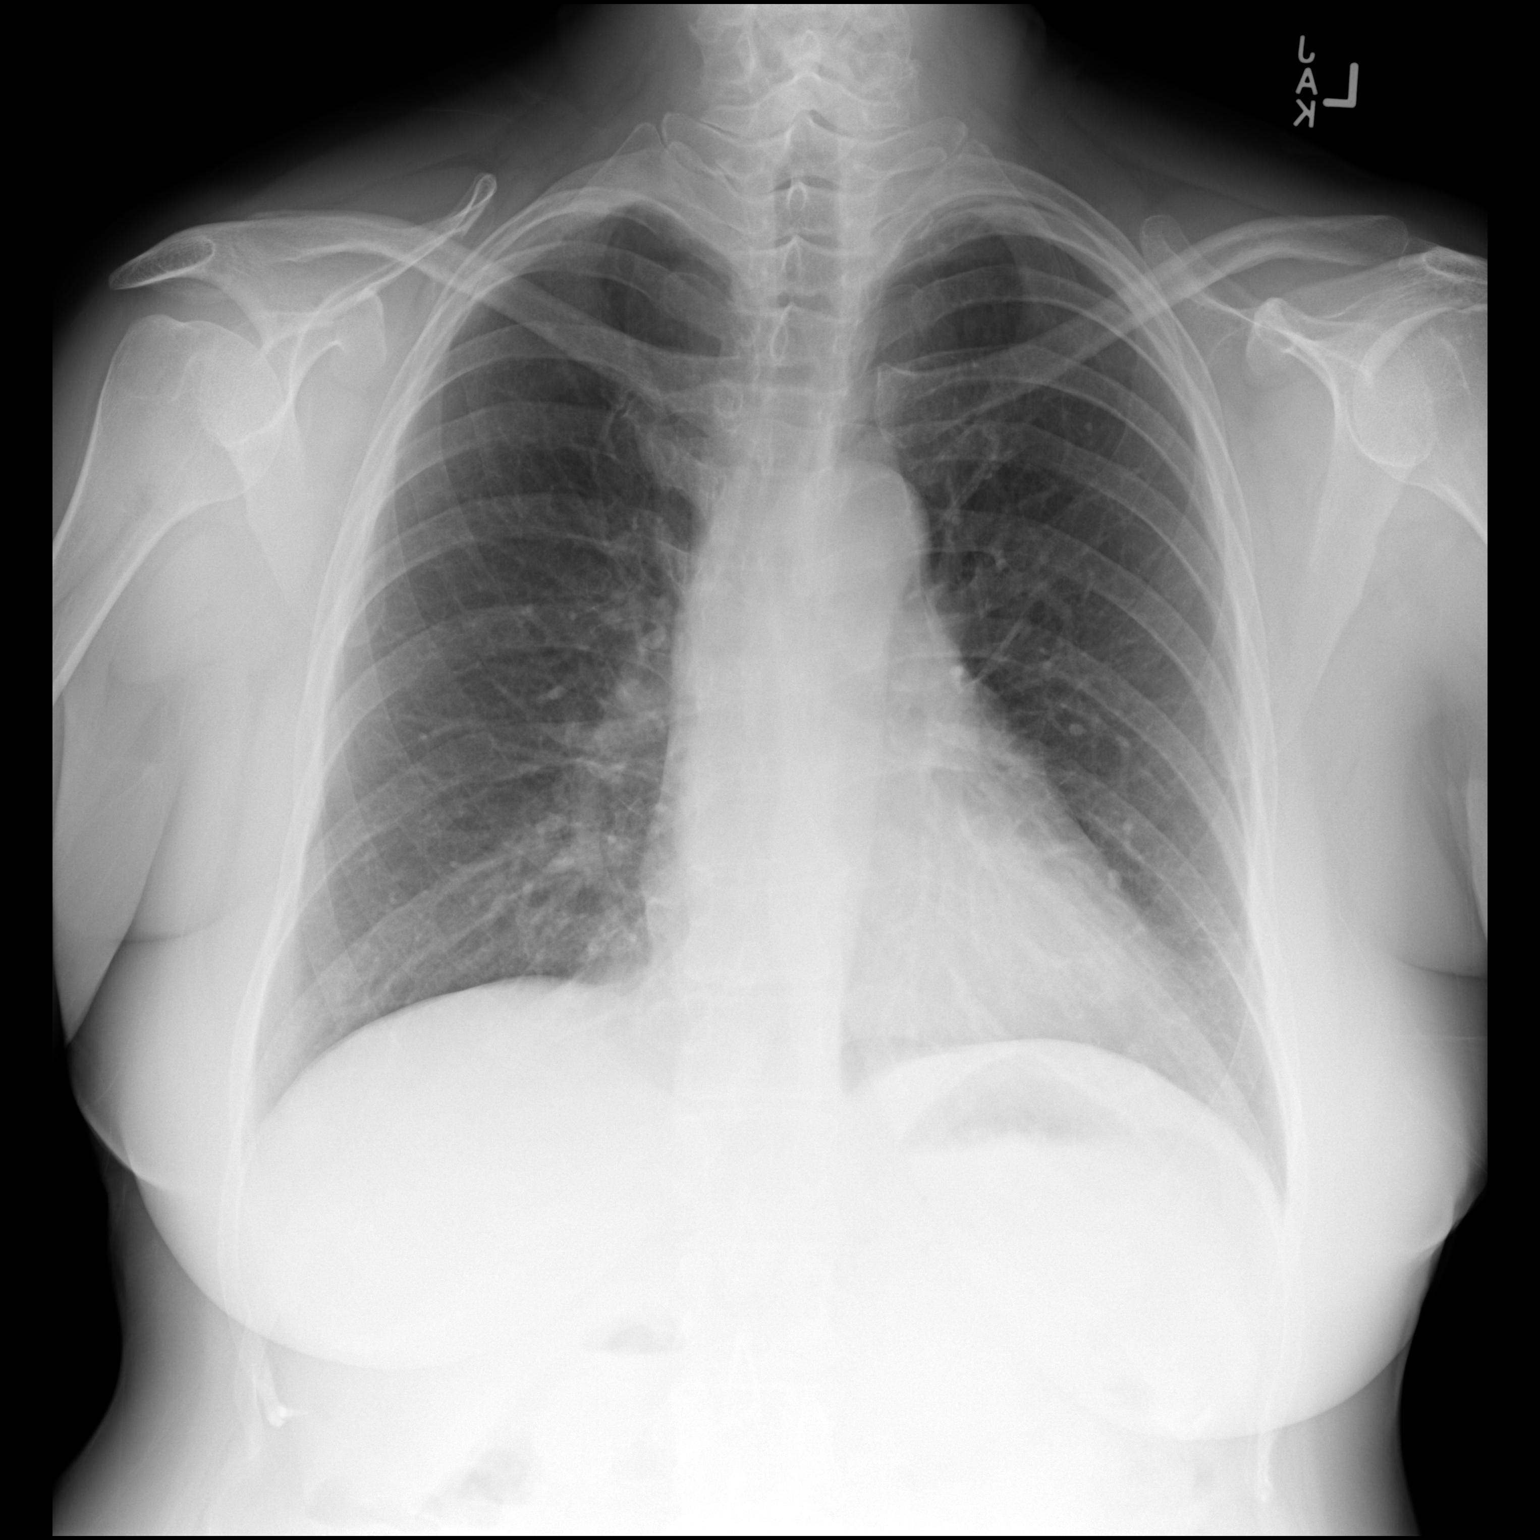

[dg chest 2 view (2 of 2)]
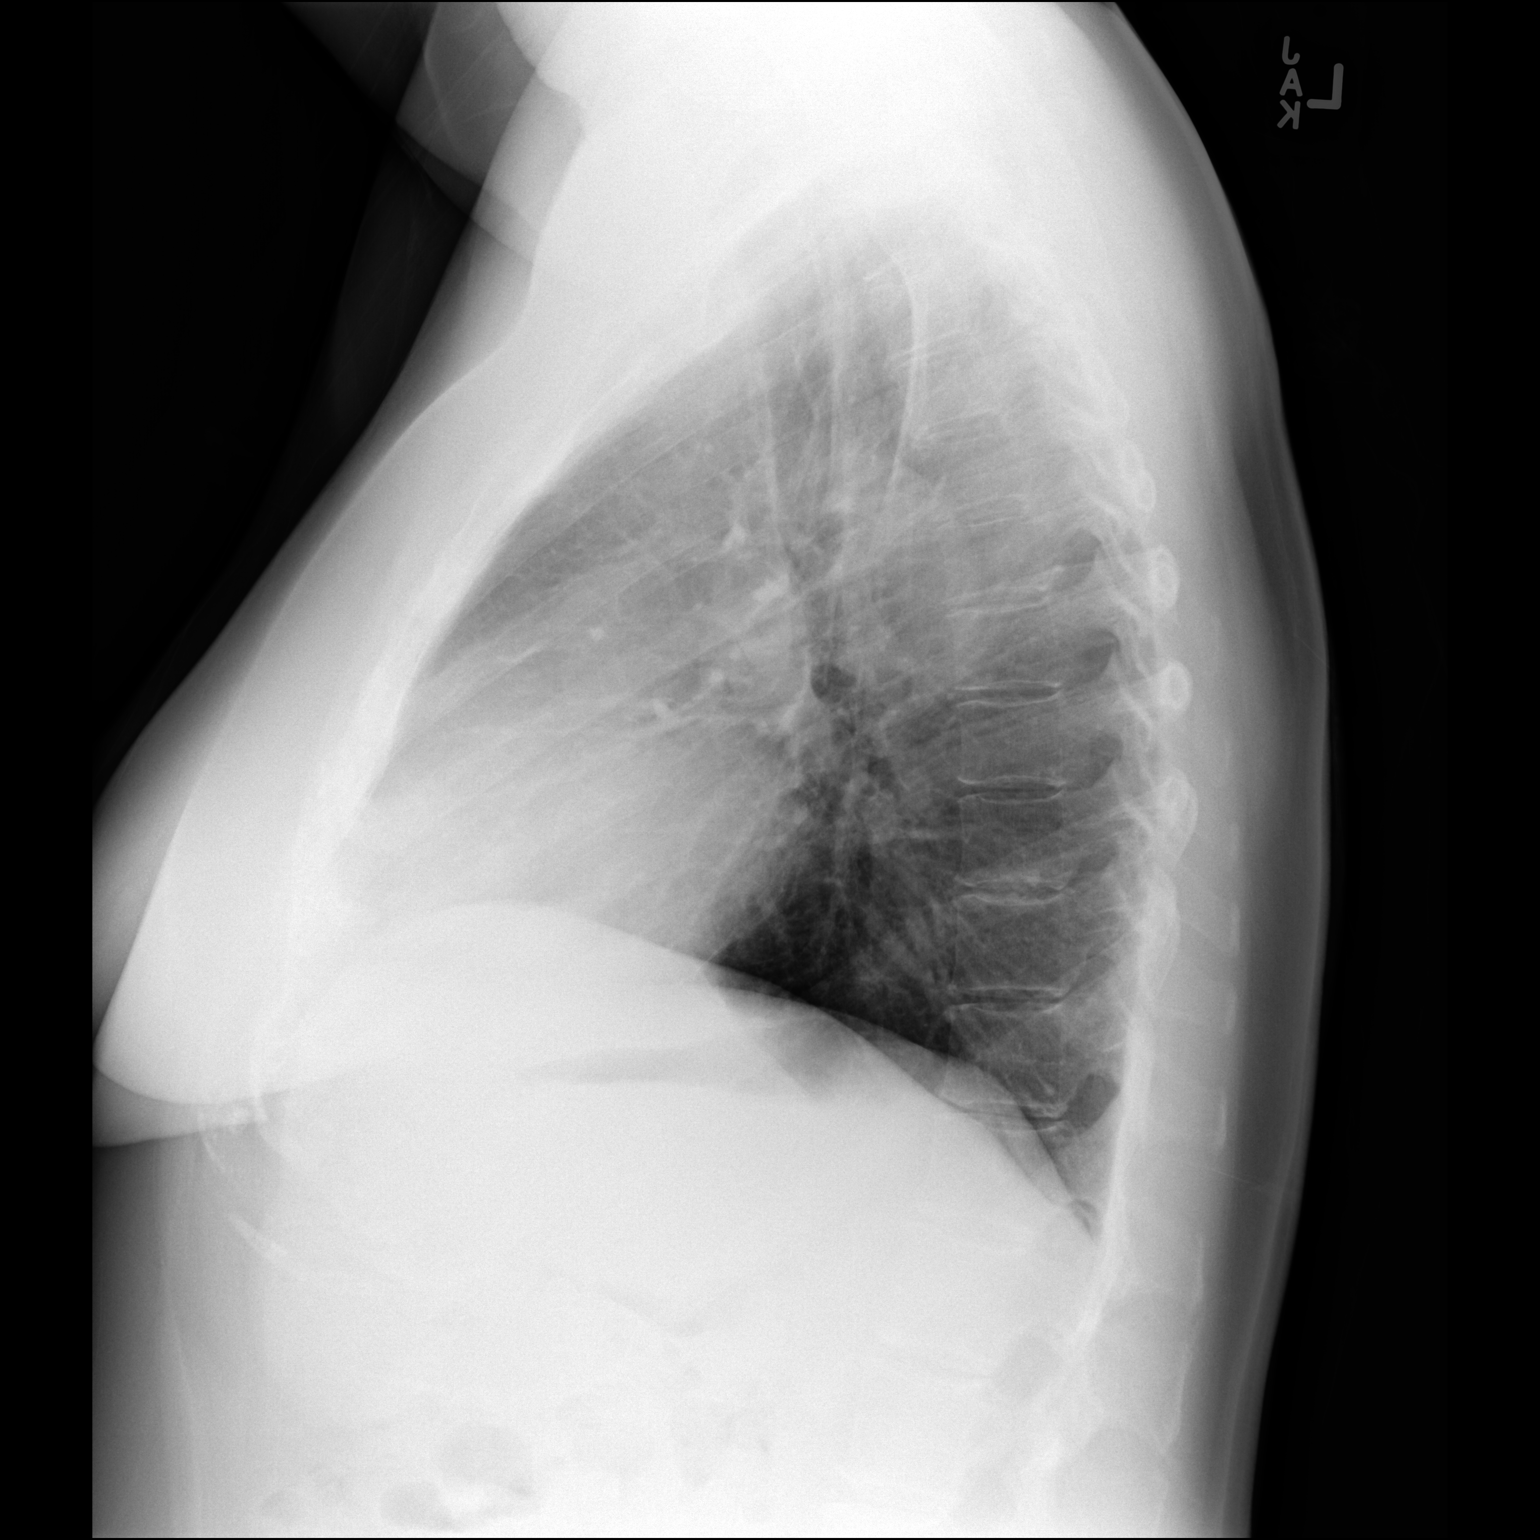

[2 of 2 positions shown; findings below may reference images not displayed]

FINDINGS: Normal mediastinum and cardiac silhouette. Normal pulmonary
vasculature. No evidence of effusion, infiltrate, or pneumothorax.
No acute bony abnormality.
IMPRESSION: No acute cardiopulmonary process.

## 2018-01-22 ENCOUNTER — Encounter: Payer: Self-pay | Admitting: Pulmonary Disease

## 2018-01-22 ENCOUNTER — Ambulatory Visit: Payer: 59 | Admitting: Pulmonary Disease

## 2018-01-22 VITALS — BP 126/80 | HR 68 | Ht 68.0 in | Wt 182.8 lb

## 2018-01-22 DIAGNOSIS — J42 Unspecified chronic bronchitis: Secondary | ICD-10-CM | POA: Diagnosis not present

## 2018-01-22 MED ORDER — ALBUTEROL SULFATE HFA 108 (90 BASE) MCG/ACT IN AERS: 2.0000 | INHALATION_SPRAY | Freq: Four times a day (QID) | RESPIRATORY_TRACT | 2 refills | Status: DC | PRN

## 2018-01-22 MED ORDER — FLUCONAZOLE 150 MG PO TABS: 150.0000 mg | ORAL_TABLET | ORAL | 0 refills | Status: AC | PRN

## 2018-01-22 MED ORDER — LEVOFLOXACIN 750 MG PO TABS: 750.0000 mg | ORAL_TABLET | Freq: Every day | ORAL | 0 refills | Status: AC

## 2018-01-22 NOTE — Progress Notes (Signed)
Synopsis: Referred in 01/2018 for productive cough  Subjective:   PATIENT ID: Kathleen Sweeney GENDER: female DOB: 02/23/1960, MRN: [id number]   HPI  Chief Complaint  Patient presents with  . consult    chest congestion (thick white), wheezing at times and pain under both ribs.  Hx of chronic bronchitis.  Sx on and off for years but getting worse   Ms. Kathleen Sweeney is a 57 year old female former smoker (60 pack year) who presents for productive cough and wheezing.  She has had symptoms of productive cough and chest congestion for several years but has noticed that has worsened in the last few months. She reports productive cough with thick sputum. She has chronic cough with clear sputum that occurs at night. This has changed to a thick sputum. Associated with chest discomfort. She quit smoking 11 years ago. She was treated with steroids and ?antibiotic which felt unchanged. Never used an inhaler. Has tried over the counters including mucinex and symptoms unchanged. Reports wheezing worsens with activity. Reports swollen ankles. Has hoarsenesss. Denies fevers, chills.  Former smoker.  60 pack history.  Quit 11 years ago. Environmental exposures: None  I have personally reviewed patient's past medical/family/social history, allergies, current medications.  Past Medical History:  Diagnosis Date  . Depression   . Fibromyalgia   . Lupus ([place])   . Migraine   . Skin cancer Had cancer removed in 2000  . Thyroid disease      Family History  Problem Relation Age of Onset  . Sleep apnea Mother   . Irregular heart beat Mother   . Hypertension Mother   . Diabetes Mother   . Pneumonia Father   . Sleep apnea Brother      Social History   Occupational History  . Not on file  Tobacco Use  . Smoking status: Former Smoker    Packs/day: 2.00    Years: 30.00    Pack years: 60.00    Types: Cigarettes    Last attempt to quit: 01/22/1997    Years since quitting: 21.0  . Smokeless  tobacco: Never Used  Substance and Sexual Activity  . Alcohol use: No  . Drug use: Not Currently    Types: Marijuana    Comment: CBD oil  . Sexual activity: Never    Allergies  Allergen Reactions  . Erythromycin Anaphylaxis  . Morphine And Related     Not given  . Penicillins Other (See Comments)    Family allergy.-- sister had an allergic reaction and the doctor told her that the whole family would have it. Never actually taken medication.  Has patient had a PCN reaction causing immediate rash, facial/tongue/throat swelling, SOB or lightheadedness with hypotension: No answer  Has patient had a PCN reaction causing severe rash involving mucus membranes or skin necrosis:No answer  Has patient had a PCN reaction that required hospitalization no answer  Has patient had a PCN reaction occurring wit  . Promethazine     "made her sick"     Outpatient Medications Prior to Visit  Medication Sig Dispense Refill  . clonazePAM (KLONOPIN) 0.5 MG tablet Take 0.5 mg by mouth 2 (two) times daily as needed for anxiety.    Marland Kitchen levothyroxine (SYNTHROID, LEVOTHROID) 88 MCG tablet Take 100 mcg by mouth daily.     Marland Kitchen omeprazole (PRILOSEC) 20 MG capsule Take 20 mg by mouth daily.    . pravastatin (PRAVACHOL) 10 MG tablet Take 10 mg by mouth daily.    Marland Kitchen  tizanidine (ZANAFLEX) 6 MG capsule Take 6 mg by mouth 2 (two) times daily.    . brompheniramine-pseudoephedrine (DIMETAPP) 1-15 MG/5ML ELIX Take 5 mLs by mouth 2 (two) times daily as needed for allergies or congestion.    . diphenhydrAMINE (SOMINEX) 25 MG tablet Take 25 mg by mouth daily as needed for allergies or sleep.    Marland Kitchen LORazepam (ATIVAN) 0.5 MG tablet Take 0.5 mg by mouth 2 (two) times daily as needed for anxiety.   5  . naproxen (NAPROSYN) 500 MG tablet Take 1 tablet (500 mg total) by mouth 2 (two) times daily. 20 tablet 0  . Omeprazole Magnesium (PRILOSEC) 10 MG PACK Take 20 mg by mouth daily. 60 each 0  . ondansetron (ZOFRAN) 4 MG tablet Take 1  tablet (4 mg total) by mouth every 8 (eight) hours as needed for nausea or vomiting. 20 tablet 0  . oxyCODONE-acetaminophen (PERCOCET/ROXICET) 5-325 MG per tablet Take 1-2 tablets by mouth every 4 (four) hours as needed for severe pain. 12 tablet 0  . promethazine (PHENERGAN) 25 MG tablet Take 1 tablet (25 mg total) by mouth every 6 (six) hours as needed for nausea or vomiting. 12 tablet 0  . ROZEREM 8 MG tablet Take 8 mg by mouth at bedtime as needed for sleep.   11   No facility-administered medications prior to visit.     Review of Systems  Constitutional: Negative for chills, diaphoresis, fever, malaise/fatigue and weight loss.  HENT: Positive for sore throat. Negative for congestion.   Respiratory: Positive for cough, sputum production, shortness of breath and wheezing. Negative for hemoptysis.   Cardiovascular: Positive for leg swelling and PND. Negative for chest pain and orthopnea.  Gastrointestinal: Negative for abdominal pain, heartburn and nausea.  Genitourinary: Negative for frequency.  Musculoskeletal: Negative for myalgias.  Skin: Negative for rash.  Neurological: Negative for dizziness, weakness and headaches.  Endo/Heme/Allergies: Does not bruise/bleed easily.     Objective:  Physical Exam   Vitals:   01/22/18 1446  BP: 126/80  Pulse: 68  SpO2: 100%  Weight: 182 lb 12.8 oz (82.9 kg)  Height: 5\' 8"  (1.727 m)   SpO2: 100 % O2 Device: None (Room air)  Physical Exam: General: Well-appearing, no acute distress HENT: [place], AT, OP clear, MMM Eyes: EOMI, no scleral icterus Respiratory: Clear to auscultation bilaterally.  No crackles, wheezing or rales Cardiovascular: RRR, -M/R/G, no JVD GI: BS+, soft, nontender Extremities:-Edema,-tenderness Neuro: AAO x4, CNII-XII grossly intact Skin: Intact, no rashes or bruising Psych: Normal mood, normal affect  Chest imaging: CTA 03/02/15 No PE. No effusion or infiltrate  PFT: None on file  I have personally reviewed  the above labs, images and tests noted above.    Assessment & Plan:  #Chronic bronchitis #History of tobacco abuse  Discussion: 57 year old female former smoker with 60 pack history who presents with symptoms consistent with chronic bronchitis.  Will evaluate with PFTs to rule out obstructive lung disease.  Plan: START albuterol inhaler every 4 hours for shortness of breath and wheezing  START Z-pack for chronic bronchitis ORDER fluconazole as needed for yeast infection  ORDER Pulmonary function tests to evaluate your breathing REFER to low dose lung CT lung screening  Follow-up in 1 month  Orders Placed This Encounter  Procedures  . Ambulatory Referral for Lung Cancer Scre    Referral Priority:   Routine    Referral Type:   Consultation    Referral Reason:   Specialty Services Required    Number  of Visits Requested:   1  . Pulmonary function test    Standing Status:   Future    Standing Expiration Date:   01/23/2019    Scheduling Instructions:     Schedule before or with next OV  -  ROV in 1 month.    Order Specific Question:   Where should this test be performed?    Answer:   [facility]    Order Specific Question:   Full PFT: includes the following: basic spirometry, spirometry pre & post bronchodilator, diffusion capacity (DLCO), lung volumes    Answer:   Full PFT   Meds ordered this encounter  Medications  . albuterol (PROVENTIL HFA;VENTOLIN HFA) 108 (90 Base) MCG/ACT inhaler    Sig: Inhale 2 puffs into the lungs every 6 (six) hours as needed for wheezing or shortness of breath.    Dispense:  1 Inhaler    Refill:  2  . levofloxacin (LEVAQUIN) 750 MG tablet    Sig: Take 1 tablet (750 mg total) by mouth daily for 5 days.    Dispense:  5 tablet    Refill:  0  . fluconazole (DIFLUCAN) 150 MG tablet    Sig: Take 1 tablet (150 mg total) by mouth as needed for up to 1 day.    Dispense:  1 tablet    Refill:  0    Return in about 1 month (around  02/22/2018).   Thank you for choosing [facility] for your health needs!   Javarian Jakubiak Rodman Pickle, MD [facility] 01/25/2018 3:30 PM  Personal pager: [phone] If unanswered, please page CCM On-call: [phone]

## 2018-01-22 NOTE — Patient Instructions (Addendum)
START albuterol inhaler every 4 hours for shortness of breath and wheezing   START Z-pack for chronic bronchitis  ORDER fluconazole as needed for yeast infection   ORDER Pulmonary function tests to evaluate your breathing  REFER to low dose lung CT lung screening  Follow-up in 1 month

## 2018-01-25 ENCOUNTER — Encounter: Payer: Self-pay | Admitting: Pulmonary Disease

## 2018-01-27 ENCOUNTER — Other Ambulatory Visit: Payer: Self-pay | Admitting: Acute Care

## 2018-01-27 DIAGNOSIS — Z87891 Personal history of nicotine dependence: Secondary | ICD-10-CM

## 2018-01-27 DIAGNOSIS — Z122 Encounter for screening for malignant neoplasm of respiratory organs: Secondary | ICD-10-CM

## 2018-02-17 ENCOUNTER — Encounter: Payer: 59 | Admitting: Acute Care

## 2018-02-17 ENCOUNTER — Inpatient Hospital Stay: Admission: RE | Admit: 2018-02-17 | Payer: 59 | Source: Ambulatory Visit

## 2018-02-17 ENCOUNTER — Telehealth: Payer: Self-pay

## 2018-02-17 NOTE — Telephone Encounter (Signed)
Kathleen Sweeney,  This pt was scheduled for Lung cancer screening but was a no show today, 02/17/2017. SG wanted me to send you a message to make you aware.

## 2018-02-19 NOTE — Telephone Encounter (Signed)
Spoke with pt to get her rescheduled for lung cancer screening.  Pt states she has changed to [employer].  Pt advised that [employer] does not cover this and was given the out of pocket cost options and also the option of grant program through [facility].  Pt states she cannot afford to pay out of pocket at this time and is not interested in going to [facility]. Pt would like to be put on list or possible grant through [employer].  Will add pt to this list and call pt when we have more info.

## 2018-02-24 ENCOUNTER — Telehealth: Payer: Self-pay | Admitting: Pulmonary Disease

## 2018-02-24 NOTE — Telephone Encounter (Signed)
FYI for Dr Loanne Drilling:  Kathleen Sweeney with pt regarding rescheduling lung cancer screening.  Pt advised that I could give her the billing codes for [facility] and [facility] and she could check her coverage through [facility].  PT declined and states she does not wish to participate in lung cancer screening.  Referral cancelled.  PT also cancelled f/u appt with Dr Loanne Drilling and PFT.

## 2018-02-27 ENCOUNTER — Ambulatory Visit: Payer: 59 | Admitting: Pulmonary Disease

## 2018-03-11 DIAGNOSIS — R399 Unspecified symptoms and signs involving the genitourinary system: Secondary | ICD-10-CM | POA: Diagnosis not present

## 2018-03-11 DIAGNOSIS — F5101 Primary insomnia: Secondary | ICD-10-CM | POA: Diagnosis not present

## 2018-03-11 DIAGNOSIS — F331 Major depressive disorder, recurrent, moderate: Secondary | ICD-10-CM | POA: Diagnosis not present

## 2018-03-20 ENCOUNTER — Other Ambulatory Visit: Payer: Self-pay

## 2018-03-20 ENCOUNTER — Ambulatory Visit: Payer: 59 | Admitting: Pulmonary Disease

## 2018-03-20 MED ORDER — ALBUTEROL SULFATE HFA 108 (90 BASE) MCG/ACT IN AERS: 2.0000 | INHALATION_SPRAY | Freq: Four times a day (QID) | RESPIRATORY_TRACT | 3 refills | Status: DC | PRN

## 2018-03-20 NOTE — Progress Notes (Signed)
Thank you. JE

## 2018-03-20 NOTE — Progress Notes (Signed)
[facility] sent a fax requesting the following:  Patient has [employer] for [employer] - requires [employer] or ventolin Rx.  Cannot change prescription due to lack of refills.  Please send new Rx for [employer] or Ventolin.  [facility]

## 2018-06-14 DIAGNOSIS — G4709 Other insomnia: Secondary | ICD-10-CM | POA: Diagnosis not present

## 2018-06-14 DIAGNOSIS — F331 Major depressive disorder, recurrent, moderate: Secondary | ICD-10-CM | POA: Diagnosis not present

## 2018-07-14 DIAGNOSIS — Z0000 Encounter for general adult medical examination without abnormal findings: Secondary | ICD-10-CM | POA: Diagnosis not present

## 2018-07-24 DIAGNOSIS — G4709 Other insomnia: Secondary | ICD-10-CM | POA: Diagnosis not present

## 2018-07-24 DIAGNOSIS — F410 Panic disorder [episodic paroxysmal anxiety] without agoraphobia: Secondary | ICD-10-CM | POA: Diagnosis not present

## 2018-07-24 DIAGNOSIS — F3342 Major depressive disorder, recurrent, in full remission: Secondary | ICD-10-CM | POA: Diagnosis not present

## 2018-09-10 ENCOUNTER — Other Ambulatory Visit: Payer: Self-pay | Admitting: Family Medicine

## 2018-09-10 DIAGNOSIS — Z1231 Encounter for screening mammogram for malignant neoplasm of breast: Secondary | ICD-10-CM

## 2018-09-17 ENCOUNTER — Ambulatory Visit
Admission: RE | Admit: 2018-09-17 | Discharge: 2018-09-17 | Disposition: A | Discharge status: Home / Self Care | Payer: BC Managed Care – PPO | Source: Ambulatory Visit | Attending: Family Medicine | Admitting: Family Medicine

## 2018-09-17 ENCOUNTER — Other Ambulatory Visit: Payer: Self-pay

## 2018-09-17 DIAGNOSIS — Z1231 Encounter for screening mammogram for malignant neoplasm of breast: Secondary | ICD-10-CM

## 2018-09-17 IMAGING — MG DIGITAL SCREENING BILATERAL MAMMOGRAM WITH TOMO AND CAD
8 series · 8 of 24 positions shown · non-contrast
Comparison: Previous exam(s).

CLINICAL DATA: Screening.

EXAM:
DIGITAL SCREENING BILATERAL MAMMOGRAM WITH TOMO AND CAD

[L CC synth-2D]
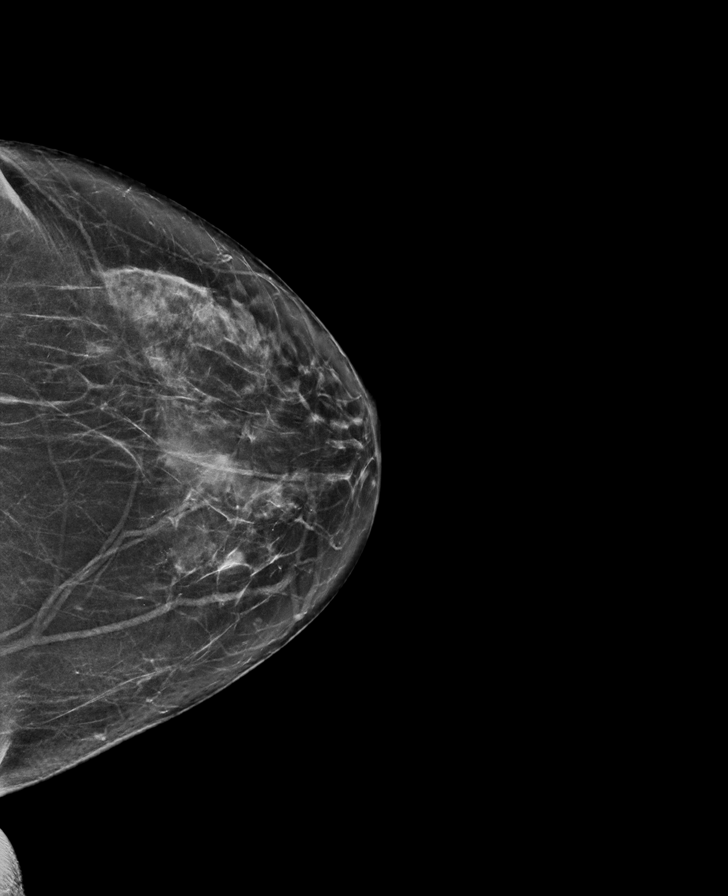

[L MLO synth-2D]
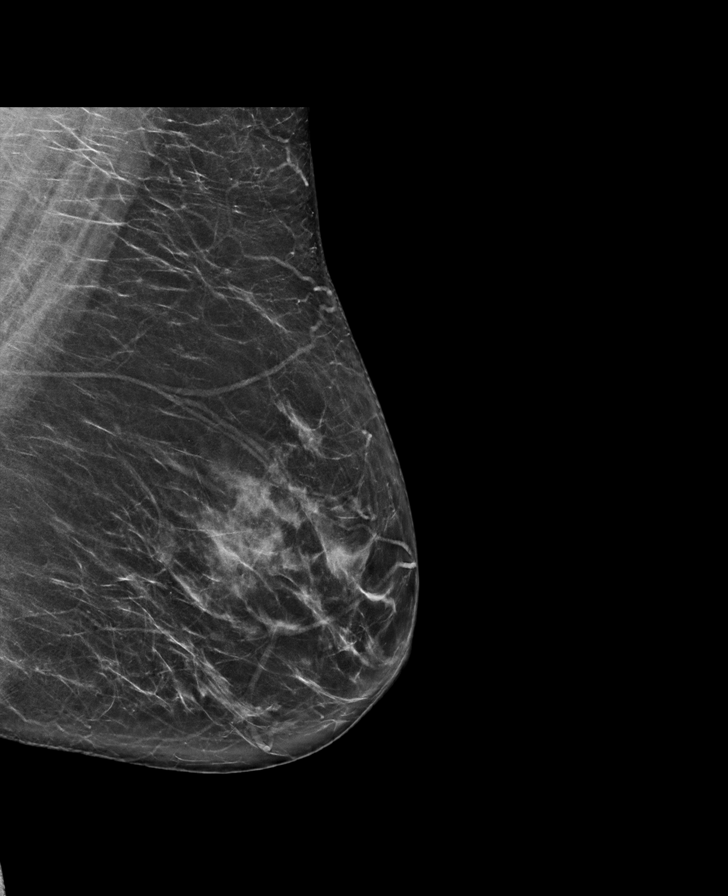

[R MLO synth-2D]
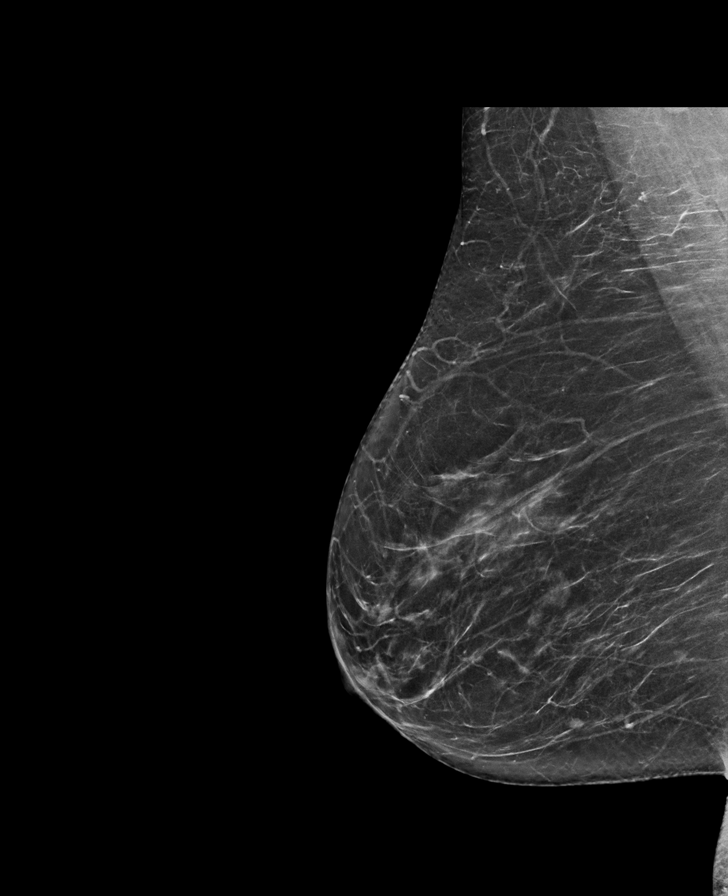

[R CC synth-2D]
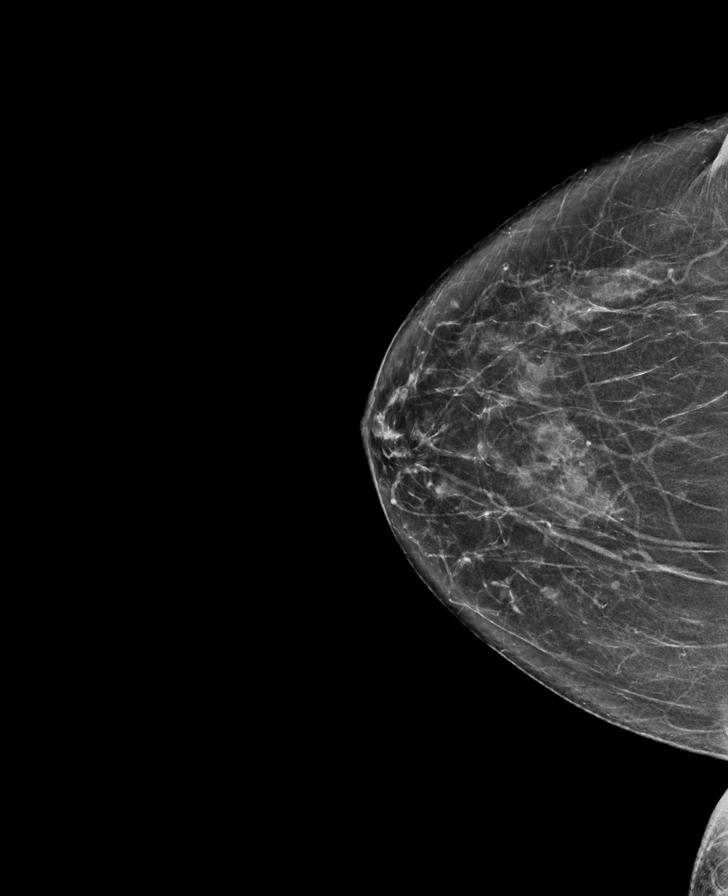

[L MLO tomo · tomo slice 36/71.0]
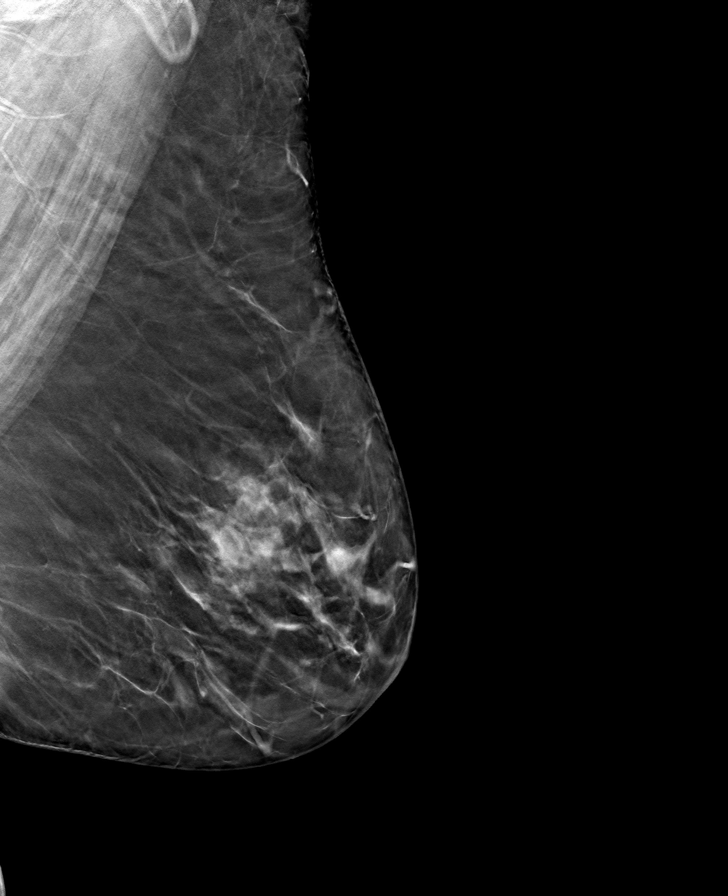

[R CC tomo · tomo slice 32/63.0]
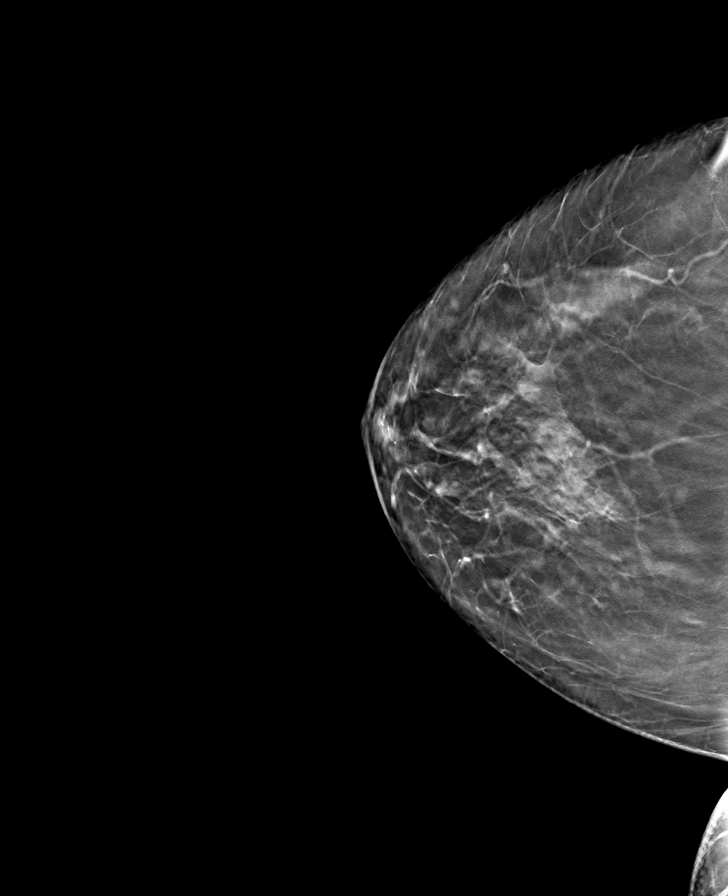

[R MLO tomo · tomo slice 35/68.0]
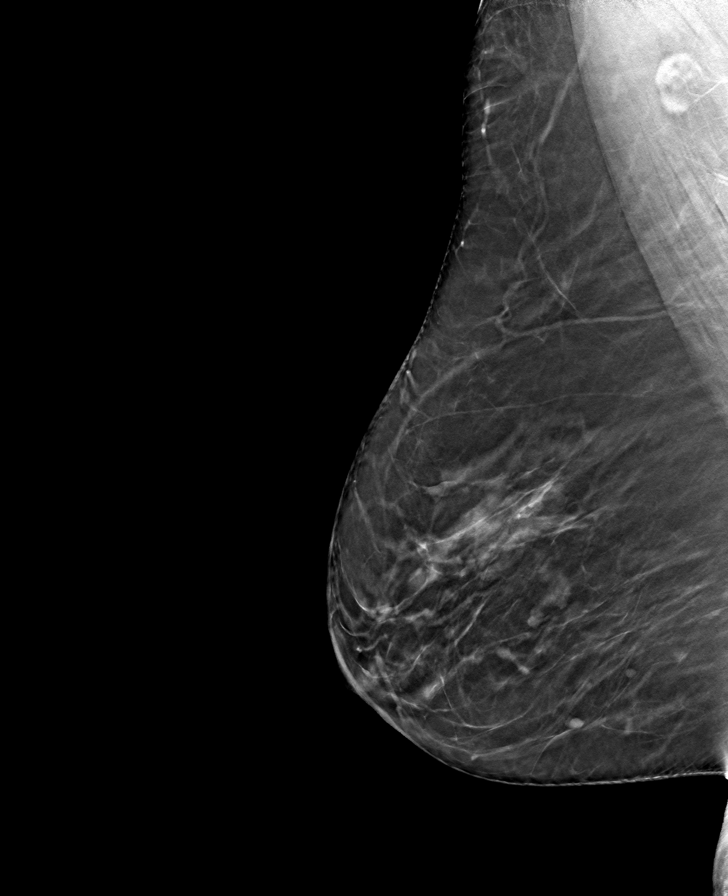

[L CC tomo · tomo slice 33/66.0]
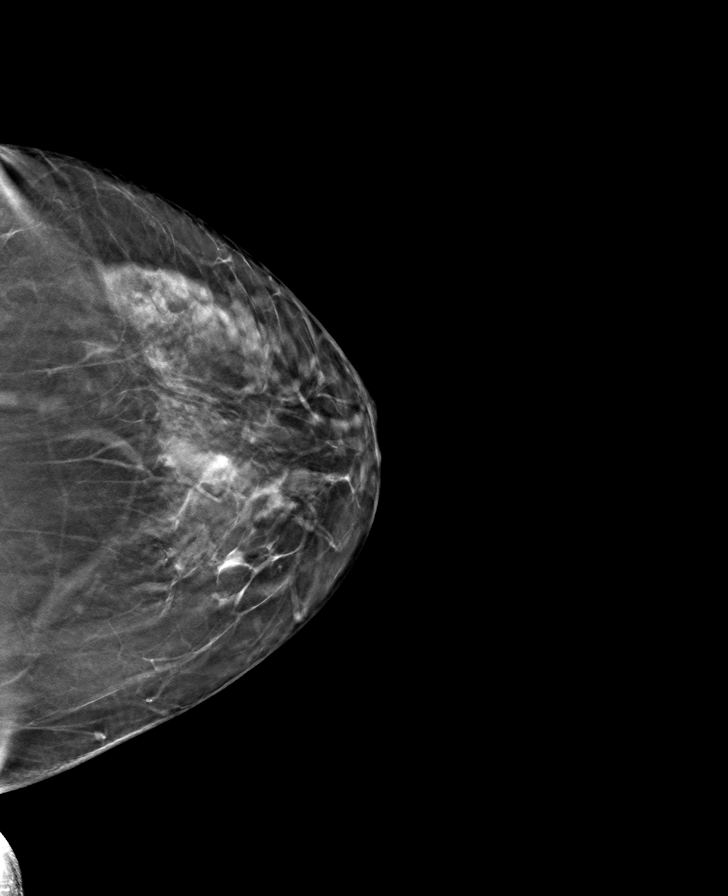

[8 of 24 positions shown; findings below may reference images not displayed]

ACR Breast Density Category c: The breast tissue is heterogeneously
dense, which may obscure small masses.
FINDINGS: There are no findings suspicious for malignancy. Images were
processed with CAD.
IMPRESSION: No mammographic evidence of malignancy. A result letter of this
screening mammogram will be mailed directly to the patient.

RECOMMENDATION:
Screening mammogram in one year. (Code:[5V])

BI-RADS CATEGORY  1: Negative.

## 2018-10-21 DIAGNOSIS — H02403 Unspecified ptosis of bilateral eyelids: Secondary | ICD-10-CM | POA: Diagnosis not present

## 2019-01-20 DIAGNOSIS — H02403 Unspecified ptosis of bilateral eyelids: Secondary | ICD-10-CM | POA: Diagnosis not present

## 2019-02-25 ENCOUNTER — Encounter: Payer: Self-pay | Admitting: *Deleted

## 2019-03-02 ENCOUNTER — Ambulatory Visit (INDEPENDENT_AMBULATORY_CARE_PROVIDER_SITE_OTHER): Payer: BC Managed Care – PPO | Admitting: Diagnostic Neuroimaging

## 2019-03-02 ENCOUNTER — Other Ambulatory Visit: Payer: Self-pay

## 2019-03-02 ENCOUNTER — Encounter: Payer: Self-pay | Admitting: Diagnostic Neuroimaging

## 2019-03-02 VITALS — BP 103/74 | HR 78 | Temp 97.4°F | Ht 68.0 in | Wt 181.6 lb

## 2019-03-02 DIAGNOSIS — H02403 Unspecified ptosis of bilateral eyelids: Secondary | ICD-10-CM | POA: Diagnosis not present

## 2019-03-02 DIAGNOSIS — R531 Weakness: Secondary | ICD-10-CM

## 2019-03-02 MED ORDER — PYRIDOSTIGMINE BROMIDE 60 MG PO TABS: 30.0000 mg | ORAL_TABLET | Freq: Three times a day (TID) | ORAL | 6 refills | Status: DC

## 2019-03-02 NOTE — Patient Instructions (Signed)
-   check myasthenia gravis labs  - trial of pyridostigmine 30mg  twice a day; increase up to 60mg  three times a day as tolerated

## 2019-03-02 NOTE — Progress Notes (Signed)
[facility]  PATIENT: Kathleen Sweeney DOB: [birth year]  REFERRING CLINICIAN: Maurice Small, MD HISTORY FROM: patient  REASON FOR VISIT: new consult    HISTORICAL  CHIEF COMPLAINT:  Chief Complaint  Patient presents with  . Facial Droop    rm 7 New Pt, "eye dr noticed my left eye drooped, was going to do upper eye lift; I've been falling, legs weak; go to my left"     HISTORY OF PRESENT ILLNESS:   59 year old female here for evaluation of ptosis.  Patient has had at least 2 years of gradual onset progressive drooping eyelids, left worse than right.  Patient went to eye doctor for routine checkup and was complaining of ptosis.  She was referred to another eye doctor who raised possibility of myasthenia gravis.  Patient referred here for further evaluation.  Patient also been having lower extremity weakness, left worse than right.  She is also having some slurred speech and trouble swallowing.  No specific triggering or aggravating factors.  Symptoms slightly fluctuate.  She has some blurred vision as well.  Sometimes her ptosis obstructs her vision.  No shortness of breath.    REVIEW OF SYSTEMS: Full 14 system review of systems performed and negative with exception of: As per HPI.  ALLERGIES: Allergies  Allergen Reactions  . Erythromycin Anaphylaxis    Chest pain  . Morphine And Related Nausea And Vomiting    Not given  . Penicillins Other (See Comments)    Family allergy.-- sister had an allergic reaction and the doctor told her that the whole family would have it. Never actually taken medication.  Has patient had a PCN reaction causing immediate rash, facial/tongue/throat swelling, SOB or lightheadedness with hypotension: No answer  Has patient had a PCN reaction causing severe rash involving mucus membranes or skin necrosis:No answer  Has patient had a PCN reaction that required hospitalization no answer  Has patient had a PCN reaction occurring wit  .  Promethazine     "made her sick"  . Simvastatin Other (See Comments)    Muscle aches    HOME MEDICATIONS: Outpatient Medications Prior to Visit  Medication Sig Dispense Refill  . clonazePAM (KLONOPIN) 2 MG tablet TAKE 1/2 TO 1 TABLET BY MOUTH TWICE A DAY AS NEEDED    . doxepin (SINEQUAN) 10 MG capsule Take 10 mg by mouth. 1-2 at bedtime, do not take with trazodone    . estradiol (VIVELLE-DOT) 0.1 MG/24HR patch Place 1 patch onto the skin 2 (two) times a week.    . levothyroxine (SYNTHROID) 100 MCG tablet Take 100 mcg by mouth daily.    Marland Kitchen omeprazole (PRILOSEC) 20 MG capsule Take 20 mg by mouth daily.    . pravastatin (PRAVACHOL) 10 MG tablet Take 10 mg by mouth daily.    Marland Kitchen tiZANidine (ZANAFLEX) 4 MG tablet 4 mg.    . traZODone (DESYREL) 50 MG tablet Take 50 mg by mouth at bedtime. 1-3 at bedtime as needed    . albuterol (PROAIR HFA) 108 (90 Base) MCG/ACT inhaler Inhale 2 puffs into the lungs every 6 (six) hours as needed for wheezing or shortness of breath. (Patient not taking: Reported on 03/02/2019) 1 Inhaler 3  . albuterol (PROVENTIL HFA;VENTOLIN HFA) 108 (90 Base) MCG/ACT inhaler Inhale 2 puffs into the lungs every 6 (six) hours as needed for wheezing or shortness of breath. 1 Inhaler 2  . clonazePAM (KLONOPIN) 0.5 MG tablet Take 0.5 mg by mouth 2 (two) times daily as needed  for anxiety.    Marland Kitchen levothyroxine (SYNTHROID, LEVOTHROID) 88 MCG tablet Take 100 mcg by mouth daily.     . tizanidine (ZANAFLEX) 6 MG capsule Take 6 mg by mouth 2 (two) times daily.     No facility-administered medications prior to visit.    PAST MEDICAL HISTORY: Past Medical History:  Diagnosis Date  . CKD (chronic kidney disease)   . Depression   . Facial droop   . Fibromyalgia   . Hyperlipidemia   . Hypertension   . Hypothyroid   . IBS (irritable bowel syndrome)   . Insomnia   . Lupus ([place])   . Migraine   . RLS (restless legs syndrome)   . Skin cancer Had cancer removed in 2000  . Thyroid disease      PAST SURGICAL HISTORY: Past Surgical History:  Procedure Laterality Date  . ABDOMINAL HYSTERECTOMY  2008  . APPENDECTOMY  1998  . thoracic tumor  03/1995   excision    FAMILY HISTORY: Family History  Problem Relation Age of Onset  . Sleep apnea Mother   . Irregular heart beat Mother        A fib  . Hypertension Mother   . Diabetes Mother   . Dementia Mother   . Pneumonia Father   . Sleep apnea Brother   . Dementia Maternal Grandmother     SOCIAL HISTORY: Social History   Socioeconomic History  . Marital status: Married    Spouse name: Araceli Bouche  . Number of children: 2  . Years of education: Not on file  . Highest education level: High school graduate  Occupational History    Comment: na  Tobacco Use  . Smoking status: Former Smoker    Packs/day: 2.00    Years: 30.00    Pack years: 60.00    Types: Cigarettes    Quit date: 06/23/2006    Years since quitting: 12.6  . Smokeless tobacco: Never Used  Substance and Sexual Activity  . Alcohol use: No  . Drug use: Not Currently    Types: Marijuana, Other-see comments    Comment: CBD oil, last used 2020  . Sexual activity: Never  Other Topics Concern  . Not on file  Social History Narrative   Lives with spouse   Caffeine-tea 3-4 weekly   Social Determinants of Health   Financial Resource Strain:   . Difficulty of Paying Living Expenses: Not on file  Food Insecurity:   . Worried About Charity fundraiser in the Last Year: Not on file  . Ran Out of Food in the Last Year: Not on file  Transportation Needs:   . Lack of Transportation (Medical): Not on file  . Lack of Transportation (Non-Medical): Not on file  Physical Activity:   . Days of Exercise per Week: Not on file  . Minutes of Exercise per Session: Not on file  Stress:   . Feeling of Stress : Not on file  Social Connections:   . Frequency of Communication with Friends and Family: Not on file  . Frequency of Social Gatherings with Friends and Family:  Not on file  . Attends Religious Services: Not on file  . Active Member of Clubs or Organizations: Not on file  . Attends Archivist Meetings: Not on file  . Marital Status: Not on file  Intimate Partner Violence:   . Fear of Current or Ex-Partner: Not on file  . Emotionally Abused: Not on file  . Physically Abused: Not on file  .  Sexually Abused: Not on file     PHYSICAL EXAM  GENERAL EXAM/CONSTITUTIONAL: Vitals:  Vitals:   03/02/19 1024  BP: 103/74  Pulse: 78  Temp: (!) 97.4 F (36.3 C)  Weight: 181 lb 9.6 oz (82.4 kg)  Height: 5\' 8"  (1.727 m)     Body mass index is 27.61 kg/m. Wt Readings from Last 3 Encounters:  03/02/19 181 lb 9.6 oz (82.4 kg)  01/22/18 182 lb 12.8 oz (82.9 kg)  10/25/14 190 lb (86.2 kg)     Patient is in no distress; well developed, nourished and groomed; neck is supple  CARDIOVASCULAR:  Examination of carotid arteries is normal; no carotid bruits  Regular rate and rhythm, no murmurs  Examination of peripheral vascular system by observation and palpation is normal  EYES:  Ophthalmoscopic exam of optic discs and posterior segments is normal; no papilledema or hemorrhages  No exam data present  MUSCULOSKELETAL:  Gait, strength, tone, movements noted in Neurologic exam below  NEUROLOGIC: MENTAL STATUS:  No flowsheet data found.  awake, alert, oriented to person, place and time  recent and remote memory intact  normal attention and concentration  language fluent, comprehension intact, naming intact  fund of knowledge appropriate  CRANIAL NERVE:   2nd - no papilledema on fundoscopic exam  2nd, 3rd, 4th, 6th - pupils equal and reactive to light, visual fields full to confrontation, extraocular muscles intact, no nystagmus; EXCEPT BILATERAL PTOSIS (LEFT WORSE THAN RIGHT; FATIGABLE WEAKNESS IN EYES WITH 1 MINUTE UPGAZE)  5th - facial sensation symmetric  7th - facial strength symmetric  8th - hearing  intact  9th - palate elevates symmetrically, uvula midline  11th - shoulder shrug symmetric  12th - tongue protrusion midline  MOTOR:   normal bulk and tone, full strength in the BUE, BLE; EXCEPT BILATERAL DELTOIDS 4; LEFT HF 3; RIGHT HF 4  SENSORY:   normal and symmetric to light touch, temperature, vibration  COORDINATION:   finger-nose-finger, fine finger movements normal  REFLEXES:   deep tendon reflexes present and symmetric  GAIT/STATION:   narrow based gait; CAUTIOUS UNSTEADY GAIT     DIAGNOSTIC DATA (LABS, IMAGING, TESTING) - I reviewed patient records, labs, notes, testing and imaging myself where available.  Lab Results  Component Value Date   WBC 8.3 03/01/2015   HGB 14.9 03/01/2015   HCT 44.4 03/01/2015   MCV 85.5 03/01/2015   PLT 268 03/01/2015      Component Value Date/Time   NA 140 03/01/2015 1950   K 4.0 03/01/2015 1950   CL 103 03/01/2015 1950   CO2 24 03/01/2015 1950   GLUCOSE 144 (H) 03/01/2015 1950   BUN 10 03/01/2015 1950   CREATININE 1.19 (H) 03/01/2015 1950   CALCIUM 9.7 03/01/2015 1950   PROT 8.1 10/25/2014 1125   ALBUMIN 4.2 10/25/2014 1125   AST 24 10/25/2014 1125   ALT 11 (L) 10/25/2014 1125   ALKPHOS 70 10/25/2014 1125   BILITOT 0.4 10/25/2014 1125   GFRNONAA 50 (L) 03/01/2015 1950   GFRAA 58 (L) 03/01/2015 1950   No results found for: CHOL, HDL, LDLCALC, LDLDIRECT, TRIG, CHOLHDL No results found for: HGBA1C No results found for: VITAMINB12 Lab Results  Component Value Date   TSH 3.680 04/28/2011      ASSESSMENT AND PLAN  59 y.o. year old female here with gradual onset progressive ptosis, weakness of lower extremities, slurred speech and trouble swallowing since 2018.  Signs and symptoms concerning for myasthenia gravis.  We will proceed  with further work-up  Dx:  1. Weakness   2. Ptosis of both eyelids     PLAN:  - check myasthenia gravis labs - trial of pyridostigmine 30mg  twice a day; increase up to  60mg  three times a day as tolerated  Orders Placed This Encounter  Procedures  . AChR Abs with Reflex to MuSK  . TSH  . CK  . Aldolase  . CBC with Differential/Platelet  . Comprehensive metabolic panel   Meds ordered this encounter  Medications  . pyridostigmine (MESTINON) 60 MG tablet    Sig: Take 0.5-1 tablets (30-60 mg total) by mouth 3 (three) times daily.    Dispense:  30 tablet    Refill:  6   Return in about 3 months (around 05/31/2019).    [name], MD [id number], [id number] Certified in Neurology, Neurophysiology and Neuroimaging  [facility] [address], [place], [place] [address] [phone]

## 2019-03-05 DIAGNOSIS — S39012A Strain of muscle, fascia and tendon of lower back, initial encounter: Secondary | ICD-10-CM | POA: Diagnosis not present

## 2019-03-09 ENCOUNTER — Telehealth: Payer: Self-pay | Admitting: *Deleted

## 2019-03-12 LAB — COMPREHENSIVE METABOLIC PANEL
ALT: 17 IU/L (ref 0–32)
AST: 34 IU/L (ref 0–40)
Albumin/Globulin Ratio: 1.4 (ref 1.2–2.2)
Albumin: 4.2 g/dL (ref 3.8–4.9)
Alkaline Phosphatase: 84 IU/L (ref 39–117)
BUN/Creatinine Ratio: 10 — ABNORMAL LOW (ref 12–28)
BUN: 10 mg/dL (ref 8–27)
Bilirubin Total: 0.4 mg/dL (ref 0.0–1.2)
CO2: 27 mmol/L (ref 20–29)
Calcium: 9.5 mg/dL (ref 8.7–10.3)
Chloride: 101 mmol/L (ref 96–106)
Creatinine, Ser: 0.96 mg/dL (ref 0.57–1.00)
GFR calc Af Amer: 74 mL/min/{1.73_m2} (ref >59)
GFR calc non Af Amer: 64 mL/min/{1.73_m2} (ref >59)
Globulin, Total: 2.9 g/dL (ref 1.5–4.5)
Glucose: 107 mg/dL — ABNORMAL HIGH (ref 65–99)
Potassium: 4 mmol/L (ref 3.5–5.2)
Sodium: 138 mmol/L (ref 134–144)
Total Protein: 7.1 g/dL (ref 6.0–8.5)

## 2019-03-12 LAB — CBC WITH DIFFERENTIAL/PLATELET
Basophils Absolute: 0.1 10*3/uL (ref 0.0–0.2)
Basos: 1 %
EOS (ABSOLUTE): 0.2 10*3/uL (ref 0.0–0.4)
Eos: 2 %
Hematocrit: 45.1 % (ref 34.0–46.6)
Hemoglobin: 15.1 g/dL (ref 11.1–15.9)
Immature Grans (Abs): 0 10*3/uL (ref 0.0–0.1)
Immature Granulocytes: 0 %
Lymphocytes Absolute: 1.9 10*3/uL (ref 0.7–3.1)
Lymphs: 27 %
MCH: 28.1 pg (ref 26.6–33.0)
MCHC: 33.5 g/dL (ref 31.5–35.7)
MCV: 84 fL (ref 79–97)
Monocytes Absolute: 0.4 10*3/uL (ref 0.1–0.9)
Monocytes: 6 %
Neutrophils Absolute: 4.7 10*3/uL (ref 1.4–7.0)
Neutrophils: 64 %
Platelets: 231 10*3/uL (ref 150–450)
RBC: 5.38 x10E6/uL — ABNORMAL HIGH (ref 3.77–5.28)
RDW: 13.2 % (ref 11.7–15.4)
WBC: 7.2 10*3/uL (ref 3.4–10.8)

## 2019-03-12 LAB — ALDOLASE: Aldolase: 5.4 U/L (ref 3.3–10.3)

## 2019-03-12 LAB — MUSK ANTIBODIES: MuSK Antibodies: <1 U/mL

## 2019-03-12 LAB — CK: Total CK: 94 U/L (ref 32–182)

## 2019-03-12 LAB — TSH: TSH: 0.248 u[IU]/mL — ABNORMAL LOW (ref 0.450–4.500)

## 2019-03-12 LAB — ACHR ABS WITH REFLEX TO MUSK: AChR Binding Ab, Serum: 0.05 nmol/L (ref 0.00–0.24)

## 2019-03-16 ENCOUNTER — Encounter: Payer: Self-pay | Admitting: *Deleted

## 2019-03-16 NOTE — Telephone Encounter (Addendum)
Attempted to reach patient on home #, unable to LVM, memory full. Called mobile, immediately received message: the person you are trying to reach has a VM that's not been set up. Mailed result letter and advised she FU with PCP re: thyroid lab results.

## 2019-03-31 ENCOUNTER — Encounter (HOSPITAL_COMMUNITY): Payer: Self-pay | Admitting: Emergency Medicine

## 2019-03-31 ENCOUNTER — Emergency Department (HOSPITAL_COMMUNITY)
Admission: EM | Admit: 2019-03-31 | Discharge: 2019-03-31 | Disposition: A | Discharge status: Home / Self Care | Payer: BC Managed Care – PPO | Attending: Emergency Medicine | Admitting: Emergency Medicine

## 2019-03-31 ENCOUNTER — Other Ambulatory Visit: Payer: Self-pay

## 2019-03-31 ENCOUNTER — Emergency Department (HOSPITAL_COMMUNITY): Payer: BC Managed Care – PPO

## 2019-03-31 DIAGNOSIS — R6889 Other general symptoms and signs: Secondary | ICD-10-CM

## 2019-03-31 DIAGNOSIS — Z79899 Other long term (current) drug therapy: Secondary | ICD-10-CM | POA: Insufficient documentation

## 2019-03-31 DIAGNOSIS — B349 Viral infection, unspecified: Secondary | ICD-10-CM

## 2019-03-31 DIAGNOSIS — N189 Chronic kidney disease, unspecified: Secondary | ICD-10-CM | POA: Diagnosis not present

## 2019-03-31 DIAGNOSIS — E039 Hypothyroidism, unspecified: Secondary | ICD-10-CM | POA: Insufficient documentation

## 2019-03-31 DIAGNOSIS — J111 Influenza due to unidentified influenza virus with other respiratory manifestations: Secondary | ICD-10-CM | POA: Insufficient documentation

## 2019-03-31 DIAGNOSIS — R0602 Shortness of breath: Secondary | ICD-10-CM | POA: Diagnosis not present

## 2019-03-31 DIAGNOSIS — R05 Cough: Secondary | ICD-10-CM | POA: Diagnosis not present

## 2019-03-31 DIAGNOSIS — R079 Chest pain, unspecified: Secondary | ICD-10-CM | POA: Diagnosis not present

## 2019-03-31 DIAGNOSIS — I129 Hypertensive chronic kidney disease with stage 1 through stage 4 chronic kidney disease, or unspecified chronic kidney disease: Secondary | ICD-10-CM | POA: Insufficient documentation

## 2019-03-31 DIAGNOSIS — Z20822 Contact with and (suspected) exposure to covid-19: Secondary | ICD-10-CM

## 2019-03-31 DIAGNOSIS — M791 Myalgia, unspecified site: Secondary | ICD-10-CM | POA: Diagnosis not present

## 2019-03-31 DIAGNOSIS — Z87891 Personal history of nicotine dependence: Secondary | ICD-10-CM | POA: Diagnosis not present

## 2019-03-31 LAB — CBC WITH DIFFERENTIAL/PLATELET
Abs Immature Granulocytes: 0.02 10*3/uL (ref 0.00–0.07)
Basophils Absolute: 0.1 10*3/uL (ref 0.0–0.1)
Basophils Relative: 1 %
Eosinophils Absolute: 0 10*3/uL (ref 0.0–0.5)
Eosinophils Relative: 1 %
HCT: 47.1 % — ABNORMAL HIGH (ref 36.0–46.0)
Hemoglobin: 15.8 g/dL — ABNORMAL HIGH (ref 12.0–15.0)
Immature Granulocytes: 0 %
Lymphocytes Relative: 24 %
Lymphs Abs: 2.1 10*3/uL (ref 0.7–4.0)
MCH: 27.8 pg (ref 26.0–34.0)
MCHC: 33.5 g/dL (ref 30.0–36.0)
MCV: 82.9 fL (ref 80.0–100.0)
Monocytes Absolute: 0.5 10*3/uL (ref 0.1–1.0)
Monocytes Relative: 5 %
Neutro Abs: 6.1 10*3/uL (ref 1.7–7.7)
Neutrophils Relative %: 69 %
Platelets: 272 10*3/uL (ref 150–400)
RBC: 5.68 MIL/uL — ABNORMAL HIGH (ref 3.87–5.11)
RDW: 12.7 % (ref 11.5–15.5)
WBC: 8.7 10*3/uL (ref 4.0–10.5)
nRBC: 0 % (ref 0.0–0.2)

## 2019-03-31 LAB — COMPREHENSIVE METABOLIC PANEL
ALT: 22 U/L (ref 0–44)
AST: 28 U/L (ref 15–41)
Albumin: 4.2 g/dL (ref 3.5–5.0)
Alkaline Phosphatase: 82 U/L (ref 38–126)
Anion gap: 11 (ref 5–15)
BUN: 7 mg/dL (ref 6–20)
CO2: 27 mmol/L (ref 22–32)
Calcium: 9.1 mg/dL (ref 8.9–10.3)
Chloride: 101 mmol/L (ref 98–111)
Creatinine, Ser: 0.89 mg/dL (ref 0.44–1.00)
GFR calc Af Amer: >60 mL/min (ref >60)
GFR calc non Af Amer: >60 mL/min (ref >60)
Glucose, Bld: 104 mg/dL — ABNORMAL HIGH (ref 70–99)
Potassium: 3.7 mmol/L (ref 3.5–5.1)
Sodium: 139 mmol/L (ref 135–145)
Total Bilirubin: 0.5 mg/dL (ref 0.3–1.2)
Total Protein: 8.1 g/dL (ref 6.5–8.1)

## 2019-03-31 LAB — POC SARS CORONAVIRUS 2 AG -  ED: SARS Coronavirus 2 Ag: NEGATIVE

## 2019-03-31 LAB — TROPONIN I (HIGH SENSITIVITY): Troponin I (High Sensitivity): 6 ng/L (ref <18)

## 2019-03-31 LAB — D-DIMER, QUANTITATIVE: D-Dimer, Quant: 0.38 ug/mL-FEU (ref 0.00–0.50)

## 2019-03-31 IMAGING — DX DG CHEST 1V PORT
1 series · 1 of 1 positions shown · non-contrast
Comparison: [DATE]

CLINICAL DATA: Shortness of breath. Cough.

EXAM:
PORTABLE CHEST 1 VIEW

[chest ap]
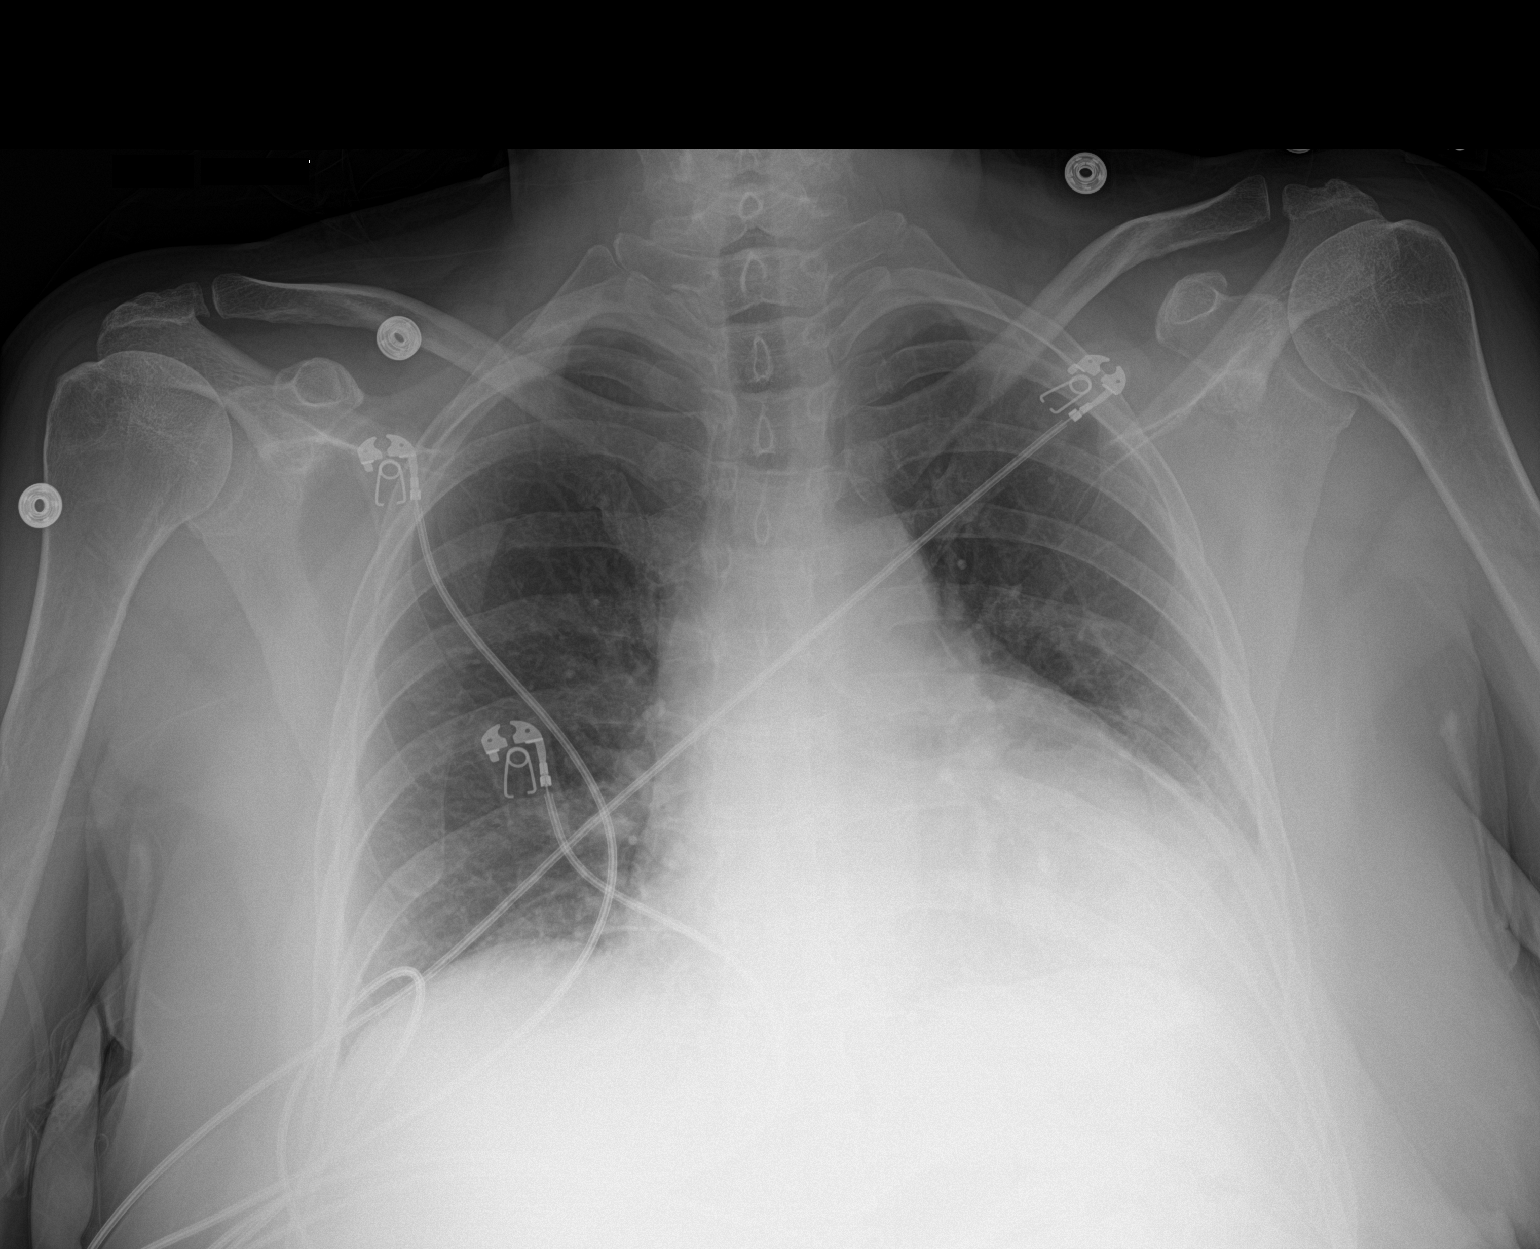

[1 of 1 positions shown; findings below may reference images not displayed]

FINDINGS: The heart size and pulmonary vascularity are normal. Lungs are
clear. Density at the left base laterally is felt to be due
prominent pericardial fat pad.

No significant bone abnormality.
IMPRESSION: No acute disease.

## 2019-03-31 MED ORDER — ONDANSETRON 4 MG PO TBDP: 4.0000 mg | ORAL_TABLET | Freq: Three times a day (TID) | ORAL | 0 refills | Status: DC | PRN

## 2019-03-31 MED ORDER — ONDANSETRON HCL 4 MG/2ML IJ SOLN
4.0000 mg | Freq: Once | INTRAMUSCULAR | Status: AC
  Administered 2019-03-31: 4 mg via INTRAVENOUS
  Filled 2019-03-31: qty 2

## 2019-03-31 MED ORDER — KETOROLAC TROMETHAMINE 15 MG/ML IJ SOLN
15.0000 mg | Freq: Once | INTRAMUSCULAR | Status: AC
  Administered 2019-03-31: 15 mg via INTRAVENOUS
  Filled 2019-03-31: qty 1

## 2019-03-31 MED ORDER — SODIUM CHLORIDE 0.9 % IV BOLUS
1000.0000 mL | Freq: Once | INTRAVENOUS | Status: AC
  Administered 2019-03-31: 1000 mL via INTRAVENOUS

## 2019-03-31 NOTE — ED Provider Notes (Signed)
[facility] DEPT Provider Note   CSN: [id number] Arrival date & time: 03/31/19  [id number]     History Chief Complaint  Patient presents with  . Abdominal Pain  . Fatigue  . Cough  . Headache    Kathleen Sweeney is a 59 y.o. female.  Presents to ER with chief complaint Covid-like symptoms.  Patient states for past 2 weeks she has been feeling generally ill.  Decreased energy, frequent chills but no fevers.  Has had some body aches, also having intermittent mild headaches.  Dull, frontal, not currently having headache.  Also feeling more short of breath than normal, intermittent chest pain.  Chest pain secondary to fibromyalgia chest pain, no change in chronic pain.  Has not taken medication for her symptoms.  Some nausea but no abdominal pain. No vomiting. Mild cough, nonproductive.  HPI     Past Medical History:  Diagnosis Date  . CKD (chronic kidney disease)   . Depression   . Facial droop   . Fibromyalgia   . Hyperlipidemia   . Hypertension   . Hypothyroid   . IBS (irritable bowel syndrome)   . Insomnia   . Lupus ([place])   . Migraine   . RLS (restless legs syndrome)   . Skin cancer Had cancer removed in 2000  . Thyroid disease     Patient Active Problem List   Diagnosis Date Noted  . Nausea & vomiting 04/27/2011  . Migraine   . Thyroid disease   . Fibromyalgia   . Depression     Past Surgical History:  Procedure Laterality Date  . ABDOMINAL HYSTERECTOMY  2008  . APPENDECTOMY  1998  . thoracic tumor  03/1995   excision     OB History   No obstetric history on file.     Family History  Problem Relation Age of Onset  . Sleep apnea Mother   . Irregular heart beat Mother        A fib  . Hypertension Mother   . Diabetes Mother   . Dementia Mother   . Pneumonia Father   . Sleep apnea Brother   . Dementia Maternal Grandmother     Social History   Tobacco Use  . Smoking status: Former Smoker    Packs/day: 2.00    Years:  30.00    Pack years: 60.00    Types: Cigarettes    Quit date: 06/23/2006    Years since quitting: 12.7  . Smokeless tobacco: Never Used  Substance Use Topics  . Alcohol use: No  . Drug use: Not Currently    Types: Marijuana, Other-see comments    Comment: CBD oil, last used 2020    Home Medications Prior to Admission medications   Medication Sig Start Date End Date Taking? Authorizing Provider  clonazePAM (KLONOPIN) 2 MG tablet TAKE 1/2 TO 1 TABLET BY MOUTH TWICE A DAY AS NEEDED 09/24/18   [provider]  doxepin (SINEQUAN) 10 MG capsule Take 10 mg by mouth. 1-2 at bedtime, do not take with trazodone    [provider]  estradiol (VIVELLE-DOT) 0.1 MG/24HR patch Place 1 patch onto the skin 2 (two) times a week.    [provider]  FLUoxetine (PROZAC) 20 MG capsule Take 20 mg by mouth daily. 03/30/19   [provider]  levothyroxine (SYNTHROID) 100 MCG tablet Take 100 mcg by mouth daily. 09/26/18   [provider]  omeprazole (PRILOSEC) 20 MG capsule Take 20 mg by mouth  daily.    [provider]  omeprazole (PRILOSEC) 40 MG capsule Take 40 mg by mouth daily. 10/23/18   [provider]  pravastatin (PRAVACHOL) 10 MG tablet Take 10 mg by mouth daily.    [provider]  pyridostigmine (MESTINON) 60 MG tablet Take 0.5-1 tablets (30-60 mg total) by mouth 3 (three) times daily. 03/02/19   Penumalli, Earlean Polka, MD  tiZANidine (ZANAFLEX) 4 MG tablet 4 mg. 09/15/18   [provider]  traZODone (DESYREL) 50 MG tablet Take 50 mg by mouth at bedtime. 1-3 at bedtime as needed    [provider]  zolpidem (AMBIEN CR) 12.5 MG CR tablet Take 12.5 mg by mouth at bedtime as needed for sleep. 03/11/19   [provider]    Allergies    Erythromycin, Morphine and related, Penicillins, Promethazine, and Simvastatin  Review of Systems   Review of Systems  Constitutional: Positive for chills and fatigue. Negative for  fever.  HENT: Negative for ear pain and sore throat.   Eyes: Negative for pain and visual disturbance.  Respiratory: Positive for cough. Negative for shortness of breath.   Cardiovascular: Negative for chest pain and palpitations.  Gastrointestinal: Positive for nausea. Negative for abdominal pain and vomiting.  Genitourinary: Negative for dysuria and hematuria.  Musculoskeletal: Negative for arthralgias and back pain.  Skin: Negative for color change and rash.  Neurological: Negative for seizures and syncope.  All other systems reviewed and are negative.   Physical Exam Updated Vital Signs BP (!) 144/59   Pulse 85   Temp 98.2 F (36.8 C) (Oral)   Resp (!) 23   SpO2 97%   Physical Exam Vitals and nursing note reviewed.  Constitutional:      General: She is not in acute distress.    Appearance: She is well-developed.  HENT:     Head: Normocephalic and atraumatic.  Eyes:     Conjunctiva/sclera: Conjunctivae normal.  Cardiovascular:     Rate and Rhythm: Normal rate and regular rhythm.     Heart sounds: No murmur.  Pulmonary:     Effort: Pulmonary effort is normal. No respiratory distress.     Breath sounds: Normal breath sounds.  Abdominal:     General: Bowel sounds are normal.     Palpations: Abdomen is soft.     Tenderness: There is no abdominal tenderness.  Musculoskeletal:     Cervical back: Neck supple.  Skin:    General: Skin is warm and dry.     Capillary Refill: Capillary refill takes less than 2 seconds.  Neurological:     Mental Status: She is alert.     ED Results / Procedures / Treatments   Labs (all labs ordered are listed, but only abnormal results are displayed) Labs Reviewed  CBC WITH DIFFERENTIAL/PLATELET - Abnormal; Notable for the following components:      Result Value   RBC 5.68 (*)    Hemoglobin 15.8 (*)    HCT 47.1 (*)    All other components within normal limits  COMPREHENSIVE METABOLIC PANEL - Abnormal; Notable for the following  components:   Glucose, Bld 104 (*)    All other components within normal limits  NOVEL CORONAVIRUS, NAA (HOSP ORDER, SEND-OUT TO REF LAB; TAT 18-24 HRS)  D-DIMER, QUANTITATIVE (NOT AT [facility])  POC SARS CORONAVIRUS 2 AG -  ED  TROPONIN I (HIGH SENSITIVITY)    EKG EKG Interpretation  Date/Time:  Tuesday March 31 2019 08:32:09 EST Ventricular Rate:  98  PR Interval:    QRS Duration: 84 QT Interval:  370 QTC Calculation: 473 R Axis:   -22 Text Interpretation: Sinus rhythm Borderline left axis deviation Confirmed by Madalyn Rob [phone]) on 03/31/2019 10:01:53 AM   Radiology DG Chest Portable 1 View  Result Date: 03/31/2019 CLINICAL DATA:  Shortness of breath. Cough. EXAM: PORTABLE CHEST 1 VIEW COMPARISON:  01/01/2018 FINDINGS: The heart size and pulmonary vascularity are normal. Lungs are clear. Density at the left base laterally is felt to be due prominent pericardial fat pad. No significant bone abnormality. IMPRESSION: No acute disease. Electronically Signed   By: Lorriane Shire M.D.   On: 03/31/2019 09:53    Procedures Procedures (including critical care time)  Medications Ordered in ED Medications  ketorolac (TORADOL) 15 MG/ML injection 15 mg (15 mg Intravenous Given 03/31/19 0933)  ondansetron (ZOFRAN) injection 4 mg (4 mg Intravenous Given 03/31/19 0933)  sodium chloride 0.9 % bolus 1,000 mL (0 mLs Intravenous Stopped 03/31/19 1052)    ED Course  I have reviewed the triage vital signs and the nursing notes.  Pertinent labs & imaging results that were available during my care of the patient were reviewed by me and considered in my medical decision making (see chart for details).  Clinical Course as of Mar 31 1111  Tue Mar 31, 2019  1112 Rechecked, symptoms improved, discussed results, will discharge   [RD]    Clinical Course User Index [RD] Lucrezia Starch, MD   MDM Rules/Calculators/A&P                      58 year old lady with multiple complaints  concerning for viral illness.  She is well-appearing, stable vital signs.  Given reported chest pain and some shortness of breath, check EKG, troponin, dimer.  No acute changes on EKG, troponin within normal limits, dimer within normal limits.  CXR negative for pneumonia.  Rapid Covid negative.  Will send PCR.  At this time believe she is appropriate for outpatient management and discharge home.  Reviewed return precautions, recommended follow-up with primary doctor.    After the discussed management above, the patient was determined to be safe for discharge.  The patient was in agreement with this plan and all questions regarding their care were answered.  ED return precautions were discussed and the patient will return to the ED with any significant worsening of condition.   Final Clinical Impression(s) / ED Diagnoses Final diagnoses:  Flu-like symptoms  Viral syndrome  Suspected COVID-19 virus infection    Rx / DC Orders ED Discharge Orders    None       Lucrezia Starch, MD 03/31/19 1117

## 2019-03-31 NOTE — ED Triage Notes (Signed)
Pt reports she wanting to be tested for Covid bc last week was "having all the symptoms except fevers".  Reports been falling a lot of there the past month. Unsure if that is related to her eye problems and or neurology problem.

## 2019-03-31 NOTE — Discharge Instructions (Signed)
Recommend follow-up with primary doctor regarding symptoms you are experiencing today.  Please follow isolation precautions until we have the results of your PCR test.  Hopefully this will come back tomorrow.  Return to ER if you develop any difficulty in breathing, fever, abdominal pain or other new concerning symptom.

## 2019-04-07 LAB — NOVEL CORONAVIRUS, NAA (HOSP ORDER, SEND-OUT TO REF LAB; TAT 18-24 HRS): SARS-CoV-2, NAA: NOT DETECTED

## 2019-04-28 DIAGNOSIS — R05 Cough: Secondary | ICD-10-CM | POA: Diagnosis not present

## 2019-04-28 DIAGNOSIS — N3001 Acute cystitis with hematuria: Secondary | ICD-10-CM | POA: Diagnosis not present

## 2019-05-21 DIAGNOSIS — E538 Deficiency of other specified B group vitamins: Secondary | ICD-10-CM | POA: Diagnosis not present

## 2019-05-21 DIAGNOSIS — R7303 Prediabetes: Secondary | ICD-10-CM | POA: Diagnosis not present

## 2019-05-21 DIAGNOSIS — E785 Hyperlipidemia, unspecified: Secondary | ICD-10-CM | POA: Diagnosis not present

## 2019-05-21 DIAGNOSIS — Z5181 Encounter for therapeutic drug level monitoring: Secondary | ICD-10-CM | POA: Diagnosis not present

## 2019-05-21 DIAGNOSIS — E039 Hypothyroidism, unspecified: Secondary | ICD-10-CM | POA: Diagnosis not present

## 2019-05-21 DIAGNOSIS — E611 Iron deficiency: Secondary | ICD-10-CM | POA: Diagnosis not present

## 2019-06-01 ENCOUNTER — Other Ambulatory Visit: Payer: Self-pay

## 2019-06-01 ENCOUNTER — Encounter: Payer: Self-pay | Admitting: Diagnostic Neuroimaging

## 2019-06-01 ENCOUNTER — Ambulatory Visit: Payer: BC Managed Care – PPO | Admitting: Diagnostic Neuroimaging

## 2019-06-01 VITALS — BP 133/85 | HR 83 | Temp 97.0°F | Ht 68.0 in | Wt 180.0 lb

## 2019-06-01 DIAGNOSIS — H02403 Unspecified ptosis of bilateral eyelids: Secondary | ICD-10-CM

## 2019-06-01 DIAGNOSIS — R531 Weakness: Secondary | ICD-10-CM | POA: Diagnosis not present

## 2019-06-01 MED ORDER — PYRIDOSTIGMINE BROMIDE 60 MG PO TABS: 30.0000 mg | ORAL_TABLET | Freq: Three times a day (TID) | ORAL | 6 refills | Status: DC

## 2019-06-01 NOTE — Patient Instructions (Signed)
BILATERAL PTOSIS, WEAKNESS, DYSARTHRIA, DYSPHAGIA (possible sero-negative myasthenia gravis) - continue pyridostigmine 30mg  three times a day - check EMG/NCS (nerve testing) with repetitive nerve stimulation  INSOMNIA / DEPRESSION / ANXIETY  - follow up with Dr. Toy Care

## 2019-06-01 NOTE — Progress Notes (Signed)
[facility]  PATIENT: Kathleen Sweeney DOB: 04-14-1960  REFERRING CLINICIAN: Maurice Small, MD HISTORY FROM: patient  REASON FOR VISIT: follow up   HISTORICAL  CHIEF COMPLAINT:  Chief Complaint  Patient presents with  . Ptosis, weakness    rm 6, 3 month FU, husband- Araceli Bouche "my eyes get tired, fatigue easily; no falls since Jan but not stable on my feet; I have severe cramps, occasional diarrhea, am eating with medicine"    HISTORY OF PRESENT ILLNESS:   UPDATE (06/01/19, VRP): Since last visit, doing better on Mestinon 30 mg 3 times a day.  Ptosis, speech and general weakness are improved.  ACH R antibody and anti-MuSK antibody testing performed and were negative.  Patient continues to struggle with insomnia, depression and anxiety.  She is seeing Dr Toy Care from psychiatry for these issues.  Patient also has some unexplained pain issues, concerned she may have fibromyalgia. Also having memory loss issues concerned about dementia.   PRIOR HPI: 59 year old female here for evaluation of ptosis.  Patient has had at least 2 years of gradual onset progressive drooping eyelids, left worse than right.  Patient went to eye doctor for routine checkup and was complaining of ptosis.  She was referred to another eye doctor who raised possibility of myasthenia gravis.  Patient referred here for further evaluation.  Patient also been having lower extremity weakness, left worse than right.  She is also having some slurred speech and trouble swallowing.  No specific triggering or aggravating factors.  Symptoms slightly fluctuate.  She has some blurred vision as well.  Sometimes her ptosis obstructs her vision.  No shortness of breath.    REVIEW OF SYSTEMS: Full 14 system review of systems performed and negative with exception of: As per HPI.  ALLERGIES: Allergies  Allergen Reactions  . Erythromycin Anaphylaxis    Chest pain  . Morphine And Related Nausea And Vomiting    Not given   . Penicillins Other (See Comments)    Family allergy.-- sister had an allergic reaction and the doctor told her that the whole family would have it. Never actually taken medication.  Has patient had a PCN reaction causing immediate rash, facial/tongue/throat swelling, SOB or lightheadedness with hypotension: No answer  Has patient had a PCN reaction causing severe rash involving mucus membranes or skin necrosis:No answer  Has patient had a PCN reaction that required hospitalization no answer  Has patient had a PCN reaction occurring wit  . Promethazine     "made her sick"  . Simvastatin Other (See Comments)    Muscle aches    HOME MEDICATIONS: Outpatient Medications Prior to Visit  Medication Sig Dispense Refill  . doxepin (SINEQUAN) 10 MG capsule Take 10 mg by mouth. 1-2 at bedtime, do not take with trazodone    . levothyroxine (SYNTHROID) 100 MCG tablet Take 100 mcg by mouth daily.    Marland Kitchen LORazepam (ATIVAN) 1 MG tablet Take 1 mg by mouth 3 (three) times daily as needed.    Marland Kitchen omeprazole (PRILOSEC) 20 MG capsule Take 20 mg by mouth daily.    . ondansetron (ZOFRAN ODT) 4 MG disintegrating tablet Take 1 tablet (4 mg total) by mouth every 8 (eight) hours as needed for nausea or vomiting. 20 tablet 0  . pravastatin (PRAVACHOL) 10 MG tablet Take 10 mg by mouth daily.    Marland Kitchen pyridostigmine (MESTINON) 60 MG tablet Take 0.5-1 tablets (30-60 mg total) by mouth 3 (three) times daily. 30 tablet 6  .  tiZANidine (ZANAFLEX) 4 MG tablet 4 mg.    . traZODone (DESYREL) 50 MG tablet Take 50 mg by mouth at bedtime. 1-3 at bedtime as needed    . zolpidem (AMBIEN CR) 12.5 MG CR tablet Take 12.5 mg by mouth at bedtime as needed for sleep.    . clonazePAM (KLONOPIN) 2 MG tablet TAKE 1/2 TO 1 TABLET BY MOUTH TWICE A DAY AS NEEDED    . estradiol (VIVELLE-DOT) 0.1 MG/24HR patch Place 1 patch onto the skin 2 (two) times a week.    Marland Kitchen FLUoxetine (PROZAC) 20 MG capsule Take 20 mg by mouth daily.    Marland Kitchen omeprazole  (PRILOSEC) 40 MG capsule Take 40 mg by mouth daily.     No facility-administered medications prior to visit.    PAST MEDICAL HISTORY: Past Medical History:  Diagnosis Date  . CKD (chronic kidney disease)   . Depression   . Facial droop   . Fibromyalgia   . Hyperlipidemia   . Hypertension   . Hypothyroid   . IBS (irritable bowel syndrome)   . Insomnia   . Lupus ([place])   . Migraine   . RLS (restless legs syndrome)   . Skin cancer Had cancer removed in 2000  . Thyroid disease     PAST SURGICAL HISTORY: Past Surgical History:  Procedure Laterality Date  . ABDOMINAL HYSTERECTOMY  2008  . APPENDECTOMY  1998  . thoracic tumor  03/1995   excision    FAMILY HISTORY: Family History  Problem Relation Age of Onset  . Sleep apnea Mother   . Irregular heart beat Mother        A fib  . Hypertension Mother   . Diabetes Mother   . Dementia Mother   . Pneumonia Father   . Sleep apnea Brother   . Dementia Maternal Grandmother     SOCIAL HISTORY: Social History   Socioeconomic History  . Marital status: Married    Spouse name: Araceli Bouche  . Number of children: 2  . Years of education: Not on file  . Highest education level: High school graduate  Occupational History    Comment: na  Tobacco Use  . Smoking status: Former Smoker    Packs/day: 2.00    Years: 30.00    Pack years: 60.00    Types: Cigarettes    Quit date: 06/23/2006    Years since quitting: 12.9  . Smokeless tobacco: Never Used  Substance and Sexual Activity  . Alcohol use: No  . Drug use: Not Currently    Types: Marijuana, Other-see comments    Comment: CBD oil, last used 2020  . Sexual activity: Never  Other Topics Concern  . Not on file  Social History Narrative   Lives with spouse   Caffeine-tea 3-4 weekly   Social Determinants of Health   Financial Resource Strain:   . Difficulty of Paying Living Expenses:   Food Insecurity:   . Worried About Charity fundraiser in the Last Year:   . Youth worker in the Last Year:   Transportation Needs:   . Film/video editor (Medical):   Marland Kitchen Lack of Transportation (Non-Medical):   Physical Activity:   . Days of Exercise per Week:   . Minutes of Exercise per Session:   Stress:   . Feeling of Stress :   Social Connections:   . Frequency of Communication with Friends and Family:   . Frequency of Social Gatherings with Friends and Family:   .  Attends Religious Services:   . Active Member of Clubs or Organizations:   . Attends Archivist Meetings:   Marland Kitchen Marital Status:   Intimate Partner Violence:   . Fear of Current or Ex-Partner:   . Emotionally Abused:   Marland Kitchen Physically Abused:   . Sexually Abused:      PHYSICAL EXAM  GENERAL EXAM/CONSTITUTIONAL: Vitals:  Vitals:   06/01/19 1310  BP: 133/85  Pulse: 83  Temp: (!) 97 F (36.1 C)  Weight: 180 lb (81.6 kg)  Height: 5\' 8"  (1.727 m)   Body mass index is 27.37 kg/m. Wt Readings from Last 3 Encounters:  06/01/19 180 lb (81.6 kg)  03/02/19 181 lb 9.6 oz (82.4 kg)  01/22/18 182 lb 12.8 oz (82.9 kg)    Patient is in no distress; well developed, nourished and groomed; neck is supple  TEARFUL  CARDIOVASCULAR:  Examination of carotid arteries is normal; no carotid bruits  Regular rate and rhythm, no murmurs  Examination of peripheral vascular system by observation and palpation is normal  EYES:  Ophthalmoscopic exam of optic discs and posterior segments is normal; no papilledema or hemorrhages No exam data present  MUSCULOSKELETAL:  Gait, strength, tone, movements noted in Neurologic exam below  NEUROLOGIC: MENTAL STATUS:  No flowsheet data found.  awake, alert, oriented to person, place and time  recent and remote memory intact  normal attention and concentration  language fluent, comprehension intact, naming intact  fund of knowledge appropriate  CRANIAL NERVE:   2nd - no papilledema on fundoscopic exam  2nd, 3rd, 4th, 6th - pupils equal  and reactive to light, visual fields full to confrontation, extraocular muscles intact, no nystagmus; NO PTOSIS  5th - facial sensation symmetric  7th - facial strength symmetric  8th - hearing intact  9th - palate elevates symmetrically, uvula midline  11th - shoulder shrug symmetric  12th - tongue protrusion midline  MOTOR:   normal bulk and tone, full strength in the BUE, BLE  SENSORY:   normal and symmetric to light touch  COORDINATION:   finger-nose-finger, fine finger movements normal  REFLEXES:   deep tendon reflexes present and symmetric  GAIT/STATION:   narrow based gait; CAUTIOUS UNSTEADY GAIT     DIAGNOSTIC DATA (LABS, IMAGING, TESTING) - I reviewed patient records, labs, notes, testing and imaging myself where available.  Lab Results  Component Value Date   WBC 8.7 03/31/2019   HGB 15.8 (H) 03/31/2019   HCT 47.1 (H) 03/31/2019   MCV 82.9 03/31/2019   PLT 272 03/31/2019      Component Value Date/Time   NA 139 03/31/2019 0930   NA 138 03/02/2019 1200   K 3.7 03/31/2019 0930   CL 101 03/31/2019 0930   CO2 27 03/31/2019 0930   GLUCOSE 104 (H) 03/31/2019 0930   BUN 7 03/31/2019 0930   BUN 10 03/02/2019 1200   CREATININE 0.89 03/31/2019 0930   CALCIUM 9.1 03/31/2019 0930   PROT 8.1 03/31/2019 0930   PROT 7.1 03/02/2019 1200   ALBUMIN 4.2 03/31/2019 0930   ALBUMIN 4.2 03/02/2019 1200   AST 28 03/31/2019 0930   ALT 22 03/31/2019 0930   ALKPHOS 82 03/31/2019 0930   BILITOT 0.5 03/31/2019 0930   BILITOT 0.4 03/02/2019 1200   GFRNONAA >60 03/31/2019 0930   GFRAA >60 03/31/2019 0930   No results found for: CHOL, HDL, LDLCALC, LDLDIRECT, TRIG, CHOLHDL No results found for: HGBA1C No results found for: [id number] Lab Results  Component Value Date  TSH 0.248 (L) 03/02/2019    AchR ab - negative  Anti-musk ab - negative   ASSESSMENT AND PLAN  59 y.o. year old female here with gradual onset progressive ptosis, weakness of lower  extremities, slurred speech and trouble swallowing since 2018.  Signs and symptoms concerning for myasthenia gravis, but antibody testing was negative.   Dx:  1. Weakness   2. Ptosis of both eyelids      PLAN:  BILATERAL PTOSIS, WEAKNESS, DYSARTHRIA, DYSPHAGIA (possible sero-negative myasthenia gravis) - continue pyridostigmine 30mg  three times a day - check EMG/NCS with repetitive nerve stimulation - check addl lab testing  INSOMNIA / DEPRESSION / ANXIETY  - follow up with Dr. Toy Care  Orders Placed This Encounter  Procedures  . VGCC Antibody  . Glutamic acid decarboxylase auto abs  . NCV with EMG(electromyography)   Meds ordered this encounter  Medications  . pyridostigmine (MESTINON) 60 MG tablet    Sig: Take 0.5-1 tablets (30-60 mg total) by mouth 3 (three) times daily.    Dispense:  30 tablet    Refill:  6   Return in about 6 months (around 12/01/2019).    [name], MD [id number], [id number] PM Certified in Neurology, Neurophysiology and Neuroimaging  [facility] [address] [place], [place] [address] [phone]

## 2019-06-05 LAB — VGCC ANTIBODY: VGCC Antibody: NEGATIVE

## 2019-06-05 LAB — GLUTAMIC ACID DECARBOXYLASE AUTO ABS: Glutamic Acid Decarb Ab: <5 U/mL (ref 0.0–5.0)

## 2019-06-08 ENCOUNTER — Telehealth: Payer: Self-pay | Admitting: *Deleted

## 2019-06-08 NOTE — Telephone Encounter (Signed)
spoke with patient and informed her that her lab results are unremarkable. Patient verbalized understanding, appreciation.

## 2019-06-19 DIAGNOSIS — F411 Generalized anxiety disorder: Secondary | ICD-10-CM | POA: Diagnosis not present

## 2019-06-19 DIAGNOSIS — G4709 Other insomnia: Secondary | ICD-10-CM | POA: Diagnosis not present

## 2019-06-21 ENCOUNTER — Emergency Department (HOSPITAL_COMMUNITY)
Admission: EM | Admit: 2019-06-21 | Discharge: 2019-06-22 | Disposition: A | Discharge status: Home / Self Care | Payer: BC Managed Care – PPO | Attending: Emergency Medicine | Admitting: Emergency Medicine

## 2019-06-21 ENCOUNTER — Encounter (HOSPITAL_COMMUNITY): Payer: Self-pay | Admitting: Emergency Medicine

## 2019-06-21 ENCOUNTER — Other Ambulatory Visit: Payer: Self-pay

## 2019-06-21 DIAGNOSIS — Z79899 Other long term (current) drug therapy: Secondary | ICD-10-CM | POA: Insufficient documentation

## 2019-06-21 DIAGNOSIS — K5732 Diverticulitis of large intestine without perforation or abscess without bleeding: Secondary | ICD-10-CM | POA: Diagnosis not present

## 2019-06-21 DIAGNOSIS — R1032 Left lower quadrant pain: Secondary | ICD-10-CM

## 2019-06-21 DIAGNOSIS — I129 Hypertensive chronic kidney disease with stage 1 through stage 4 chronic kidney disease, or unspecified chronic kidney disease: Secondary | ICD-10-CM | POA: Diagnosis not present

## 2019-06-21 DIAGNOSIS — Z87891 Personal history of nicotine dependence: Secondary | ICD-10-CM | POA: Insufficient documentation

## 2019-06-21 DIAGNOSIS — N189 Chronic kidney disease, unspecified: Secondary | ICD-10-CM | POA: Diagnosis not present

## 2019-06-21 DIAGNOSIS — E039 Hypothyroidism, unspecified: Secondary | ICD-10-CM | POA: Diagnosis not present

## 2019-06-21 DIAGNOSIS — Z85828 Personal history of other malignant neoplasm of skin: Secondary | ICD-10-CM | POA: Insufficient documentation

## 2019-06-21 MED ORDER — SODIUM CHLORIDE 0.9% FLUSH: 3.0000 mL | Freq: Once | INTRAVENOUS | Status: DC

## 2019-06-21 NOTE — ED Triage Notes (Signed)
Pt c/o abd pain for the past few days with nausea and vomiting getting worse tonight.

## 2019-06-21 NOTE — ED Notes (Signed)
VS DONE

## 2019-06-22 ENCOUNTER — Emergency Department (HOSPITAL_COMMUNITY): Payer: BC Managed Care – PPO

## 2019-06-22 DIAGNOSIS — R1110 Vomiting, unspecified: Secondary | ICD-10-CM | POA: Diagnosis not present

## 2019-06-22 DIAGNOSIS — R1032 Left lower quadrant pain: Secondary | ICD-10-CM | POA: Diagnosis not present

## 2019-06-22 DIAGNOSIS — K5732 Diverticulitis of large intestine without perforation or abscess without bleeding: Secondary | ICD-10-CM | POA: Diagnosis not present

## 2019-06-22 DIAGNOSIS — R112 Nausea with vomiting, unspecified: Secondary | ICD-10-CM | POA: Diagnosis not present

## 2019-06-22 LAB — COMPREHENSIVE METABOLIC PANEL
ALT: 10 U/L (ref 0–44)
AST: 16 U/L (ref 15–41)
Albumin: 3.4 g/dL — ABNORMAL LOW (ref 3.5–5.0)
Alkaline Phosphatase: 69 U/L (ref 38–126)
Anion gap: 14 (ref 5–15)
BUN: 14 mg/dL (ref 8–23)
CO2: 22 mmol/L (ref 22–32)
Calcium: 8.8 mg/dL — ABNORMAL LOW (ref 8.9–10.3)
Chloride: 100 mmol/L (ref 98–111)
Creatinine, Ser: 1.27 mg/dL — ABNORMAL HIGH (ref 0.44–1.00)
GFR calc Af Amer: 53 mL/min — ABNORMAL LOW (ref >60)
GFR calc non Af Amer: 46 mL/min — ABNORMAL LOW (ref >60)
Glucose, Bld: 159 mg/dL — ABNORMAL HIGH (ref 70–99)
Potassium: 4.1 mmol/L (ref 3.5–5.1)
Sodium: 136 mmol/L (ref 135–145)
Total Bilirubin: 0.4 mg/dL (ref 0.3–1.2)
Total Protein: 7 g/dL (ref 6.5–8.1)

## 2019-06-22 LAB — URINALYSIS, ROUTINE W REFLEX MICROSCOPIC
Bilirubin Urine: NEGATIVE
Glucose, UA: NEGATIVE mg/dL
Hgb urine dipstick: NEGATIVE
Ketones, ur: NEGATIVE mg/dL
Leukocytes,Ua: NEGATIVE
Nitrite: NEGATIVE
Protein, ur: NEGATIVE mg/dL
Specific Gravity, Urine: 1.011 (ref 1.005–1.030)
pH: 5 (ref 5.0–8.0)

## 2019-06-22 LAB — CBC
HCT: 43.4 % (ref 36.0–46.0)
Hemoglobin: 13.5 g/dL (ref 12.0–15.0)
MCH: 27.4 pg (ref 26.0–34.0)
MCHC: 31.1 g/dL (ref 30.0–36.0)
MCV: 88.2 fL (ref 80.0–100.0)
Platelets: 234 10*3/uL (ref 150–400)
RBC: 4.92 MIL/uL (ref 3.87–5.11)
RDW: 12.6 % (ref 11.5–15.5)
WBC: 10.8 10*3/uL — ABNORMAL HIGH (ref 4.0–10.5)
nRBC: 0 % (ref 0.0–0.2)

## 2019-06-22 LAB — LIPASE, BLOOD: Lipase: 26 U/L (ref 11–51)

## 2019-06-22 IMAGING — CT CT ABD-PELV W/ CM
2 of 5 series · 16 of 46 positions shown, 18 images · IV contrast (Omni 300)
Comparison: CT abdomen pelvis dated [DATE].

CLINICAL DATA: Abdominal pain with nausea and vomiting.

EXAM:
CT ABDOMEN AND PELVIS WITH CONTRAST
TECHNIQUE: Multidetector CT imaging of the abdomen and pelvis was performed
using the standard protocol following bolus administration of
intravenous contrast.
CONTRAST:  100mL OMNIPAQUE IOHEXOL 300 MG/ML  SOLN

[Series 3: a/p w/ 5mm · axial · 0.96mm/px · z∈[+857,+1277]mm · 13 of 94 slices shown, 15 images]
[im 5/94  soft-tissue]
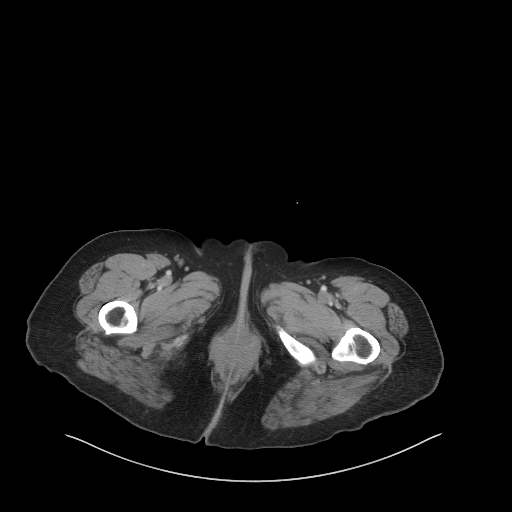
[im 5/94  bone]
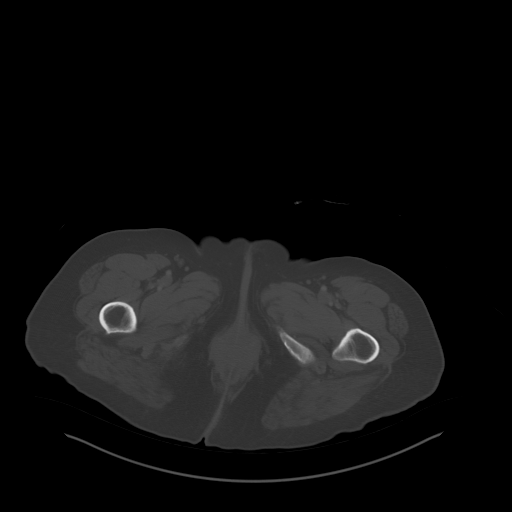
[im 14/94  soft-tissue]
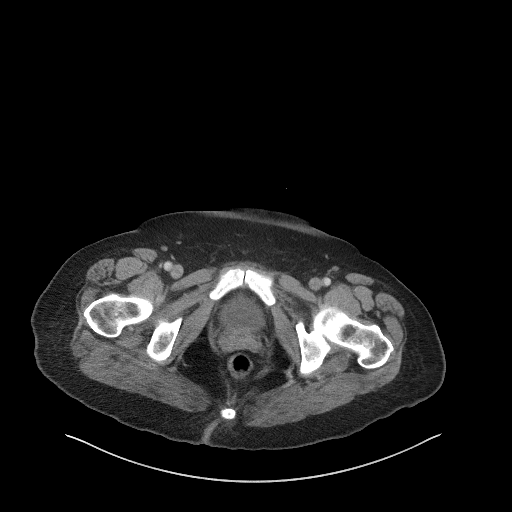
[im 18/94  soft-tissue]
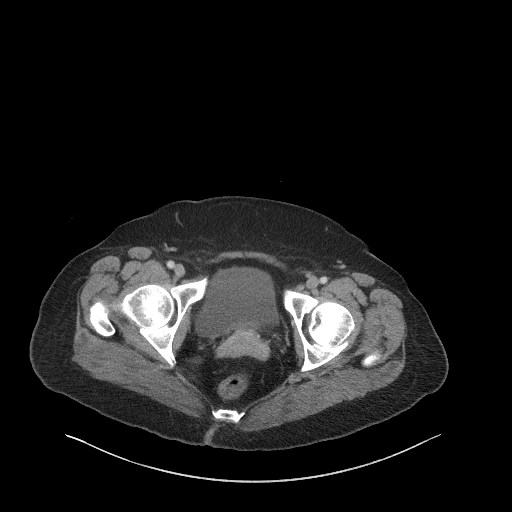
[im 27/94  soft-tissue]
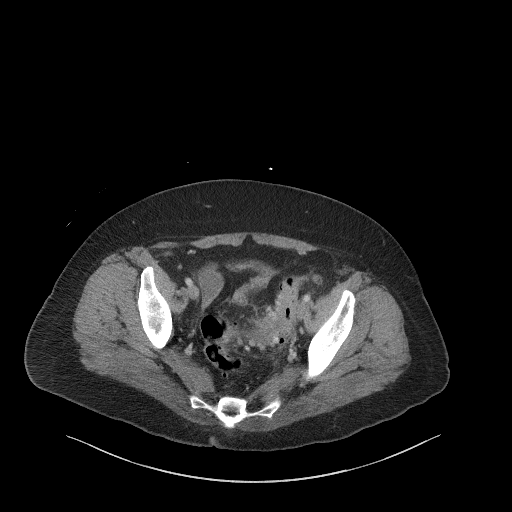
[im 32/94  soft-tissue]
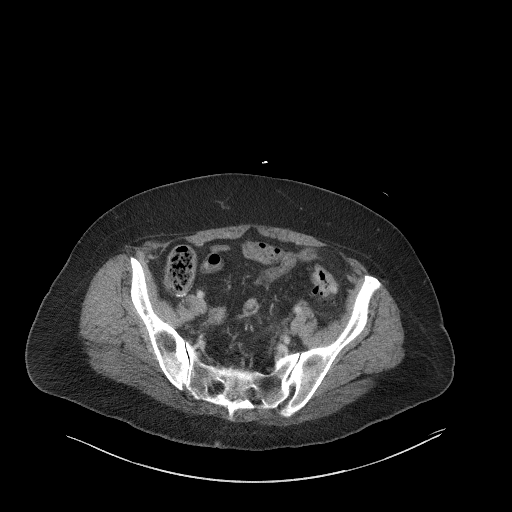
[im 40/94  soft-tissue]
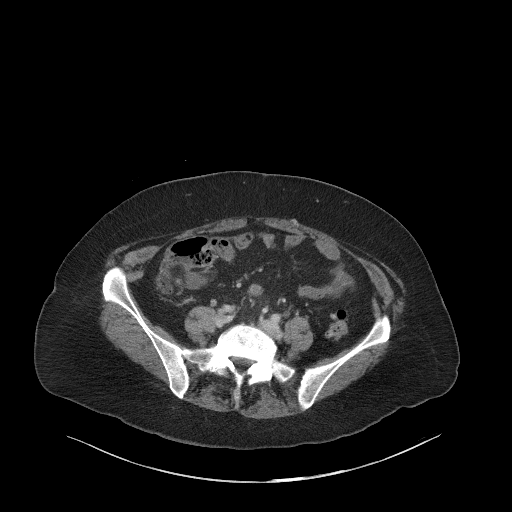
[im 49/94  soft-tissue]
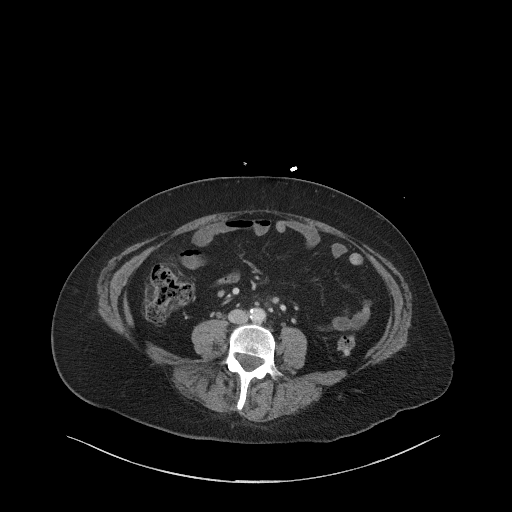
[im 54/94  soft-tissue]
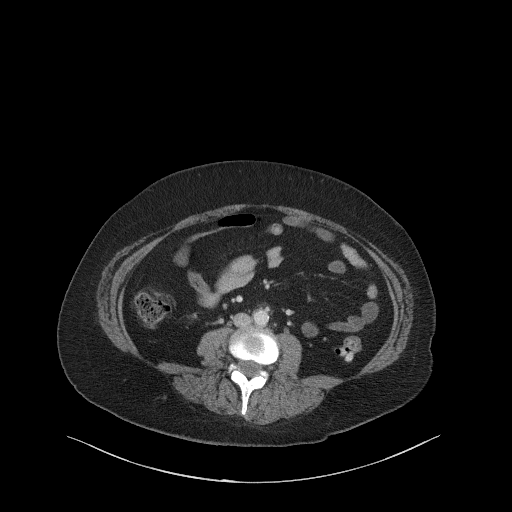
[im 63/94  soft-tissue]
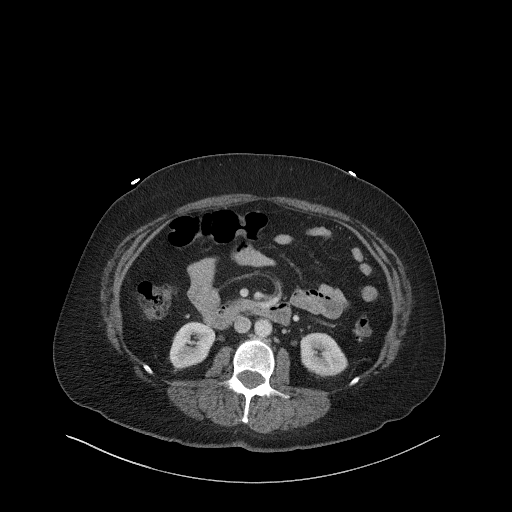
[im 63/94  bone]
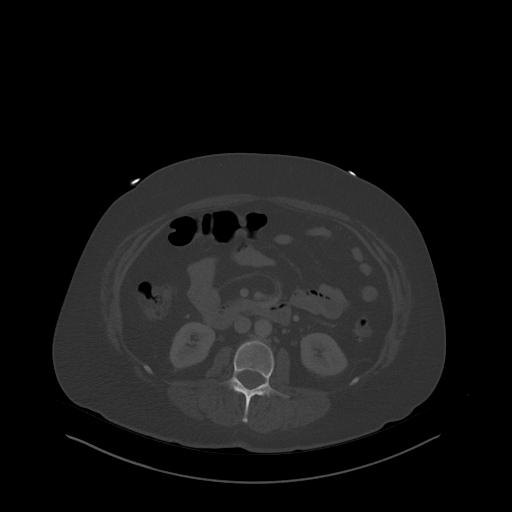
[im 67/94  soft-tissue]
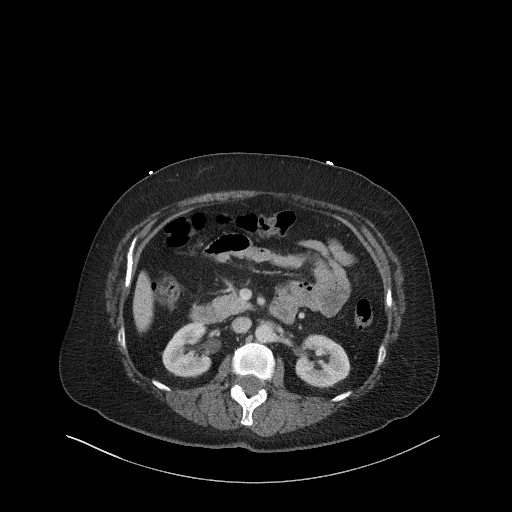
[im 76/94  soft-tissue]
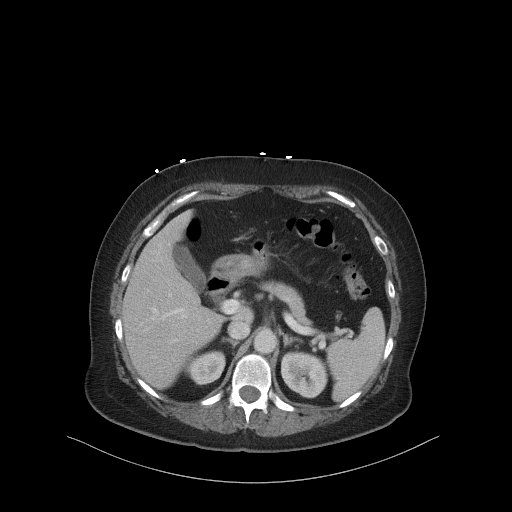
[im 80/94  soft-tissue]
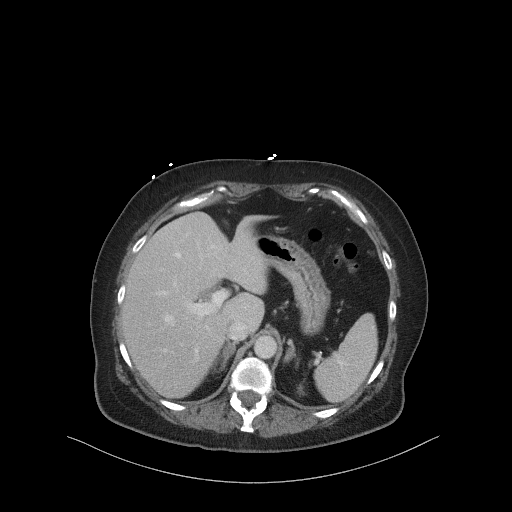
[im 89/94  soft-tissue]
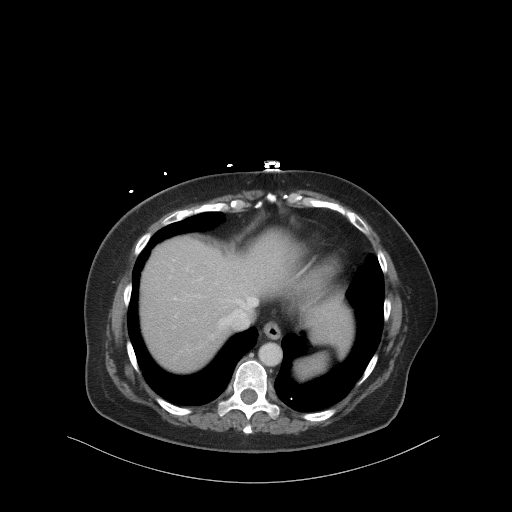

[Series 6: a/p w/ cor · coronal · 0.77mm/px · 3 of 131 slices shown]
[im 44/131  soft-tissue]
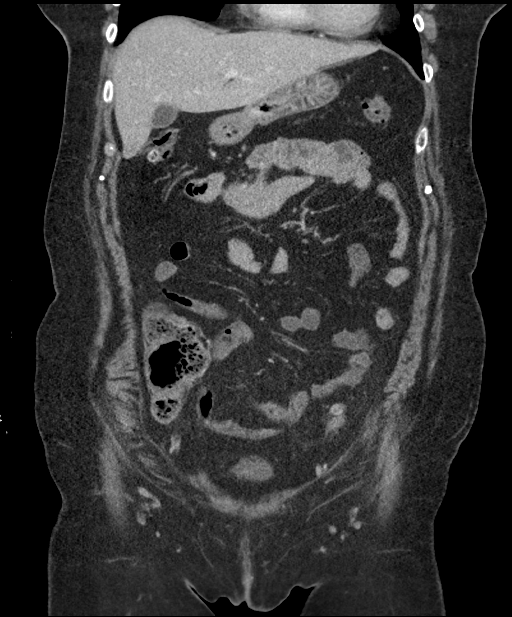
[im 58/131  soft-tissue]
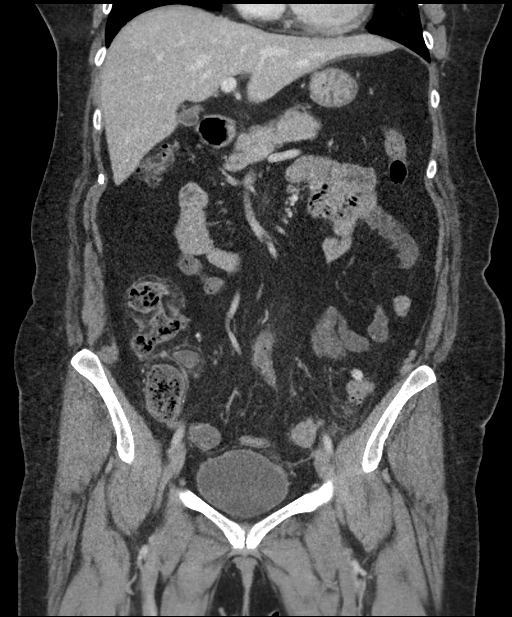
[im 73/131  soft-tissue]
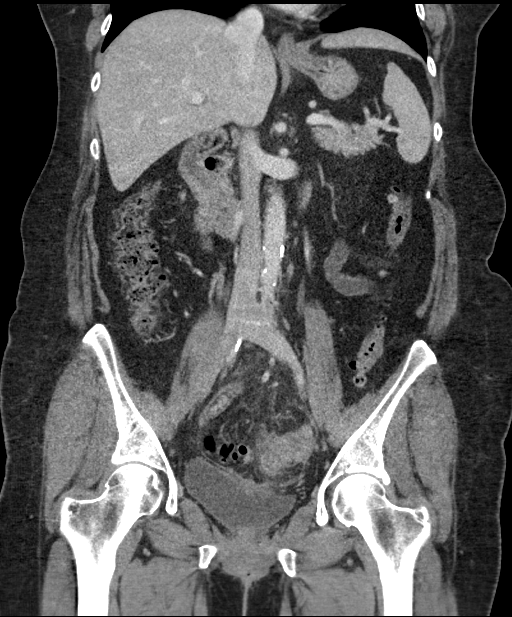

[16 of 46 positions shown; findings below may reference images not displayed]

FINDINGS: Lower chest: No acute abnormality.

Hepatobiliary: No focal liver abnormality is seen. No gallstones,
gallbladder wall thickening, or biliary dilatation.

Pancreas: Unremarkable. No pancreatic ductal dilatation or
surrounding inflammatory changes.

Spleen: Normal in size without focal abnormality.

Adrenals/Urinary Tract: Adrenal glands are unremarkable. Kidneys are
normal, without renal calculi, focal lesion, or hydronephrosis.
Bladder is unremarkable.

Stomach/Bowel: The stomach is within normal limits. Extensive
left-sided colonic diverticulosis with focal short segment wall
thickening of the sigmoid colon adjacent to an inflamed
diverticulum, consistent with acute diverticulitis. No extraluminal
air or fluid collection. The small bowel is unremarkable. No
obstruction. Prior appendectomy.

Vascular/Lymphatic: Aortic atherosclerosis. No enlarged abdominal or
pelvic lymph nodes.

Reproductive: Status post hysterectomy. No adnexal masses.

Other: Trace free fluid pelvis. No pneumoperitoneum.

Musculoskeletal: No acute or significant osseous findings.
IMPRESSION: 1. Acute sigmoid diverticulitis. No perforation or abscess.

## 2019-06-22 MED ORDER — FENTANYL CITRATE (PF) 100 MCG/2ML IJ SOLN
50.0000 ug | Freq: Once | INTRAMUSCULAR | Status: AC
  Administered 2019-06-22: 50 ug via INTRAVENOUS
  Filled 2019-06-22: qty 2

## 2019-06-22 MED ORDER — IOHEXOL 300 MG/ML  SOLN
100.0000 mL | Freq: Once | INTRAMUSCULAR | Status: AC | PRN
  Administered 2019-06-22: 100 mL via INTRAVENOUS

## 2019-06-22 MED ORDER — METRONIDAZOLE 500 MG PO TABS: 500.0000 mg | ORAL_TABLET | Freq: Three times a day (TID) | ORAL | 0 refills | Status: DC

## 2019-06-22 MED ORDER — ONDANSETRON HCL 4 MG/2ML IJ SOLN
4.0000 mg | Freq: Once | INTRAMUSCULAR | Status: AC
  Administered 2019-06-22: 4 mg via INTRAVENOUS
  Filled 2019-06-22: qty 2

## 2019-06-22 MED ORDER — CIPROFLOXACIN HCL 500 MG PO TABS: 500.0000 mg | ORAL_TABLET | Freq: Two times a day (BID) | ORAL | 0 refills | Status: AC

## 2019-06-22 MED ORDER — PROBIOTIC PO CAPS: 1.0000 | ORAL_CAPSULE | Freq: Every day | ORAL | 1 refills | Status: DC

## 2019-06-22 MED ORDER — OXYCODONE-ACETAMINOPHEN 5-325 MG PO TABS: 2.0000 | ORAL_TABLET | Freq: Four times a day (QID) | ORAL | 0 refills | Status: DC | PRN

## 2019-06-22 MED ORDER — CIPROFLOXACIN HCL 500 MG PO TABS
500.0000 mg | ORAL_TABLET | Freq: Once | ORAL | Status: AC
  Administered 2019-06-22: 500 mg via ORAL
  Filled 2019-06-22: qty 1

## 2019-06-22 MED ORDER — METRONIDAZOLE 500 MG PO TABS
500.0000 mg | ORAL_TABLET | Freq: Once | ORAL | Status: AC
  Administered 2019-06-22: 500 mg via ORAL
  Filled 2019-06-22: qty 1

## 2019-06-22 MED ORDER — SODIUM CHLORIDE 0.9 % IV BOLUS (SEPSIS)
1000.0000 mL | Freq: Once | INTRAVENOUS | Status: AC
  Administered 2019-06-22: 1000 mL via INTRAVENOUS

## 2019-06-22 NOTE — ED Notes (Signed)
The pt is asking for pain med again

## 2019-06-22 NOTE — ED Provider Notes (Signed)
TIME SEEN: 3:21 AM  CHIEF COMPLAINT: Left lower quadrant abdominal pain  HPI: Patient is a 59 year old female with history of hypertension, hyperlipidemia, lupus, fibromyalgia who presents emergency department left lower quadrant pain has been ongoing for a week and worsened today.  She had one episode of diarrhea 3 days ago.  No nausea or vomiting.  States this feels like previous episodes of diverticulitis.  Had fever of 104 at home tonight.  Has had previous appendectomy and hysterectomy.  Denies dysuria, hematuria, vaginal bleeding or discharge.  Denies fevers, cough, sore throat, shortness of breath, loss of taste or smell.  Has had both COVID-19 vaccinations.  ROS: See HPI Constitutional:  fever  Eyes: no drainage  ENT: no runny nose   Cardiovascular:  no chest pain  Resp: no SOB  GI: no vomiting GU: no dysuria Integumentary: no rash  Allergy: no hives  Musculoskeletal: no leg swelling  Neurological: no slurred speech ROS otherwise negative  PAST MEDICAL HISTORY/PAST SURGICAL HISTORY:  Past Medical History:  Diagnosis Date  . CKD (chronic kidney disease)   . Depression   . Facial droop   . Fibromyalgia   . Hyperlipidemia   . Hypertension   . Hypothyroid   . IBS (irritable bowel syndrome)   . Insomnia   . Lupus ([place])   . Migraine   . RLS (restless legs syndrome)   . Skin cancer Had cancer removed in 2000  . Thyroid disease     MEDICATIONS:  Prior to Admission medications   Medication Sig Start Date End Date Taking? Authorizing Provider  doxepin (SINEQUAN) 10 MG capsule Take 10 mg by mouth. 1-2 at bedtime, do not take with trazodone    [provider]  levothyroxine (SYNTHROID) 100 MCG tablet Take 100 mcg by mouth daily. 09/26/18   [provider]  LORazepam (ATIVAN) 1 MG tablet Take 1 mg by mouth 3 (three) times daily as needed. 05/21/19   [provider]  omeprazole (PRILOSEC) 20 MG capsule Take 20 mg by mouth daily.    [provider]  ondansetron (ZOFRAN ODT) 4 MG disintegrating tablet Take 1 tablet (4 mg total) by mouth every 8 (eight) hours as needed for nausea or vomiting. 03/31/19   Lucrezia Starch, MD  pravastatin (PRAVACHOL) 10 MG tablet Take 10 mg by mouth daily.    [provider]  pyridostigmine (MESTINON) 60 MG tablet Take 0.5-1 tablets (30-60 mg total) by mouth 3 (three) times daily. 06/01/19   Penumalli, Earlean Polka, MD  tiZANidine (ZANAFLEX) 4 MG tablet 4 mg. 09/15/18   [provider]  traZODone (DESYREL) 50 MG tablet Take 50 mg by mouth at bedtime. 1-3 at bedtime as needed    [provider]  zolpidem (AMBIEN CR) 12.5 MG CR tablet Take 12.5 mg by mouth at bedtime as needed for sleep. 03/11/19   [provider]    ALLERGIES:  Allergies  Allergen Reactions  . Erythromycin Anaphylaxis    Chest pain  . Morphine And Related Nausea And Vomiting    Not given  . Penicillins Other (See Comments)    Family allergy.-- sister had an allergic reaction and the doctor told her that the whole family would have it. Never actually taken medication.  Has patient had a PCN reaction causing immediate rash, facial/tongue/throat swelling, SOB or lightheadedness with hypotension: No answer  Has patient had a PCN reaction causing severe rash involving mucus membranes or skin necrosis:No answer  Has patient had a PCN reaction  that required hospitalization no answer  Has patient had a PCN reaction occurring wit  . Promethazine     "made her sick"  . Simvastatin Other (See Comments)    Muscle aches    SOCIAL HISTORY:  Social History   Tobacco Use  . Smoking status: Former Smoker    Packs/day: 2.00    Years: 30.00    Pack years: 60.00    Types: Cigarettes    Quit date: 06/23/2006    Years since quitting: 13.0  . Smokeless tobacco: Never Used  Substance Use Topics  . Alcohol use: No    FAMILY HISTORY: Family History  Problem Relation Age of Onset  . Sleep apnea Mother   .  Irregular heart beat Mother        A fib  . Hypertension Mother   . Diabetes Mother   . Dementia Mother   . Pneumonia Father   . Sleep apnea Brother   . Dementia Maternal Grandmother     EXAM: BP 95/78   Pulse 77   Temp (!) 97.3 F (36.3 C)   Resp 17   Ht 5\' 8"  (1.727 m)   Wt 81.6 kg   SpO2 96%   BMI 27.35 kg/m  CONSTITUTIONAL: Alert and oriented and responds appropriately to questions. Well-appearing; well-nourished HEAD: Normocephalic EYES: Conjunctivae clear, pupils appear equal, EOM appear intact ENT: normal nose; moist mucous membranes NECK: Supple, normal ROM CARD: RRR; S1 and S2 appreciated; no murmurs, no clicks, no rubs, no gallops RESP: Normal chest excursion without splinting or tachypnea; breath sounds clear and equal bilaterally; no wheezes, no rhonchi, no rales, no hypoxia or respiratory distress, speaking full sentences ABD/GI: Normal bowel sounds; non-distended; soft, tender to palpation in the left lower quadrant, no rebound, no guarding, no peritoneal signs, no hepatosplenomegaly BACK:  The back appears normal EXT: Normal ROM in all joints; no deformity noted, no edema; no cyanosis SKIN: Normal color for age and race; warm; no rash on exposed skin NEURO: Moves all extremities equally PSYCH: The patient's mood and manner are appropriate.   MEDICAL DECISION MAKING: Patient here with fever at home, lower abdominal pain.  Labs obtained in triage reviewed/interpreted and show mild leukocytosis and mildly elevated creatinine.  Will give IV fluids.  Lipase, LFTs normal.  Will treat symptomatically with fentanyl, Zofran.  Will obtain CT of abdomen pelvis.  Urine pending.  Differential includes diverticulitis, colitis, bowel obstruction, UTI, kidney stone, pyelonephritis.  ED PROGRESS: Patient reports feeling better after IV fentanyl.  She has not vomiting here.  CT reviewed/interpreted and shows acute sigmoid diverticulitis without acute complication.  I feel she can be  discharged home on Cipro, Flagyl with pain and nausea medicine.  She states she is comfortable with this plan.  Labs did show mild AKI but has received a liter of IV fluids here.  At this time, I do not feel there is any life-threatening condition present. I have reviewed, interpreted and discussed all results (EKG, imaging, lab, urine as appropriate) and exam findings with patient/family. I have reviewed nursing notes and appropriate previous records.  I feel the patient is safe to be discharged home without further emergent workup and can continue workup as an outpatient as needed. Discussed usual and customary return precautions. Patient/family verbalize understanding and are comfortable with this plan.  Outpatient follow-up has been provided as needed. All questions have been answered.     Kathleen Sweeney was evaluated in Emergency Department on 06/22/2019 for the symptoms described  in the history of present illness. She was evaluated in the context of the global COVID-19 pandemic, which necessitated consideration that the patient might be at risk for infection with the SARS-CoV-2 virus that causes COVID-19. Institutional protocols and algorithms that pertain to the evaluation of patients at risk for COVID-19 are in a state of rapid change based on information released by regulatory bodies including the CDC and federal and state organizations. These policies and algorithms were followed during the patient's care in the ED.      Jax Kentner, Delice Bison, DO 06/22/19 [phone]

## 2019-06-22 NOTE — ED Notes (Signed)
The pts pain is better.  She is asking for something to drink  No right now

## 2019-07-01 DIAGNOSIS — J0100 Acute maxillary sinusitis, unspecified: Secondary | ICD-10-CM | POA: Diagnosis not present

## 2019-07-01 DIAGNOSIS — E785 Hyperlipidemia, unspecified: Secondary | ICD-10-CM | POA: Diagnosis not present

## 2019-07-01 DIAGNOSIS — E039 Hypothyroidism, unspecified: Secondary | ICD-10-CM | POA: Diagnosis not present

## 2019-07-01 DIAGNOSIS — I10 Essential (primary) hypertension: Secondary | ICD-10-CM | POA: Diagnosis not present

## 2019-07-01 DIAGNOSIS — R1032 Left lower quadrant pain: Secondary | ICD-10-CM | POA: Diagnosis not present

## 2019-07-02 ENCOUNTER — Encounter: Payer: BC Managed Care – PPO | Admitting: Diagnostic Neuroimaging

## 2019-07-06 ENCOUNTER — Encounter: Payer: Self-pay | Admitting: Neurology

## 2019-07-06 ENCOUNTER — Other Ambulatory Visit: Payer: Self-pay

## 2019-07-06 ENCOUNTER — Ambulatory Visit: Payer: BC Managed Care – PPO | Admitting: Neurology

## 2019-07-06 VITALS — Ht 68.0 in | Wt 178.0 lb

## 2019-07-06 DIAGNOSIS — F5101 Primary insomnia: Secondary | ICD-10-CM | POA: Diagnosis not present

## 2019-07-06 DIAGNOSIS — R0683 Snoring: Secondary | ICD-10-CM

## 2019-07-06 NOTE — Patient Instructions (Signed)
Persistent Depressive Disorder, Adult Persistent depressive disorder (PDD) is a mental health condition that causes symptoms of low-level depression for 2 years or longer. It may also be called long-term (chronic) depression or dysthymia. PDD may include episodes of more severe depression that last for about 2 weeks (major depressive disorder or MDD). PDD can affect the way you think, feel, and sleep. This condition may also affect your relationships. You may be more likely to get sick if you have PDD. What are the causes? The exact cause of this condition is not known. PDD is most likely caused by a combination of things, which may include:  Genetic factors. These are traits that are passed along from parent to child.  Individual factors. Your personality, your behavior, and the way you handle your thoughts and feelings may contribute to PDD. This includes personality traits and behaviors learned from others.  Physical factors, such as: ? Differences in the part of your brain that controls emotion. This part of your brain may be different than it is in people who do not have PDD. ? Long-term (chronic) medical or psychiatric illnesses.  Social factors. Traumatic experiences or major life changes may play a role in the development of PDD. What increases the risk? This condition is more likely to develop in women. The following factors may make you more likely to develop PDD:  A family history of depression.  Abnormally low levels of certain brain chemicals.  Traumatic events in childhood, especially abuse or the loss of a parent.  Being under a lot of stress, or long-term stress, especially from upsetting life experiences or losses.  A history of: ? Chronic physical illness. ? Other mental health disorders. ? Substance abuse.  Poor living conditions.  Experiencing social exclusion or discrimination on a regular basis. What are the signs or symptoms? Symptoms of this condition occur  for most of the day, and may include:  Fatigue or low energy.  Eating too much or too little.  Sleeping too much or too little.  Restlessness or agitation.  Feelings of hopelessness.  Feeling worthless or guilty.  Anxiety.  Poor concentration or difficulty making decisions.  Low self-esteem.  Negative outlook.  Inability to have fun or experience pleasure.  Social withdrawal.  Unexplained physical complaints.  Irritability.  Aggressive behavior or anger. How is this diagnosed? This condition may be diagnosed based on:  Your symptoms.  Your medical history, including your mental health history. This may involve tests to evaluate your mental health. You may be asked questions about your lifestyle, including any drug and alcohol use, and how long you have had symptoms of PDD.  A physical exam.  Blood tests to rule out other conditions. You may be diagnosed with PDD if you have had a depressed mood for 2 years or longer, as well as other symptoms of depression. How is this treated? This condition is usually treated by mental health professionals, such as psychologists, psychiatrists, and clinical social workers. You may need more than one type of treatment. Treatment may include:  Psychotherapy. This is also called talk therapy or counseling. Types of psychotherapy include: ? Cognitive behavioral therapy (CBT). This type of therapy teaches you to recognize unhealthy feelings, thoughts, and behaviors, and replace them with positive thoughts and actions. ? Interpersonal therapy (IPT). This helps you to improve the way you relate to and communicate with others. ? Family therapy. This treatment includes members of your family.  Medicine to treat anxiety and depression, or to help  you control certain emotions and behaviors.  Lifestyle changes, such as: ? Limiting alcohol and drug use. ? Exercising regularly. ? Getting plenty of sleep. ? Making healthy eating choices. ?  Spending more time outdoors.  Follow these instructions at home: Activity  Return to your normal activities as told by your health care provider.  Exercise regularly and spend time outdoors as told by your health care provider. General instructions  Take over-the-counter and prescription medicines only as told by your health care provider.  Do not drink alcohol. If you drink alcohol, limit your alcohol intake to no more than 1 drink a day for nonpregnant women and 2 drinks a day for men. One drink equals 12 oz of beer, 5 oz of wine, or 1 oz of hard liquor. Alcohol can affect any antidepressant medicines you are taking. Talk to your health care provider about your alcohol use.  Eat a healthy diet and get plenty of sleep.  Find activities that you enjoy doing, and make time to do them.  Consider joining a support group. Your health care provider may be able to recommend a support group.  Keep all follow-up visits as told by your health care provider. This is important. Where to find more information [employer] on Mental Illness  www.nami.org [place] National Institute of Mental Health  https://carter.com/ National Suicide Prevention Lifeline  1-800-273-TALK (513)491-4700). This is free, 24-hour help. Contact a health care provider if:  Your symptoms get worse.  You develop new symptoms.  You have trouble sleeping or doing your daily activities. Get help right away if:  You self-harm.  You have serious thoughts about hurting yourself or others.  You see, hear, taste, smell, or feel things that are not present (hallucinate). This information is not intended to replace advice given to you by your health care provider. Make sure you discuss any questions you have with your health care provider. Document Revised: 01/11/2017 Document Reviewed: 08/13/2015 Elsevier Patient Education  [place]. Insomnia Insomnia is a sleep disorder that makes it difficult to fall asleep or  stay asleep. Insomnia can cause fatigue, low energy, difficulty concentrating, mood swings, and poor performance at work or school. There are three different ways to classify insomnia:  Difficulty falling asleep.  Difficulty staying asleep.  Waking up too early in the morning. Any type of insomnia can be long-term (chronic) or short-term (acute). Both are common. Short-term insomnia usually lasts for three months or less. Chronic insomnia occurs at least three times a week for longer than three months. What are the causes? Insomnia may be caused by another condition, situation, or substance, such as:  Anxiety.  Certain medicines.  Gastroesophageal reflux disease (GERD) or other gastrointestinal conditions.  Asthma or other breathing conditions.  Restless legs syndrome, sleep apnea, or other sleep disorders.  Chronic pain.  Menopause.  Stroke.  Abuse of alcohol, tobacco, or illegal drugs.  Mental health conditions, such as depression.  Caffeine.  Neurological disorders, such as Alzheimer's disease.  An overactive thyroid (hyperthyroidism). Sometimes, the cause of insomnia may not be known. What increases the risk? Risk factors for insomnia include:  Gender. Women are affected more often than men.  Age. Insomnia is more common as you get older.  Stress.  Lack of exercise.  Irregular work schedule or working night shifts.  Traveling between different time zones.  Certain medical and mental health conditions. What are the signs or symptoms? If you have insomnia, the main symptom is having trouble falling asleep  or having trouble staying asleep. This may lead to other symptoms, such as:  Feeling fatigued or having low energy.  Feeling nervous about going to sleep.  Not feeling rested in the morning.  Having trouble concentrating.  Feeling irritable, anxious, or depressed. How is this diagnosed? This condition may be diagnosed based on:  Your symptoms  and medical history. Your health care provider may ask about: ? Your sleep habits. ? Any medical conditions you have. ? Your mental health.  A physical exam. How is this treated? Treatment for insomnia depends on the cause. Treatment may focus on treating an underlying condition that is causing insomnia. Treatment may also include:  Medicines to help you sleep.  Counseling or therapy.  Lifestyle adjustments to help you sleep better. Follow these instructions at home: Eating and drinking   Limit or avoid alcohol, caffeinated beverages, and cigarettes, especially close to bedtime. These can disrupt your sleep.  Do not eat a large meal or eat spicy foods right before bedtime. This can lead to digestive discomfort that can make it hard for you to sleep. Sleep habits   Keep a sleep diary to help you and your health care provider figure out what could be causing your insomnia. Write down: ? When you sleep. ? When you wake up during the night. ? How well you sleep. ? How rested you feel the next day. ? Any side effects of medicines you are taking. ? What you eat and drink.  Make your bedroom a dark, comfortable place where it is easy to fall asleep. ? Put up shades or blackout curtains to block light from outside. ? Use a white noise machine to block noise. ? Keep the temperature cool.  Limit screen use before bedtime. This includes: ? Watching TV. ? Using your smartphone, tablet, or computer.  Stick to a routine that includes going to bed and waking up at the same times every day and night. This can help you fall asleep faster. Consider making a quiet activity, such as reading, part of your nighttime routine.  Try to avoid taking naps during the day so that you sleep better at night.  Get out of bed if you are still awake after 15 minutes of trying to sleep. Keep the lights down, but try reading or doing a quiet activity. When you feel sleepy, go back to bed. General  instructions  Take over-the-counter and prescription medicines only as told by your health care provider.  Exercise regularly, as told by your health care provider. Avoid exercise starting several hours before bedtime.  Use relaxation techniques to manage stress. Ask your health care provider to suggest some techniques that may work well for you. These may include: ? Breathing exercises. ? Routines to release muscle tension. ? Visualizing peaceful scenes.  Make sure that you drive carefully. Avoid driving if you feel very sleepy.  Keep all follow-up visits as told by your health care provider. This is important. Contact a health care provider if:  You are tired throughout the day.  You have trouble in your daily routine due to sleepiness.  You continue to have sleep problems, or your sleep problems get worse. Get help right away if:  You have serious thoughts about hurting yourself or someone else. If you ever feel like you may hurt yourself or others, or have thoughts about taking your own life, get help right away. You can go to your nearest emergency department or call:  Your local emergency services (911  in the [place]  A suicide crisis helpline, such as the [place] at 9011496667. This is open 24 hours a day. Summary  Insomnia is a sleep disorder that makes it difficult to fall asleep or stay asleep.  Insomnia can be long-term (chronic) or short-term (acute).  Treatment for insomnia depends on the cause. Treatment may focus on treating an underlying condition that is causing insomnia.  Keep a sleep diary to help you and your health care provider figure out what could be causing your insomnia. This information is not intended to replace advice given to you by your health care provider. Make sure you discuss any questions you have with your health care provider. Document Revised: 01/11/2017 Document Reviewed: 11/08/2016 Elsevier Patient  Education  2020 [employer].

## 2019-07-06 NOTE — Progress Notes (Signed)
SLEEP MEDICINE CLINIC    Provider:  Larey Seat, MD  Primary Care Physician:  Maurice Small, MD [address] [address]     Referring Provider: Dr Chucky May, MD         Chief Complaint according to patient   Patient presents with:    . New Patient (Initial Visit)      patient of dr Leta Baptist, weakness evaluation on 06-01-2019. Here for Insomnia.       HISTORY OF PRESENT ILLNESS:  Kathleen Sweeney is a 59 y.o. year old  Caucasian female patient seen upon a referral by her psychiatrist , Dr Toy Care, on 07/06/2019 . Chief concern according to patient : chronic insomnia, insufficicent sleep.  Was seen in 2011 upon Dr. Sheralyn Boatman, MD.pt struggles with getting sufficient sleep. she states that averages over the night about 2 hrs of sleep in a night. states that she has struggled with this since age [age].   I have the pleasure of seeing Kathleen Sweeney today, a  right-handed White or Caucasian female with INSOMNIA, a chronic sleep disorder.  She  has a past medical history of CKD (chronic kidney disease), Depression, Facial droop, Fibromyalgia, Hyperlipidemia, Hypertension, Hypothyroid, IBS (irritable bowel syndrome), Insomnia, Lupus ([place]), Migraine, RLS (restless legs syndrome),  Weakness, Skin cancer (Had cancer removed in 2000), and Thyroid disease.     Sleep relevant medical history: Insomnia, difficulties to initiate and maintain sleep. No ENT surgery or trauma. Patient also was admitted to the hospital in the year 2013 in March for nausea and vomiting, this was preceded by diagnosis of decreased kidney function apparently her creatinine was level 7, her bladder was emptied which means that she may have had hydronephrosis.  She was transferred to [facility] to be on the nephrology ward.  Creatinine was reduced to 2.  But she did cough pneumonia during this hospitalization..  She felt accused of being a drug user because she did not respond to Ativan  and other sedatives with the expected sleep.  The patient had the first sleep study in the year 2011 which found no organic sleep disorder- The study was performed on 08 September 2009 upon referral by Dr. Letta Moynahan, MD.  The recording started at 21 hours 45 minutes lights out time was 21 hours 53 minutes sleep onset time was after 34 minutes.  REM sleep latency 189 minutes sleep efficiency was only 57.6% with a total sleep time of 267 minutes.  The patient had very mild sleep apnea AHI was 5.8 strongly REM sleep accentuated with a REM AHI of 21.8.  No oxygen desaturation no periodic limb movements at the time the patient had reported her insomnia started after she abruptly stopped taking Ambien which she had been taking for 5 years.  Her husband described a severe snore with pauses in breathing.  She wakes up with headaches.  In 2011 she took temazepam, Requip and Seroquel all prescribed through her psychiatrist.  She followed at [facility], and was seen only twice, and her insurance didn't cover her.  This was behavioral health. She d/c abruptly all anti-depression medication by January 2021 and the "good feeling" lasted 2 month.  She felt weakness from September 2020 and up to [facility] visit- he did not yet come to a conclusion, and EMG is pending.     Family medical /sleep history: brother on CPAP with OSA, mother had apnea , not insomnia. died in her  sleep, had heart disease. Dementia.    Social history:  Patient is retired since 2010 and watches her grand daughter and lives in a household with her spouse.  He now has observed loud snoring on Zanaflex. She also snores when not on meds, but not as loud.  The patient currentlyused to work daytime. Pets are present. One dog. Tobacco use- none , quit in 1998, after 25 years . ETOH use - none ,  Caffeine intake in form of Coffee( d/c ) Soda( none ) Tea ( rare ) no energy drinks. Regular exercise in form of walking      Sleep  habits are as follows: The patient's dinner time is between 6 PM.  The patient goes to bed at 11 PM and the bedroom is cool, quiet and dark.  She has a special mattress. She continues to  Have trouble initiating sleep, often it is 2 AM , sleeps for 2-3 hours, wakes for unknown reasons.  RLS is well controlled.   The preferred sleep position is on her sides , with the support of 1 pillow.  Dreams are reportedly rare. Sometimes nightmares. Averaging 2-3 hours of sleep in 8 hours of time in bed.    6 AM is the usual rise time. The patient wakes up spontaneously. She reports not feeling refreshed or restored in AM, with symptoms such as dry mouth, morning headaches , and residual fatigue, sometimes groggy- on Zanaflex. She likes to sleep in daytime.  .  Naps are never taken .  She is never really sleepy- is it sleep perception?      Review of Systems: Out of a complete 14 system review, the patient complains of only the following symptoms, and all other reviewed systems are negative.:  Fatigue, sleepiness , snoring, fragmented sleep,  Insomnia ( chronic, since her forties ) . Hysterectomy at age 46/ quit smoking, drinking and caffeine.     How likely are you to doze in the following situations: 0 = not likely, 1 = slight chance, 2 = moderate chance, 3 = high chance   Sitting and Reading? Watching Television? Sitting inactive in a public place (theater or meeting)? As a passenger in a car for an hour without a break? Lying down in the afternoon when circumstances permit? Sitting and talking to someone? Sitting quietly after lunch without alcohol? In a car, while stopped for a few minutes in traffic?   Total = 1/ 24 points   FSS endorsed at 52/ 63 points.   primary physician - FP Dr Justin Mend, MD    Social History   Socioeconomic History  . Marital status: Married    Spouse name: Araceli Bouche  . Number of children: 2  . Years of education: Not on file  . Highest education level: High  school graduate  Occupational History    Comment: na  Tobacco Use  . Smoking status: Former Smoker    Packs/day: 2.00    Years: 30.00    Pack years: 60.00    Types: Cigarettes    Quit date: 06/23/2006    Years since quitting: 13.0  . Smokeless tobacco: Never Used  Substance and Sexual Activity  . Alcohol use: No  . Drug use: Not Currently    Types: Marijuana, Other-see comments    Comment: CBD oil, last used 2020  . Sexual activity: Never  Other Topics Concern  . Not on file  Social History Narrative   Lives with spouse   Caffeine-tea 3-4 weekly  Social Determinants of Health   Financial Resource Strain:   . Difficulty of Paying Living Expenses:   Food Insecurity:   . Worried About Charity fundraiser in the Last Year:   . Arboriculturist in the Last Year:   Transportation Needs:   . Film/video editor (Medical):   Marland Kitchen Lack of Transportation (Non-Medical):   Physical Activity:   . Days of Exercise per Week:   . Minutes of Exercise per Session:   Stress:   . Feeling of Stress :   Social Connections:   . Frequency of Communication with Friends and Family:   . Frequency of Social Gatherings with Friends and Family:   . Attends Religious Services:   . Active Member of Clubs or Organizations:   . Attends Archivist Meetings:   Marland Kitchen Marital Status:     Family History  Problem Relation Age of Onset  . Sleep apnea Mother   . Irregular heart beat Mother        A fib  . Hypertension Mother   . Diabetes Mother   . Dementia Mother   . Pneumonia Father   . Sleep apnea Brother   . Dementia Maternal Grandmother     Past Medical History:  Diagnosis Date  . CKD (chronic kidney disease)   . Depression   . Facial droop   . Fibromyalgia   . Hyperlipidemia   . Hypertension   . Hypothyroid   . IBS (irritable bowel syndrome)   . Insomnia   . Lupus ([place])   . Migraine   . RLS (restless legs syndrome)   . Skin cancer Had cancer removed in 2000  . Thyroid  disease     Past Surgical History:  Procedure Laterality Date  . ABDOMINAL HYSTERECTOMY  2008  . APPENDECTOMY  1998  . thoracic tumor  03/1995   excision     Current Outpatient Medications on File Prior to Visit  Medication Sig Dispense Refill  . acetaminophen (TYLENOL) 325 MG tablet Take 325 mg by mouth 2 (two) times daily as needed for mild pain.    Marland Kitchen doxepin (SINEQUAN) 25 MG capsule Take 25 mg by mouth at bedtime.    Marland Kitchen levothyroxine (SYNTHROID) 100 MCG tablet Take 100 mcg by mouth daily.    Marland Kitchen LORazepam (ATIVAN) 1 MG tablet Take 1 mg by mouth 3 (three) times daily as needed for anxiety or sleep.     Marland Kitchen omeprazole (PRILOSEC) 20 MG capsule Take 20 mg by mouth daily as needed.     . pravastatin (PRAVACHOL) 10 MG tablet Take 10 mg by mouth daily.    . Probiotic CAPS Take 1 capsule by mouth daily. 30 capsule 1  . tiZANidine (ZANAFLEX) 4 MG tablet Take 4 mg by mouth at bedtime as needed for muscle spasms.     Marland Kitchen zolpidem (AMBIEN CR) 12.5 MG CR tablet Take 12.5 mg by mouth at bedtime as needed for sleep.     No current facility-administered medications on file prior to visit.    Allergies  Allergen Reactions  . Erythromycin Anaphylaxis    Chest pain  . Morphine And Related Nausea And Vomiting    Not given  . Penicillins Other (See Comments)    Family allergy.-- sister had an allergic reaction and the doctor told her that the whole family would have it. Never actually taken medication.  Has patient had a PCN reaction causing immediate rash, facial/tongue/throat swelling, SOB or lightheadedness with hypotension: No  answer  Has patient had a PCN reaction causing severe rash involving mucus membranes or skin necrosis:No answer  Has patient had a PCN reaction that required hospitalization no answer  Has patient had a PCN reaction occurring wit  . Promethazine Nausea And Vomiting    "made her sick"  . Simvastatin Other (See Comments)    Muscle aches    Physical exam:  Today's Vitals     07/06/19 1254  Weight: 178 lb (80.7 kg)  Height: 5\' 8"  (1.727 m)   Body mass index is 27.06 kg/m.   Wt Readings from Last 3 Encounters:  07/06/19 178 lb (80.7 kg)  06/21/19 179 lb 14.3 oz (81.6 kg)  06/01/19 180 lb (81.6 kg)     Ht Readings from Last 3 Encounters:  07/06/19 5\' 8"  (1.727 m)  06/21/19 5\' 8"  (1.727 m)  06/01/19 5\' 8"  (1.727 m)      General: The patient is awake, alert and appears fatigued and distressed. The patient is well groomed. Head: Normocephalic, atraumatic. Neck is supple. Mallampati 3 plus- uvula not seen. ,  neck circumference: 14.5  inches .  Nasal airflow patent.  Retrognathia is seen.  Crowded Dental status: biological teeth. Reports a thyroid nodule.  Cardiovascular:  Regular rate and cardiac rhythm by pulse,  without distended neck veins. Respiratory: Lungs are clear to auscultation.  Skin:  Without evidence of ankle edema, or rash. Trunk: The patient's posture is erect.   Neurologic exam : The patient is awake and alert, oriented to place and time.   Memory subjective described as intact.  Attention span & concentration ability appears normal.  Speech is fluent,  without  dysarthria, dysphonia or aphasia.  Mood and affect are appropriate.   Cranial nerves: no loss of smell or taste reported  Pupils are equal and briskly reactive to light. Funduscopic exam deferred.   Extraocular movements in vertical and horizontal planes were intact and without nystagmus. No Diplopia. Visual fields by finger perimetry are intact. Hearing was intact to soft voice and finger rubbing.    Facial sensation intact to fine touch.  Facial motor strength is symmetric and tongue and uvula move midline.  Neck ROM : rotation, tilt and flexion extension were normal for age and shoulder shrug was symmetrical.    Motor exam:  Symmetric bulk, tone and ROM.   she cannot relax, there is tension and tone , not allowing a baseline- biceps tone feels elevated with some  irregular resistance.  Normal tone without cog- wheeling, symmetric grip strength .   Sensory:  Fine touch, pinprick and vibration were normal.  Proprioception tested in the upper extremities was normal.   Coordination: Rapid alternating movements in the fingers/hands were of normal speed.  The Finger-to-nose maneuver was intact without evidence of ataxia, dysmetria or tremor.   Gait and station: Patient could rise unassisted from a seated position, walked without assistive device.  Stance is of normal width/ base and the patient turned with 3 steps.  Toe and heel walk were deferred.  Deep tendon reflexes: in the  upper and lower extremities are symmetric and intact.  Babinski response was deferred .      After spending a total time of 48  minutes face to face and additional time for physical and neurologic examination, review of laboratory studies,  Reviewed reports by [name].   personal review of imaging studies, reports and results of other testing and review of referral information / records as far as provided in  visit, I have established the following assessments:  1) I will recheck for apnea an organic reasons for insomnia. I will not follow up caring for a non organic sleep disorder.   2) Chronic Primary insomnia suspected, this is a most resistent form of insomnia.   3) no history of inherited short sleeper syndrome.    My Plan is to proceed with:  1) Attended sleep study. If negative will need to change to behavioral health for INSOMNIA treatment follow up.  2) Dr Leta Baptist will remain her general Neurologist. He has seen her for PTOSIS, weakness and numbness.    I would like to thank Maurice Small, MD and Dr Toy Care, MD  for allowing me to meet with and to take care of this pleasant patient.   In short, LETITIA SNUFFER is presenting with  I plan to follow up  through our NP within 3 month if the sleep study yields some organic sleep disorder.     Electronically  signed by: Larey Seat, MD 07/06/2019 1:11 PM  [facility] [place] certified by The [employer] of Sleep Medicine and Diplomate of the [employer] of Sleep Medicine. Board certified In Neurology through the [place], Fellow of the [employer] of Neurology. Medical Director of [employer].

## 2019-07-09 ENCOUNTER — Encounter: Payer: BC Managed Care – PPO | Admitting: Diagnostic Neuroimaging

## 2019-07-30 ENCOUNTER — Encounter: Payer: BC Managed Care – PPO | Admitting: Diagnostic Neuroimaging

## 2019-07-30 ENCOUNTER — Ambulatory Visit (INDEPENDENT_AMBULATORY_CARE_PROVIDER_SITE_OTHER): Payer: BC Managed Care – PPO | Admitting: Diagnostic Neuroimaging

## 2019-07-30 DIAGNOSIS — R531 Weakness: Secondary | ICD-10-CM

## 2019-07-30 DIAGNOSIS — Z0289 Encounter for other administrative examinations: Secondary | ICD-10-CM

## 2019-07-30 DIAGNOSIS — H02403 Unspecified ptosis of bilateral eyelids: Secondary | ICD-10-CM

## 2019-07-30 NOTE — Procedures (Signed)
[facility]  NCS (NERVE CONDUCTION STUDY) WITH EMG (ELECTROMYOGRAPHY) REPORT   STUDY DATE: 07/30/19 PATIENT NAME: Kathleen Sweeney DOB: 08-11-1960 MRN: [id number]  ORDERING CLINICIAN: Andrey Spearman, MD   TECHNOLOGIST: Sherre Scarlet ELECTROMYOGRAPHER: Earlean Polka. Aliene Tamura, MD  CLINICAL INFORMATION: 59 year old female with ptosis and weakness.  Evaluate for myasthenia gravis.   FINDINGS: NERVE CONDUCTION STUDY:  Right median, right ulnar, right peroneal and right tibial motor responses normal.  Right sural, right superficial peroneal, right median and right ulnar sensory responses are normal.  Right tibial and right ulnar F-wave latencies are normal.  Repetitive nerve stimulation of right spinal accessory nerve and recording of right trapezius muscle was performed at baseline, immediately after exertion and at 1 minute increments up to 5 minutes after exertion.  No significant decremental response was noted at baseline or following exertion.   NEEDLE ELECTROMYOGRAPHY:  Needle examination of right upper extremity is normal.    IMPRESSION:   This is a normal study.  No electrodiagnostic evidence of large fiber neuropathy, myopathy or neuromuscular junction disorder at this time.    INTERPRETING PHYSICIAN:  [name], MD Certified in Neurology, Neurophysiology and Neuroimaging  [facility] [address] [place], [place] [address] [phone]   [facility]    Nerve / Sites Muscle Latency Ref. Amplitude Ref. Rel Amp Segments Distance Velocity Ref. Area    ms ms mV mV %  cm m/s m/s mVms  R Median - APB     Wrist APB 3.0 ?4.4 8.0 ?4.0 100 Wrist - APB 7   26.8     Upper arm APB 7.1  7.5  93.4 Upper arm - Wrist 23 55 ?49 24.5  R Ulnar - ADM     Wrist ADM 2.7 ?3.3 9.2 ?6.0 100 Wrist - ADM 7   24.4     B.Elbow ADM 6.0  8.5  92.2 B.Elbow - Wrist 21 63 ?49 24.1     A.Elbow ADM 7.9  8.5  101 A.Elbow - B.Elbow 10 53 ?49 24.2          A.Elbow - Wrist      R Peroneal - EDB     Ankle EDB 3.5 ?6.5 4.8 ?2.0 100 Ankle - EDB 9   19.8     Fib head EDB 10.1  4.1  85.8 Fib head - Ankle 32 48 ?44 20.6     Pop fossa EDB 12.2  4.4  106 Pop fossa - Fib head 10 49 ?44 21.3         Pop fossa - Ankle      R Tibial - AH     Ankle AH 3.6 ?5.8 8.7 ?4.0 100 Ankle - AH 9   13.3     Pop fossa AH 13.0  4.9  56.4 Pop fossa - Ankle 41 44 ?41 13.4             SNC    Nerve / Sites Rec. Site Peak Lat Ref.  Amp Ref. Segments Distance    ms ms V V  cm  R Sural - Ankle (Calf)     Calf Ankle 3.7 ?4.4 15 ?6 Calf - Ankle 14  R Superficial peroneal - Ankle     Lat leg Ankle 2.5 ?4.4 31 ?6 Lat leg - Ankle 14  R Median - Orthodromic (Dig II, Mid palm)     Dig II Wrist 2.7 ?3.4 20 ?10 Dig II - Wrist 13  R Ulnar -  Orthodromic, (Dig V, Mid palm)     Dig V Wrist 2.5 ?3.1 11 ?5 Dig V - Wrist 52             F  Wave    Nerve F Lat Ref.   ms ms  R Tibial - AH 52.7 ?56.0  R Ulnar - ADM 28.4 ?32.0         Rep Stim    Anatomy / Train Rate Ampl. Ampl 4-1 Fac Ampl Area Area 4-1 Fac Area Time   Hz mV % % mVms % %   R Trapezius (upper) - (Accessory spinal)  Baseline @1Hz  1 5.3 0.3 100 51.7 2.6 100 0:00:00  Baseline @3Hz  3 5.5 4.7 103 52.5 20 101 0:00:26  Post Exercise @0 :00 3 4.8 -5 90.1 43.6 6.1 84.2 0:01:52  @ 0:30 3 5.7 5.1 106 52.8 17.1 102 0:02:42  @ 1:00 3 5.8 11 109 54.4 18.3 105 0:03:13  @ 2:00 3 5.7 4.2 107 54.5 20.5 105 0:04:14  @ 3:00 3 5.8 5.7 109 54.6 20 106 0:05:15  @ 4:00 3 5.8 9.1 109 54.1 16.9 105 0:06:16  @ 5:00 3 5.8 6.3 108 54.4 19.8 105 0:07:17       EMG Summary Table    Spontaneous MUAP Recruitment  Muscle IA Fib PSW Fasc Other Amp Dur. Poly Pattern  R. Deltoid Normal None None None _______ Normal Normal Normal Normal  R. Biceps brachii Normal None None None _______ Normal Normal Normal Normal  R. Triceps brachii Normal None None None _______ Normal Normal Normal Normal  R. Flexor carpi radialis Normal None None None  _______ Normal Normal Normal Normal  R. First dorsal interosseous Normal None None None _______ Normal Normal Normal Normal

## 2019-07-30 NOTE — Progress Notes (Signed)
-   check MRI brain  - may try tapering mestinon, to see if effective or not  - improve sleep hygiene  Orders Placed This Encounter  Procedures  . MR BRAIN W WO CONTRAST     [name], MD 9/41/7919, 9:57 PM Certified in Neurology, Neurophysiology and Neuroimaging  [facility] [address], [place], [place] [address] [phone]

## 2019-08-10 ENCOUNTER — Encounter (HOSPITAL_COMMUNITY): Payer: Self-pay | Admitting: Emergency Medicine

## 2019-08-10 ENCOUNTER — Telehealth: Payer: Self-pay | Admitting: Diagnostic Neuroimaging

## 2019-08-10 ENCOUNTER — Inpatient Hospital Stay (HOSPITAL_COMMUNITY)
Admission: EM | Admit: 2019-08-10 | Discharge: 2019-08-14 | DRG: 392 | Disposition: A | Discharge status: Home / Self Care | Payer: BC Managed Care – PPO | Attending: Internal Medicine | Admitting: Internal Medicine

## 2019-08-10 ENCOUNTER — Other Ambulatory Visit: Payer: Self-pay

## 2019-08-10 ENCOUNTER — Emergency Department (HOSPITAL_COMMUNITY): Payer: BC Managed Care – PPO

## 2019-08-10 DIAGNOSIS — K5900 Constipation, unspecified: Secondary | ICD-10-CM | POA: Diagnosis not present

## 2019-08-10 DIAGNOSIS — G4700 Insomnia, unspecified: Secondary | ICD-10-CM | POA: Diagnosis present

## 2019-08-10 DIAGNOSIS — Z87892 Personal history of anaphylaxis: Secondary | ICD-10-CM | POA: Diagnosis not present

## 2019-08-10 DIAGNOSIS — K5792 Diverticulitis of intestine, part unspecified, without perforation or abscess without bleeding: Secondary | ICD-10-CM | POA: Diagnosis present

## 2019-08-10 DIAGNOSIS — G2581 Restless legs syndrome: Secondary | ICD-10-CM | POA: Diagnosis present

## 2019-08-10 DIAGNOSIS — I129 Hypertensive chronic kidney disease with stage 1 through stage 4 chronic kidney disease, or unspecified chronic kidney disease: Secondary | ICD-10-CM | POA: Diagnosis not present

## 2019-08-10 DIAGNOSIS — E039 Hypothyroidism, unspecified: Secondary | ICD-10-CM | POA: Diagnosis not present

## 2019-08-10 DIAGNOSIS — Z79890 Hormone replacement therapy (postmenopausal): Secondary | ICD-10-CM | POA: Diagnosis not present

## 2019-08-10 DIAGNOSIS — K219 Gastro-esophageal reflux disease without esophagitis: Secondary | ICD-10-CM | POA: Diagnosis not present

## 2019-08-10 DIAGNOSIS — Z8249 Family history of ischemic heart disease and other diseases of the circulatory system: Secondary | ICD-10-CM

## 2019-08-10 DIAGNOSIS — R1110 Vomiting, unspecified: Secondary | ICD-10-CM | POA: Diagnosis not present

## 2019-08-10 DIAGNOSIS — N189 Chronic kidney disease, unspecified: Secondary | ICD-10-CM | POA: Diagnosis not present

## 2019-08-10 DIAGNOSIS — Z885 Allergy status to narcotic agent status: Secondary | ICD-10-CM | POA: Diagnosis not present

## 2019-08-10 DIAGNOSIS — Z20822 Contact with and (suspected) exposure to covid-19: Secondary | ICD-10-CM | POA: Diagnosis not present

## 2019-08-10 DIAGNOSIS — Z888 Allergy status to other drugs, medicaments and biological substances status: Secondary | ICD-10-CM

## 2019-08-10 DIAGNOSIS — E785 Hyperlipidemia, unspecified: Secondary | ICD-10-CM | POA: Diagnosis not present

## 2019-08-10 DIAGNOSIS — Z85828 Personal history of other malignant neoplasm of skin: Secondary | ICD-10-CM | POA: Diagnosis not present

## 2019-08-10 DIAGNOSIS — M797 Fibromyalgia: Secondary | ICD-10-CM | POA: Diagnosis present

## 2019-08-10 DIAGNOSIS — F419 Anxiety disorder, unspecified: Secondary | ICD-10-CM | POA: Diagnosis not present

## 2019-08-10 DIAGNOSIS — Z881 Allergy status to other antibiotic agents status: Secondary | ICD-10-CM | POA: Diagnosis not present

## 2019-08-10 DIAGNOSIS — Z79899 Other long term (current) drug therapy: Secondary | ICD-10-CM

## 2019-08-10 DIAGNOSIS — F329 Major depressive disorder, single episode, unspecified: Secondary | ICD-10-CM | POA: Diagnosis not present

## 2019-08-10 DIAGNOSIS — Z87891 Personal history of nicotine dependence: Secondary | ICD-10-CM

## 2019-08-10 DIAGNOSIS — K5732 Diverticulitis of large intestine without perforation or abscess without bleeding: Principal | ICD-10-CM | POA: Diagnosis present

## 2019-08-10 DIAGNOSIS — I10 Essential (primary) hypertension: Secondary | ICD-10-CM | POA: Diagnosis present

## 2019-08-10 DIAGNOSIS — Z03818 Encounter for observation for suspected exposure to other biological agents ruled out: Secondary | ICD-10-CM | POA: Diagnosis not present

## 2019-08-10 DIAGNOSIS — F32A Depression, unspecified: Secondary | ICD-10-CM | POA: Diagnosis present

## 2019-08-10 DIAGNOSIS — K589 Irritable bowel syndrome without diarrhea: Secondary | ICD-10-CM | POA: Diagnosis present

## 2019-08-10 LAB — URINALYSIS, ROUTINE W REFLEX MICROSCOPIC
Bilirubin Urine: NEGATIVE
Glucose, UA: NEGATIVE mg/dL
Hgb urine dipstick: NEGATIVE
Ketones, ur: NEGATIVE mg/dL
Leukocytes,Ua: NEGATIVE
Nitrite: NEGATIVE
Protein, ur: NEGATIVE mg/dL
Specific Gravity, Urine: 1.011 (ref 1.005–1.030)
pH: 6 (ref 5.0–8.0)

## 2019-08-10 LAB — CBC
HCT: 43.8 % (ref 36.0–46.0)
HCT: 43.1 % (ref 36.0–46.0)
Hemoglobin: 13.8 g/dL (ref 12.0–15.0)
Hemoglobin: 13.6 g/dL (ref 12.0–15.0)
MCH: 27.3 pg (ref 26.0–34.0)
MCH: 27.6 pg (ref 26.0–34.0)
MCHC: 31.5 g/dL (ref 30.0–36.0)
MCHC: 31.6 g/dL (ref 30.0–36.0)
MCV: 86.6 fL (ref 80.0–100.0)
MCV: 87.4 fL (ref 80.0–100.0)
Platelets: 259 10*3/uL (ref 150–400)
Platelets: 254 10*3/uL (ref 150–400)
RBC: 5.06 MIL/uL (ref 3.87–5.11)
RBC: 4.93 MIL/uL (ref 3.87–5.11)
RDW: 12.8 % (ref 11.5–15.5)
RDW: 13 % (ref 11.5–15.5)
WBC: 10.3 10*3/uL (ref 4.0–10.5)
WBC: 7.8 10*3/uL (ref 4.0–10.5)
nRBC: 0 % (ref 0.0–0.2)
nRBC: 0 % (ref 0.0–0.2)

## 2019-08-10 LAB — COMPREHENSIVE METABOLIC PANEL
ALT: 16 U/L (ref 0–44)
AST: 24 U/L (ref 15–41)
Albumin: 3.6 g/dL (ref 3.5–5.0)
Alkaline Phosphatase: 86 U/L (ref 38–126)
Anion gap: 10 (ref 5–15)
BUN: 9 mg/dL (ref 8–23)
CO2: 24 mmol/L (ref 22–32)
Calcium: 9.3 mg/dL (ref 8.9–10.3)
Chloride: 103 mmol/L (ref 98–111)
Creatinine, Ser: 0.95 mg/dL (ref 0.44–1.00)
GFR calc Af Amer: >60 mL/min (ref >60)
GFR calc non Af Amer: >60 mL/min (ref >60)
Glucose, Bld: 135 mg/dL — ABNORMAL HIGH (ref 70–99)
Potassium: 4.6 mmol/L (ref 3.5–5.1)
Sodium: 137 mmol/L (ref 135–145)
Total Bilirubin: 0.6 mg/dL (ref 0.3–1.2)
Total Protein: 7.2 g/dL (ref 6.5–8.1)

## 2019-08-10 LAB — CREATININE, SERUM
Creatinine, Ser: 0.95 mg/dL (ref 0.44–1.00)
GFR calc Af Amer: >60 mL/min (ref >60)
GFR calc non Af Amer: >60 mL/min (ref >60)

## 2019-08-10 LAB — HIV ANTIBODY (ROUTINE TESTING W REFLEX): HIV Screen 4th Generation wRfx: NONREACTIVE

## 2019-08-10 LAB — MAGNESIUM: Magnesium: 2.1 mg/dL (ref 1.7–2.4)

## 2019-08-10 LAB — PHOSPHORUS: Phosphorus: 3.9 mg/dL (ref 2.5–4.6)

## 2019-08-10 LAB — LIPASE, BLOOD: Lipase: 21 U/L (ref 11–51)

## 2019-08-10 LAB — TSH: TSH: 0.383 u[IU]/mL (ref 0.350–4.500)

## 2019-08-10 LAB — SARS CORONAVIRUS 2 BY RT PCR (HOSPITAL ORDER, PERFORMED IN ~~LOC~~ HOSPITAL LAB): SARS Coronavirus 2: NEGATIVE

## 2019-08-10 IMAGING — CT CT ABD-PELV W/ CM
2 of 5 series · 15 of 46 positions shown, 17 images · IV contrast (APPLIED)
Comparison: CT scan [DATE]

CLINICAL DATA: Abdominal pain and vomiting.

EXAM:
CT ABDOMEN AND PELVIS WITH CONTRAST
TECHNIQUE: Multidetector CT imaging of the abdomen and pelvis was performed
using the standard protocol following bolus administration of
intravenous contrast.
CONTRAST:  100mL OMNIPAQUE IOHEXOL 300 MG/ML  SOLN

[Series 3: abdomen 5.0 · axial · 0.77mm/px · z∈[-572,-162]mm · 12 of 94 slices shown, 14 images]
[im 6/94  soft-tissue]
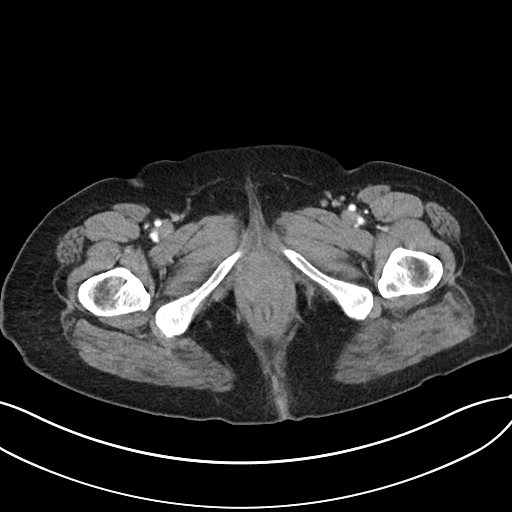
[im 6/94  bone]
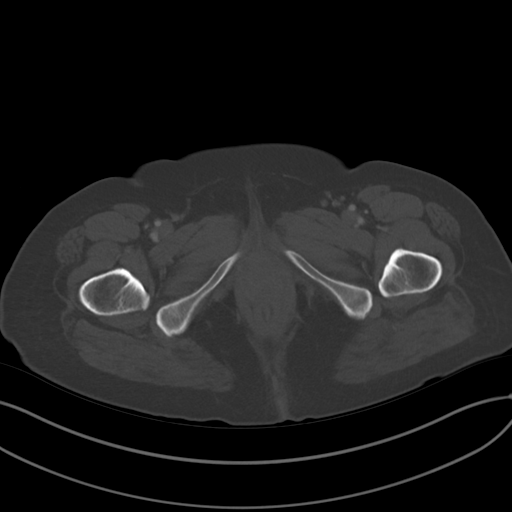
[im 12/94  soft-tissue]
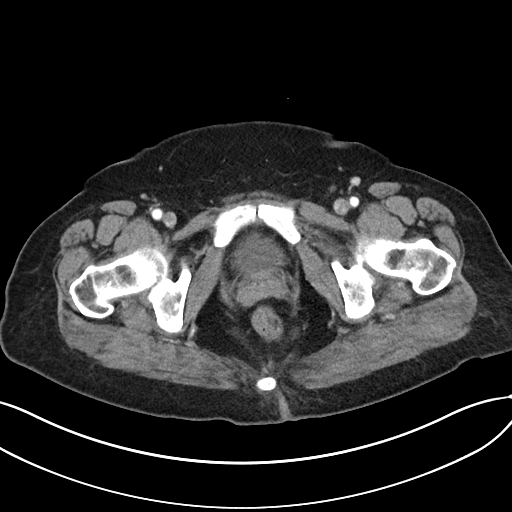
[im 24/94  soft-tissue]
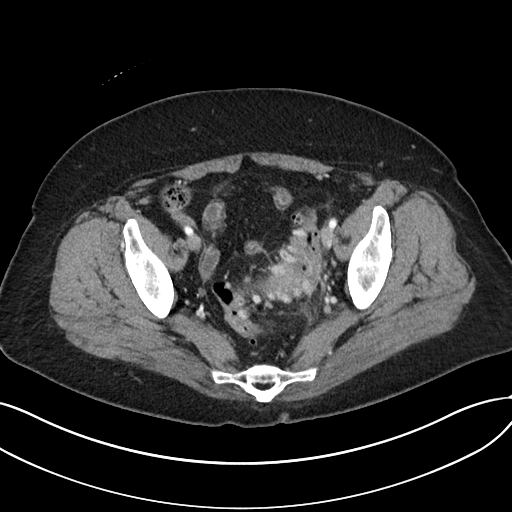
[im 30/94  soft-tissue]
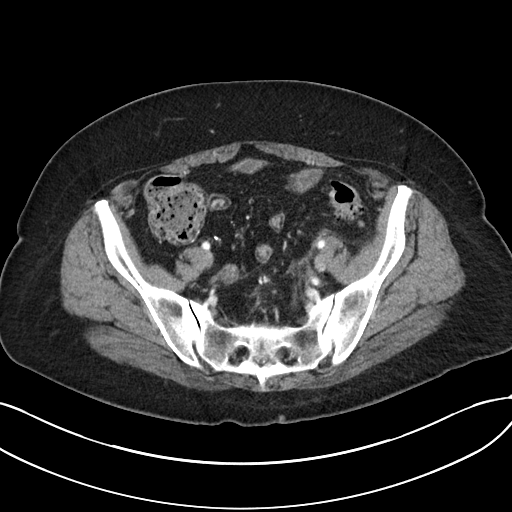
[im 35/94  soft-tissue]
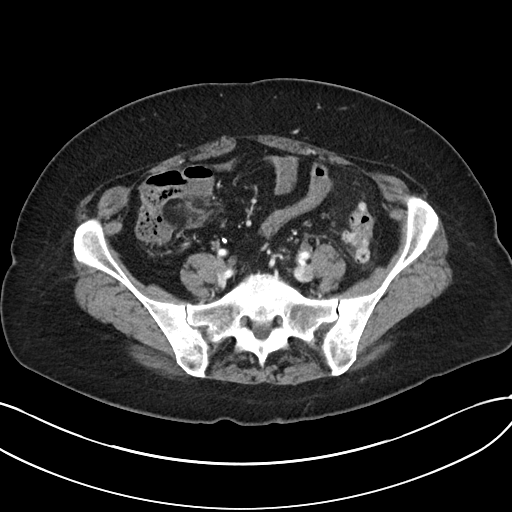
[im 41/94  soft-tissue]
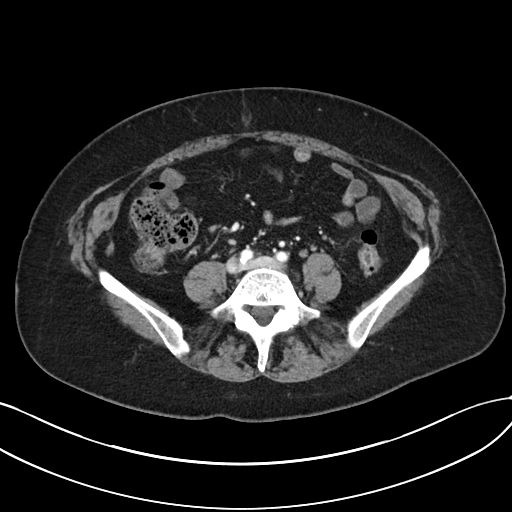
[im 53/94  soft-tissue]
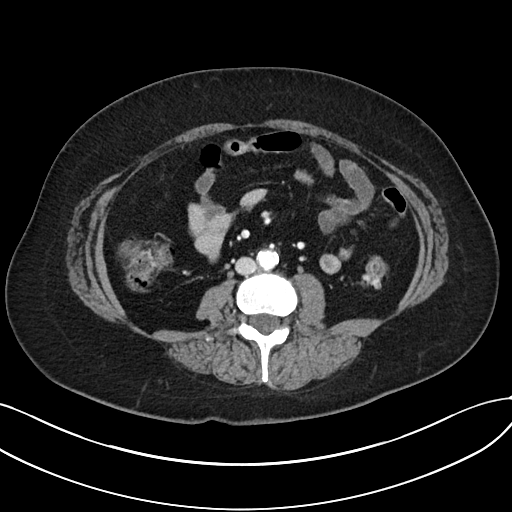
[im 59/94  soft-tissue]
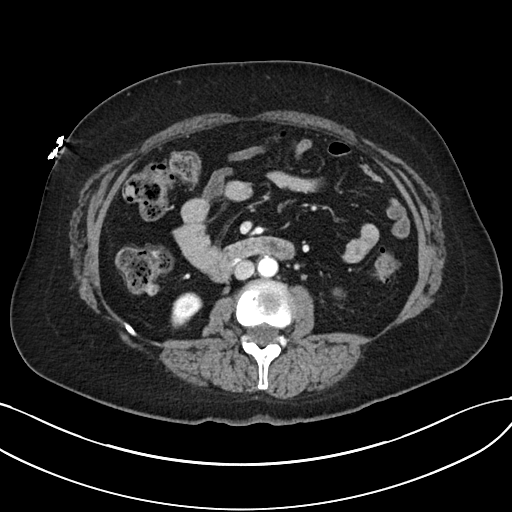
[im 64/94  soft-tissue]
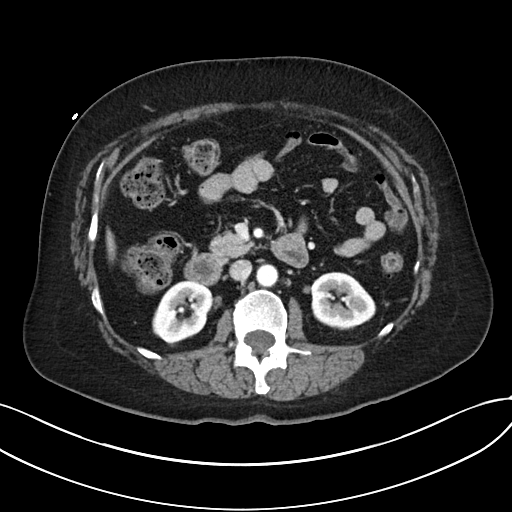
[im 64/94  bone]
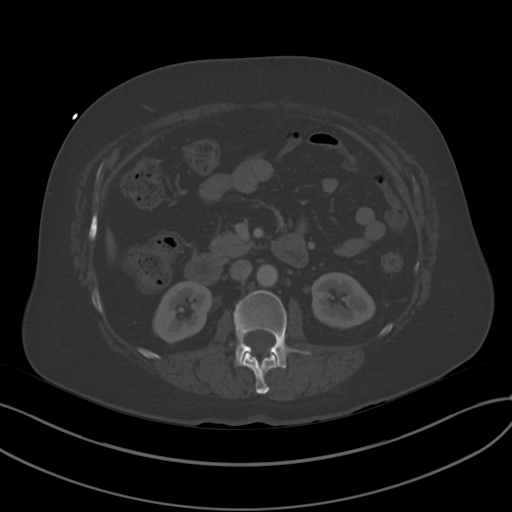
[im 70/94  soft-tissue]
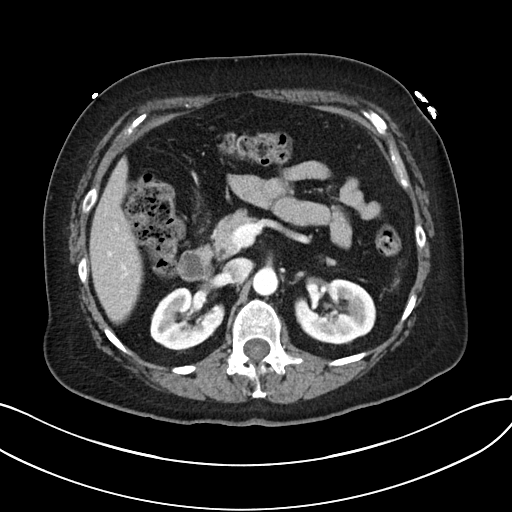
[im 82/94  soft-tissue]
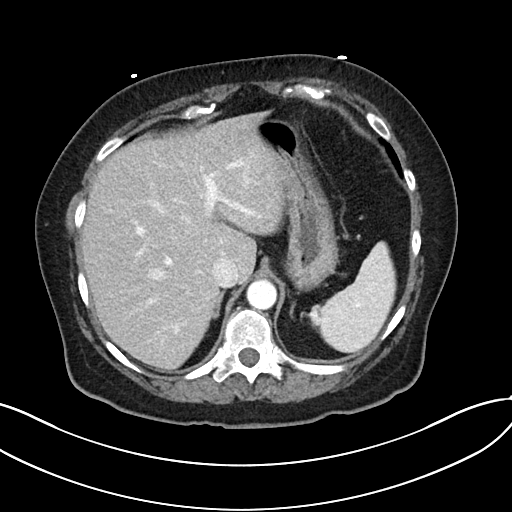
[im 88/94  soft-tissue]
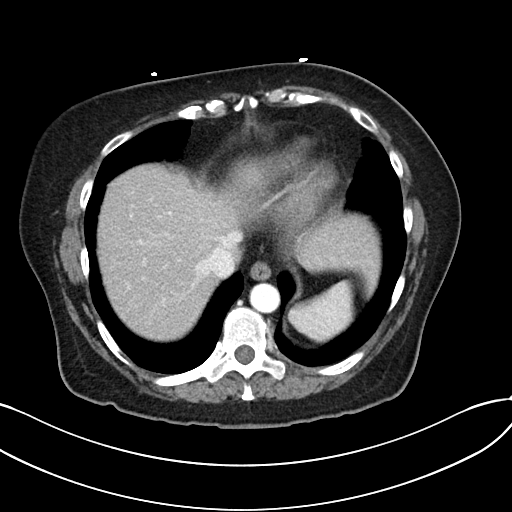

[Series 6: abdomen 3.0 mpr cor · coronal · 0.74mm/px · 3 of 84 slices shown]
[im 28/84  soft-tissue]
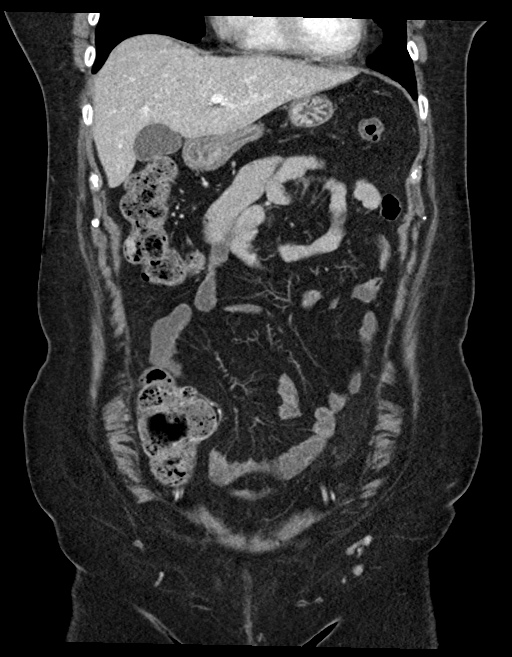
[im 37/84  soft-tissue]
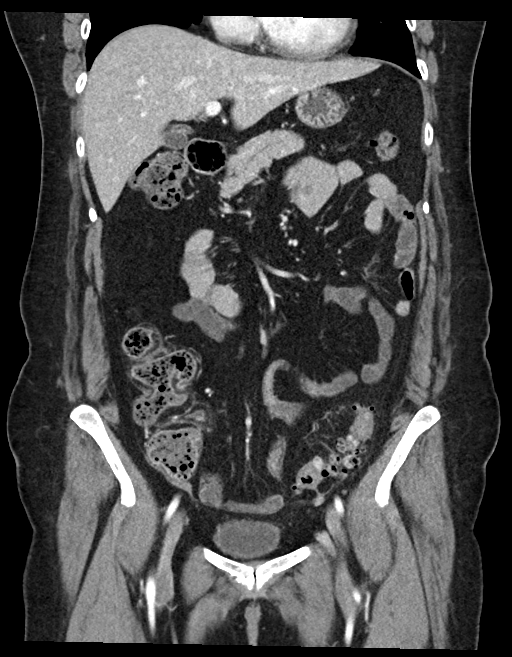
[im 47/84  soft-tissue]
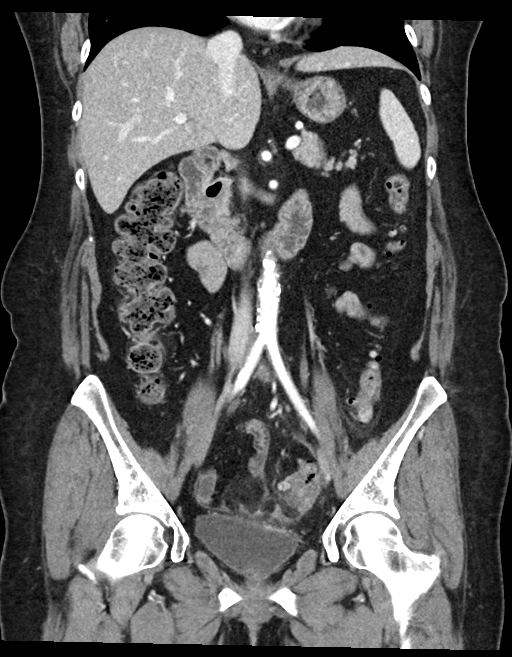

[15 of 46 positions shown; findings below may reference images not displayed]

FINDINGS: Lower chest: The lung bases are clear of acute process. No pleural
effusion or pulmonary lesions. The heart is normal in size. No
pericardial effusion. The distal esophagus and aorta are
unremarkable.

Hepatobiliary: No focal hepatic lesions or intrahepatic biliary
dilatation. The gallbladder appears normal. No common bile duct
dilatation. The portal and hepatic veins are patent.

Pancreas: No mass, inflammation or ductal dilatation. Small duodenal
diverticulum noted near the pancreatic head.

Spleen: Small calcified granulomas but no mass. Normal size.

Adrenals/Urinary Tract: The adrenal glands and kidneys are
unremarkable. The bladder is unremarkable.

Stomach/Bowel: The stomach, duodenum and small bowel are
unremarkable. No acute inflammatory changes, mass lesions or
obstructive findings. The terminal ileum is normal. The appendix is
surgically absent.

Changes of acute uncomplicated diverticulitis involving the mid
sigmoid colon. Severe sigmoid colon diverticulosis. No abscess or
free air. Stable significant diverticulosis involving the descending
colon also.

Vascular/Lymphatic: Age advanced atherosclerotic calcifications
involving the aorta and iliac arteries but no aneurysm or
dissection. The branch vessels are patent. The major venous
structures are patent. No mesenteric or retroperitoneal mass or
adenopathy.

Reproductive: Surgically absent.

Other: No pelvic mass or adenopathy. No free pelvic fluid
collections. No inguinal mass or adenopathy. No abdominal wall
hernia or subcutaneous lesions.

Musculoskeletal: No significant bony findings.
IMPRESSION: 1. Acute uncomplicated diverticulitis involving the mid sigmoid
colon.
2. No other significant abdominal/pelvic findings, mass lesions or
adenopathy.
3. Age advanced atherosclerotic calcifications involving the aorta
and iliac arteries.

Aortic Atherosclerosis ([IF]-[IF]).

## 2019-08-10 MED ORDER — FENTANYL CITRATE (PF) 100 MCG/2ML IJ SOLN
12.5000 ug | INTRAMUSCULAR | Status: DC | PRN
  Administered 2019-08-10 – 2019-08-12 (×5): 50 ug via INTRAVENOUS
  Filled 2019-08-10 (×5): qty 2

## 2019-08-10 MED ORDER — ACETAMINOPHEN 650 MG RE SUPP: 650.0000 mg | Freq: Four times a day (QID) | RECTAL | Status: DC | PRN

## 2019-08-10 MED ORDER — TIZANIDINE HCL 2 MG PO TABS
4.0000 mg | ORAL_TABLET | Freq: Every evening | ORAL | Status: DC | PRN
  Administered 2019-08-12: 4 mg via ORAL
  Filled 2019-08-10: qty 2

## 2019-08-10 MED ORDER — LEVOTHYROXINE SODIUM 100 MCG PO TABS
100.0000 ug | ORAL_TABLET | Freq: Every day | ORAL | Status: DC
  Filled 2019-08-10: qty 1

## 2019-08-10 MED ORDER — PANTOPRAZOLE SODIUM 40 MG PO TBEC
40.0000 mg | DELAYED_RELEASE_TABLET | Freq: Every day | ORAL | Status: DC
  Administered 2019-08-11 – 2019-08-14 (×4): 40 mg via ORAL
  Filled 2019-08-10 (×4): qty 1

## 2019-08-10 MED ORDER — PRAVASTATIN SODIUM 10 MG PO TABS
10.0000 mg | ORAL_TABLET | Freq: Every day | ORAL | Status: DC
  Administered 2019-08-11 – 2019-08-14 (×4): 10 mg via ORAL
  Filled 2019-08-10 (×5): qty 1

## 2019-08-10 MED ORDER — ONDANSETRON HCL 4 MG/2ML IJ SOLN
4.0000 mg | Freq: Once | INTRAMUSCULAR | Status: AC
  Administered 2019-08-10: 4 mg via INTRAVENOUS
  Filled 2019-08-10: qty 2

## 2019-08-10 MED ORDER — FENTANYL CITRATE (PF) 100 MCG/2ML IJ SOLN
50.0000 ug | Freq: Once | INTRAMUSCULAR | Status: AC
  Administered 2019-08-10: 50 ug via INTRAVENOUS
  Filled 2019-08-10: qty 2

## 2019-08-10 MED ORDER — SODIUM CHLORIDE 0.9% FLUSH
3.0000 mL | Freq: Once | INTRAVENOUS | Status: AC
  Administered 2019-08-10: 3 mL via INTRAVENOUS

## 2019-08-10 MED ORDER — PIPERACILLIN-TAZOBACTAM 3.375 G IVPB 30 MIN
3.3750 g | Freq: Once | INTRAVENOUS | Status: AC
  Administered 2019-08-10: 3.375 g via INTRAVENOUS
  Filled 2019-08-10: qty 50

## 2019-08-10 MED ORDER — SODIUM CHLORIDE 0.9 % IV SOLN: 3.0000 g | Freq: Once | INTRAVENOUS | Status: DC

## 2019-08-10 MED ORDER — ENOXAPARIN SODIUM 40 MG/0.4ML ~~LOC~~ SOLN
40.0000 mg | SUBCUTANEOUS | Status: DC
  Administered 2019-08-11 – 2019-08-13 (×3): 40 mg via SUBCUTANEOUS
  Filled 2019-08-10 (×3): qty 0.4

## 2019-08-10 MED ORDER — ONDANSETRON HCL 4 MG/2ML IJ SOLN
4.0000 mg | Freq: Four times a day (QID) | INTRAMUSCULAR | Status: DC | PRN
  Administered 2019-08-10 – 2019-08-12 (×5): 4 mg via INTRAVENOUS
  Filled 2019-08-10 (×5): qty 2

## 2019-08-10 MED ORDER — DOXEPIN HCL 25 MG PO CAPS
25.0000 mg | ORAL_CAPSULE | Freq: Every day | ORAL | Status: DC
  Administered 2019-08-10 – 2019-08-13 (×4): 25 mg via ORAL
  Filled 2019-08-10 (×4): qty 1

## 2019-08-10 MED ORDER — ACETAMINOPHEN 325 MG PO TABS
650.0000 mg | ORAL_TABLET | Freq: Four times a day (QID) | ORAL | Status: DC | PRN
  Administered 2019-08-10 – 2019-08-13 (×4): 650 mg via ORAL
  Filled 2019-08-10 (×4): qty 2

## 2019-08-10 MED ORDER — SODIUM CHLORIDE 0.9 % IV SOLN: INTRAVENOUS | Status: DC

## 2019-08-10 MED ORDER — PIPERACILLIN-TAZOBACTAM 3.375 G IVPB
3.3750 g | Freq: Three times a day (TID) | INTRAVENOUS | Status: DC
  Administered 2019-08-10 – 2019-08-11 (×3): 3.375 g via INTRAVENOUS
  Filled 2019-08-10 (×3): qty 50

## 2019-08-10 MED ORDER — IOHEXOL 300 MG/ML  SOLN
100.0000 mL | Freq: Once | INTRAMUSCULAR | Status: AC | PRN
  Administered 2019-08-10: 100 mL via INTRAVENOUS

## 2019-08-10 MED ORDER — LORAZEPAM 1 MG PO TABS
1.0000 mg | ORAL_TABLET | Freq: Three times a day (TID) | ORAL | Status: DC | PRN
  Administered 2019-08-10 – 2019-08-13 (×6): 1 mg via ORAL
  Filled 2019-08-10 (×2): qty 1
  Filled 2019-08-10: qty 2
  Filled 2019-08-10 (×3): qty 1

## 2019-08-10 MED ORDER — LEVOTHYROXINE SODIUM 100 MCG PO TABS
100.0000 ug | ORAL_TABLET | Freq: Every day | ORAL | Status: DC
  Administered 2019-08-11 – 2019-08-14 (×4): 100 ug via ORAL
  Filled 2019-08-10 (×4): qty 1

## 2019-08-10 MED ORDER — SODIUM CHLORIDE 0.9 % IV BOLUS
500.0000 mL | Freq: Once | INTRAVENOUS | Status: AC
  Administered 2019-08-10: 500 mL via INTRAVENOUS

## 2019-08-10 NOTE — ED Notes (Signed)
Called CT re labs.

## 2019-08-10 NOTE — Telephone Encounter (Signed)
I spoke to the patient husband Araceli Bouche who is on the DPR and he informed me that his wife is in the ER right now. I informed him whenever she is ready to get her MRI schedule to give me a call and I gave him our number of [phone] .

## 2019-08-10 NOTE — ED Notes (Signed)
Called lab to check on labs.  

## 2019-08-10 NOTE — ED Notes (Signed)
RN attempted report x1.  

## 2019-08-10 NOTE — ED Triage Notes (Signed)
Pt reports lower abd pain with constipation, hx of diverticulitis, last episode in may. Denies diarrhea, endorses some nausea, with an episode of vomiting last night.

## 2019-08-10 NOTE — H&P (Addendum)
History and Physical    Kathleen Sweeney DPO:[id number] DOB: 02-May-1960 DOA: 08/10/2019  PCP: Maurice Small, MD  Patient coming from: Home I have personally briefly reviewed patient's old medical records in [place]  Chief Complaint: Abdominal pain since 4 days and vomiting since 1 day  HPI: Kathleen Sweeney is a 59 y.o. female with medical history significant of depression/anxiety, GERD, hyperlipidemia, hypothyroidism presents to emergency department due to worsening abdominal pain associated with vomiting.  Patient tells me that she came to ER on  5/10 with left lower quadrant pain.  CT abdomen shows acute sigmoid diverticulitis without acute complication.  Patient discharged home on Cipro and Flagyl.  Patient tells me that she finished Cipro as prescribed however she could not take Flagyl due to bad taste.  She tells me that she could not tolerate Flagyl.  Her symptoms improved however since Thursday she started having left lower quadrant pain, severe in intensity, 9 out of 10, nonradiating, no aggravating or relieving factors, associated with nonbloody vomiting.  She tells me that she vomited twice since last night. Reports decreased appetite however denies diarrhea, melena, fever, chills, chest pain, shortness of breath, palpitation, leg swelling, weakness or lethargy.    No history of smoking, alcohol, illicit drug use.  She is not allergic to penicillin.  Of note: Patient recently evaluated by neurology outpatient for ptosis and weakness-concern for myasthenia gravis.  She had nerve conduction study on 6/17 which came back negative.  ED Course: Upon arrival to ED: Patient vital signs stable, afebrile with no leukocytosis.  Initial lab such as CBC, CMP, lipase, UA: WNL.  COVID-19 pending.  CT abdomen/pelvis shows acute uncomplicated sigmoid diverticulitis.  Patient received IV fluids, Zofran, fentanyl, Unasyn in ED.  [facility] hospitalist consulted for admission for persistent  diverticulitis.  Review of Systems: As per HPI otherwise negative.    Past Medical History:  Diagnosis Date  . CKD (chronic kidney disease)   . Depression   . Facial droop   . Fibromyalgia   . Hyperlipidemia   . Hypertension   . Hypothyroid   . IBS (irritable bowel syndrome)   . Insomnia   . Lupus ([place])   . Migraine   . RLS (restless legs syndrome)   . Skin cancer Had cancer removed in 2000  . Thyroid disease     Past Surgical History:  Procedure Laterality Date  . ABDOMINAL HYSTERECTOMY  2008  . APPENDECTOMY  1998  . thoracic tumor  03/1995   excision     reports that she quit smoking about 13 years ago. Her smoking use included cigarettes. She has a 60.00 pack-year smoking history. She has never used smokeless tobacco. She reports previous drug use. Drugs: Marijuana and Other-see comments. She reports that she does not drink alcohol.  Allergies  Allergen Reactions  . Erythromycin Anaphylaxis    Chest pain  . Morphine And Related Nausea And Vomiting    Not given  . Penicillins Other (See Comments)    Family allergy.-- sister had an allergic reaction and the doctor told her that the whole family would have it. Never actually taken medication.  Has patient had a PCN reaction causing immediate rash, facial/tongue/throat swelling, SOB or lightheadedness with hypotension: No answer  Has patient had a PCN reaction causing severe rash involving mucus membranes or skin necrosis:No answer  Has patient had a PCN reaction that required hospitalization no answer  Has patient had a PCN reaction occurring wit  . Promethazine Nausea  And Vomiting    "made her sick"  . Simvastatin Other (See Comments)    Muscle aches    Family History  Problem Relation Age of Onset  . Sleep apnea Mother   . Irregular heart beat Mother        A fib  . Hypertension Mother   . Diabetes Mother   . Dementia Mother   . Pneumonia Father   . Sleep apnea Brother   . Dementia Maternal Grandmother       Prior to Admission medications   Medication Sig Start Date End Date Taking? Authorizing Provider  acetaminophen (TYLENOL) 325 MG tablet Take 325 mg by mouth 2 (two) times daily as needed for mild pain.    [provider]  doxepin (SINEQUAN) 25 MG capsule Take 25 mg by mouth at bedtime.    [provider]  levothyroxine (SYNTHROID) 100 MCG tablet Take 100 mcg by mouth daily. 09/26/18   [provider]  LORazepam (ATIVAN) 1 MG tablet Take 1 mg by mouth 3 (three) times daily as needed for anxiety or sleep.  05/21/19   [provider]  omeprazole (PRILOSEC) 20 MG capsule Take 20 mg by mouth daily as needed.     [provider]  pravastatin (PRAVACHOL) 10 MG tablet Take 10 mg by mouth daily.    [provider]  Probiotic CAPS Take 1 capsule by mouth daily. 06/22/19   Ward, Delice Bison, DO  tiZANidine (ZANAFLEX) 4 MG tablet Take 4 mg by mouth at bedtime as needed for muscle spasms.  09/15/18   [provider]  zolpidem (AMBIEN CR) 12.5 MG CR tablet Take 12.5 mg by mouth at bedtime as needed for sleep. 03/11/19   [provider]    Physical Exam: Vitals:   08/10/19 0654 08/10/19 0830 08/10/19 0845 08/10/19 0900  BP: 127/90 133/87  123/84  Pulse: (!) 102 82 86 86  Resp: 18 19 (!) 21 16  Temp: 98.4 F (36.9 C)     TempSrc: Oral     SpO2: 97% 95% (!) 87% 94%    Constitutional: NAD, calm, comfortable, communicating well, on room air Eyes: PERRL, lids and conjunctivae normal ENMT: Mucous membranes are moist. Posterior pharynx clear of any exudate or lesions.Normal dentition.  Neck: normal, supple, no masses, no thyromegaly Respiratory: clear to auscultation bilaterally, no wheezing, no crackles. Normal respiratory effort. No accessory muscle use.  Cardiovascular: Regular rate and rhythm, no murmurs / rubs / gallops. No extremity edema. 2+ pedal pulses. No carotid bruits.  Abdomen: Generalized abdominal tenderness positive, no  guarding, no rigidity no masses palpated. No hepatosplenomegaly. Bowel sounds positive.  Musculoskeletal: no clubbing / cyanosis. No joint deformity upper and lower extremities. Good ROM, no contractures. Normal muscle tone.  Skin: no rashes, lesions, ulcers. No induration Neurologic: CN 2-12 grossly intact. Sensation intact, DTR normal. Strength 5/5 in all 4.  Psychiatric: Normal judgment and insight. Alert and oriented x 3. Normal mood.    Labs on Admission: I have personally reviewed following labs and imaging studies  CBC: Recent Labs  Lab 08/10/19 0800  WBC 10.3  HGB 13.8  HCT 43.8  MCV 86.6  PLT 470   Basic Metabolic Panel: Recent Labs  Lab 08/10/19 0800  NA 137  K 4.6  CL 103  CO2 24  GLUCOSE 135*  BUN 9  CREATININE 0.95  CALCIUM 9.3   GFR: CrCl cannot be calculated (Unknown ideal weight.). Liver Function Tests: Recent Labs  Lab 08/10/19 0800  AST 24  ALT 16  ALKPHOS 86  BILITOT 0.6  PROT 7.2  ALBUMIN 3.6   Recent Labs  Lab 08/10/19 0800  LIPASE 21   No results for input(s): AMMONIA in the last 168 hours. Coagulation Profile: No results for input(s): INR, PROTIME in the last 168 hours. Cardiac Enzymes: No results for input(s): CKTOTAL, CKMB, CKMBINDEX, TROPONINI in the last 168 hours. BNP (last 3 results) No results for input(s): PROBNP in the last 8760 hours. HbA1C: No results for input(s): HGBA1C in the last 72 hours. CBG: No results for input(s): GLUCAP in the last 168 hours. Lipid Profile: No results for input(s): CHOL, HDL, LDLCALC, TRIG, CHOLHDL, LDLDIRECT in the last 72 hours. Thyroid Function Tests: No results for input(s): TSH, T4TOTAL, FREET4, T3FREE, THYROIDAB in the last 72 hours. Anemia Panel: No results for input(s): VITAMINB12, FOLATE, FERRITIN, TIBC, IRON, RETICCTPCT in the last 72 hours. Urine analysis:    Component Value Date/Time   COLORURINE YELLOW 08/10/2019 0820   APPEARANCEUR CLEAR 08/10/2019 0820   LABSPEC 1.011  08/10/2019 0820   PHURINE 6.0 08/10/2019 0820   GLUCOSEU NEGATIVE 08/10/2019 0820   HGBUR NEGATIVE 08/10/2019 0820   BILIRUBINUR NEGATIVE 08/10/2019 0820   KETONESUR NEGATIVE 08/10/2019 0820   PROTEINUR NEGATIVE 08/10/2019 0820   UROBILINOGEN 0.2 08/16/2014 1820   NITRITE NEGATIVE 08/10/2019 0820   LEUKOCYTESUR NEGATIVE 08/10/2019 0820    Radiological Exams on Admission: CT ABDOMEN PELVIS W CONTRAST  Result Date: 08/10/2019 CLINICAL DATA:  Abdominal pain and vomiting. EXAM: CT ABDOMEN AND PELVIS WITH CONTRAST TECHNIQUE: Multidetector CT imaging of the abdomen and pelvis was performed using the standard protocol following bolus administration of intravenous contrast. CONTRAST:  151mL OMNIPAQUE IOHEXOL 300 MG/ML  SOLN COMPARISON:  CT scan 06/22/2019 FINDINGS: Lower chest: The lung bases are clear of acute process. No pleural effusion or pulmonary lesions. The heart is normal in size. No pericardial effusion. The distal esophagus and aorta are unremarkable. Hepatobiliary: No focal hepatic lesions or intrahepatic biliary dilatation. The gallbladder appears normal. No common bile duct dilatation. The portal and hepatic veins are patent. Pancreas: No mass, inflammation or ductal dilatation. Small duodenal diverticulum noted near the pancreatic head. Spleen: Small calcified granulomas but no mass. Normal size. Adrenals/Urinary Tract: The adrenal glands and kidneys are unremarkable. The bladder is unremarkable. Stomach/Bowel: The stomach, duodenum and small bowel are unremarkable. No acute inflammatory changes, mass lesions or obstructive findings. The terminal ileum is normal. The appendix is surgically absent. Changes of acute uncomplicated diverticulitis involving the mid sigmoid colon. Severe sigmoid colon diverticulosis. No abscess or free air. Stable significant diverticulosis involving the descending colon also. Vascular/Lymphatic: Age advanced atherosclerotic calcifications involving the aorta and  iliac arteries but no aneurysm or dissection. The branch vessels are patent. The major venous structures are patent. No mesenteric or retroperitoneal mass or adenopathy. Reproductive: Surgically absent. Other: No pelvic mass or adenopathy. No free pelvic fluid collections. No inguinal mass or adenopathy. No abdominal wall hernia or subcutaneous lesions. Musculoskeletal: No significant bony findings. IMPRESSION: 1. Acute uncomplicated diverticulitis involving the mid sigmoid colon. 2. No other significant abdominal/pelvic findings, mass lesions or adenopathy. 3. Age advanced atherosclerotic calcifications involving the aorta and iliac arteries. Aortic Atherosclerosis (ICD10-I70.0). Electronically Signed   By: Marijo Sanes M.D.   On: 08/10/2019 13:01    Assessment/Plan Principal Problem:   Diverticulitis Active Problems:   Hypothyroid   Depression   Hypertension   Hyperlipidemia   Insomnia   Acute diverticulitis    Persistent sigmoid  diverticulitis: -Failed outpatient treatment with Cipro and Flagyl.  Patient presented with abdominal pain with vomiting.  She is afebrile with no leukocytosis.  Initial labs such as CBC, CMP, lipase, UA: Within normal limit.  Reviewed CT abdomen/pelvis result.   -Admit patient on the floor. -Pt does not want to be started on Flagyl. -Start Zosyn.  Start on IV fluids.  Monitor vitals closely.  We will keep her n.p.o. -Tylenol/fentanyl as needed for pain control.  Zofran as needed for nausea and vomiting.  Hypothyroidism: Check TSH -Continue levothyroxine  Hyperlipidemia: Continue statin  Depression/anxiety/insomnia: Continue home meds-doxepin and Xanax  GERD: Continue PPI  DVT prophylaxis: Lovenox/SCD Code Status: Full code Family Communication: None present at bedside.  Plan of care discussed with patient in length and he verbalized understanding and agreed with it. Disposition Plan: Likely home in 2 days Consults called: None Admission status:  Inpatient   Mckinley Jewel MD [facility]  If 7PM-7AM, please contact night-coverage [email or web address] Password [email or web address]  08/10/2019, 1:46 PM

## 2019-08-10 NOTE — ED Provider Notes (Signed)
[facility] EMERGENCY DEPARTMENT Provider Note   CSN: [id number] Arrival date & time: 08/10/19  3818     History Chief Complaint  Patient presents with  . Abdominal Pain    Kathleen Sweeney is a 59 y.o. female.  HPI Patient presents with left lower suprapubic abdominal pain.  Has had for the last few days.  History of diverticulitis and was recently treated.  Diagnosed on May 8.  That time.  Had been doing better.  Although she did not tolerate the metronidazole she been on.  No fevers but has now had nausea and vomiting.  Pain returned.  Mild constipation.  States this feels like recent diverticulitis.    Past Medical History:  Diagnosis Date  . CKD (chronic kidney disease)   . Depression   . Facial droop   . Fibromyalgia   . Hyperlipidemia   . Hypertension   . Hypothyroid   . IBS (irritable bowel syndrome)   . Insomnia   . Lupus ([place])   . Migraine   . RLS (restless legs syndrome)   . Skin cancer Had cancer removed in 2000  . Thyroid disease     Patient Active Problem List   Diagnosis Date Noted  . Nausea & vomiting 04/27/2011  . Migraine   . Thyroid disease   . Fibromyalgia   . Depression     Past Surgical History:  Procedure Laterality Date  . ABDOMINAL HYSTERECTOMY  2008  . APPENDECTOMY  1998  . thoracic tumor  03/1995   excision     OB History   No obstetric history on file.     Family History  Problem Relation Age of Onset  . Sleep apnea Mother   . Irregular heart beat Mother        A fib  . Hypertension Mother   . Diabetes Mother   . Dementia Mother   . Pneumonia Father   . Sleep apnea Brother   . Dementia Maternal Grandmother     Social History   Tobacco Use  . Smoking status: Former Smoker    Packs/day: 2.00    Years: 30.00    Pack years: 60.00    Types: Cigarettes    Quit date: 06/23/2006    Years since quitting: 13.1  . Smokeless tobacco: Never Used  Vaping Use  . Vaping Use: Never used  Substance Use  Topics  . Alcohol use: No  . Drug use: Not Currently    Types: Marijuana, Other-see comments    Comment: CBD oil, last used 2020    Home Medications Prior to Admission medications   Medication Sig Start Date End Date Taking? Authorizing Provider  acetaminophen (TYLENOL) 325 MG tablet Take 325 mg by mouth 2 (two) times daily as needed for mild pain.    [provider]  doxepin (SINEQUAN) 25 MG capsule Take 25 mg by mouth at bedtime.    [provider]  levothyroxine (SYNTHROID) 100 MCG tablet Take 100 mcg by mouth daily. 09/26/18   [provider]  LORazepam (ATIVAN) 1 MG tablet Take 1 mg by mouth 3 (three) times daily as needed for anxiety or sleep.  05/21/19   [provider]  omeprazole (PRILOSEC) 20 MG capsule Take 20 mg by mouth daily as needed.     [provider]  pravastatin (PRAVACHOL) 10 MG tablet Take 10 mg by mouth daily.    [provider]  Probiotic CAPS Take 1 capsule by mouth daily. 06/22/19  Ward, Delice Bison, DO  tiZANidine (ZANAFLEX) 4 MG tablet Take 4 mg by mouth at bedtime as needed for muscle spasms.  09/15/18   [provider]  zolpidem (AMBIEN CR) 12.5 MG CR tablet Take 12.5 mg by mouth at bedtime as needed for sleep. 03/11/19   [provider]    Allergies    Erythromycin, Morphine and related, Penicillins, Promethazine, and Simvastatin  Review of Systems   Review of Systems  Constitutional: Positive for appetite change. Negative for fever.  HENT: Negative for congestion.   Respiratory: Negative for shortness of breath.   Cardiovascular: Negative for chest pain.  Gastrointestinal: Positive for abdominal pain, nausea and vomiting.  Genitourinary: Negative for flank pain.  Musculoskeletal: Negative for back pain.  Skin: Negative for rash.  Neurological: Negative for weakness.    Physical Exam Updated Vital Signs BP 123/84   Pulse 86   Temp 98.4 F (36.9 C) (Oral)   Resp 16   SpO2 94%    Physical Exam Vitals and nursing note reviewed.  HENT:     Head: Normocephalic.  Cardiovascular:     Rate and Rhythm: Regular rhythm.  Pulmonary:     Breath sounds: Normal breath sounds.  Abdominal:     Comments: Moderate suprapubic to left lower quadrant tenderness.  No hernia.  No rebound.  Skin:    General: Skin is warm.  Neurological:     Mental Status: She is alert and oriented to person, place, and time.     ED Results / Procedures / Treatments   Labs (all labs ordered are listed, but only abnormal results are displayed) Labs Reviewed  COMPREHENSIVE METABOLIC PANEL - Abnormal; Notable for the following components:      Result Value   Glucose, Bld 135 (*)    All other components within normal limits  LIPASE, BLOOD  CBC  URINALYSIS, ROUTINE W REFLEX MICROSCOPIC    EKG None  Radiology CT ABDOMEN PELVIS W CONTRAST  Result Date: 08/10/2019 CLINICAL DATA:  Abdominal pain and vomiting. EXAM: CT ABDOMEN AND PELVIS WITH CONTRAST TECHNIQUE: Multidetector CT imaging of the abdomen and pelvis was performed using the standard protocol following bolus administration of intravenous contrast. CONTRAST:  130mL OMNIPAQUE IOHEXOL 300 MG/ML  SOLN COMPARISON:  CT scan 06/22/2019 FINDINGS: Lower chest: The lung bases are clear of acute process. No pleural effusion or pulmonary lesions. The heart is normal in size. No pericardial effusion. The distal esophagus and aorta are unremarkable. Hepatobiliary: No focal hepatic lesions or intrahepatic biliary dilatation. The gallbladder appears normal. No common bile duct dilatation. The portal and hepatic veins are patent. Pancreas: No mass, inflammation or ductal dilatation. Small duodenal diverticulum noted near the pancreatic head. Spleen: Small calcified granulomas but no mass. Normal size. Adrenals/Urinary Tract: The adrenal glands and kidneys are unremarkable. The bladder is unremarkable. Stomach/Bowel: The stomach, duodenum and small bowel are  unremarkable. No acute inflammatory changes, mass lesions or obstructive findings. The terminal ileum is normal. The appendix is surgically absent. Changes of acute uncomplicated diverticulitis involving the mid sigmoid colon. Severe sigmoid colon diverticulosis. No abscess or free air. Stable significant diverticulosis involving the descending colon also. Vascular/Lymphatic: Age advanced atherosclerotic calcifications involving the aorta and iliac arteries but no aneurysm or dissection. The branch vessels are patent. The major venous structures are patent. No mesenteric or retroperitoneal mass or adenopathy. Reproductive: Surgically absent. Other: No pelvic mass or adenopathy. No free pelvic fluid collections. No inguinal mass or adenopathy. No abdominal wall hernia or subcutaneous  lesions. Musculoskeletal: No significant bony findings. IMPRESSION: 1. Acute uncomplicated diverticulitis involving the mid sigmoid colon. 2. No other significant abdominal/pelvic findings, mass lesions or adenopathy. 3. Age advanced atherosclerotic calcifications involving the aorta and iliac arteries. Aortic Atherosclerosis (ICD10-I70.0). Electronically Signed   By: Marijo Sanes M.D.   On: 08/10/2019 13:01    Procedures Procedures (including critical care time)  Medications Ordered in ED Medications  fentaNYL (SUBLIMAZE) injection 50 mcg (has no administration in time range)  sodium chloride flush (NS) 0.9 % injection 3 mL (3 mLs Intravenous Given 08/10/19 0838)  sodium chloride 0.9 % bolus 500 mL (0 mLs Intravenous Stopped 08/10/19 0930)  fentaNYL (SUBLIMAZE) injection 50 mcg (50 mcg Intravenous Given 08/10/19 0837)  ondansetron (ZOFRAN) injection 4 mg (4 mg Intravenous Given 08/10/19 0837)  iohexol (OMNIPAQUE) 300 MG/ML solution 100 mL (100 mLs Intravenous Contrast Given 08/10/19 1255)    ED Course  I have reviewed the triage vital signs and the nursing notes.  Pertinent labs & imaging results that were available  during my care of the patient were reviewed by me and considered in my medical decision making (see chart for details).    MDM Rules/Calculators/A&P                          Patient with abdominal pain.  Had for last few days.  Around 6 weeks ago treated for CT proven diverticulitis.  Labs reassuring but CT scan shows either return or continuation of previous diverticulitis.  Has had nausea and vomiting and pain has required IV narcotics.  I feels the patient benefit from admission to the hospital since she is not tolerating orals and cannot control pain at home.  Did not do well with the Flagyl previously.  Will give Unasyn here with thought of potential transition to Augmentin at home.  Has a listed penicillin allergy but has never had penicillin herself and states the allergy was that her sister had allergy.  I think she is low enough risk that we can try penicillin here.  Patient's primary is with [facility].  Will admit to hospitalist Final Clinical Impression(s) / ED Diagnoses Final diagnoses:  Diverticulitis    Rx / DC Orders ED Discharge Orders    None       Davonna Belling, MD 08/10/19 1336

## 2019-08-10 NOTE — ED Notes (Signed)
Called CT.  They stated they are backed up, but pt is on their list.  Pt notified.

## 2019-08-11 LAB — COMPREHENSIVE METABOLIC PANEL
ALT: 16 U/L (ref 0–44)
AST: 22 U/L (ref 15–41)
Albumin: 3 g/dL — ABNORMAL LOW (ref 3.5–5.0)
Alkaline Phosphatase: 79 U/L (ref 38–126)
Anion gap: 9 (ref 5–15)
BUN: 6 mg/dL — ABNORMAL LOW (ref 8–23)
CO2: 27 mmol/L (ref 22–32)
Calcium: 8.9 mg/dL (ref 8.9–10.3)
Chloride: 104 mmol/L (ref 98–111)
Creatinine, Ser: 1 mg/dL (ref 0.44–1.00)
GFR calc Af Amer: >60 mL/min (ref >60)
GFR calc non Af Amer: >60 mL/min (ref >60)
Glucose, Bld: 98 mg/dL (ref 70–99)
Potassium: 3.7 mmol/L (ref 3.5–5.1)
Sodium: 140 mmol/L (ref 135–145)
Total Bilirubin: 1.3 mg/dL — ABNORMAL HIGH (ref 0.3–1.2)
Total Protein: 6.4 g/dL — ABNORMAL LOW (ref 6.5–8.1)

## 2019-08-11 LAB — CBC
HCT: 39.4 % (ref 36.0–46.0)
Hemoglobin: 12.4 g/dL (ref 12.0–15.0)
MCH: 27.3 pg (ref 26.0–34.0)
MCHC: 31.5 g/dL (ref 30.0–36.0)
MCV: 86.6 fL (ref 80.0–100.0)
Platelets: 242 10*3/uL (ref 150–400)
RBC: 4.55 MIL/uL (ref 3.87–5.11)
RDW: 13 % (ref 11.5–15.5)
WBC: 7.2 10*3/uL (ref 4.0–10.5)
nRBC: 0 % (ref 0.0–0.2)

## 2019-08-11 MED ORDER — BUTALBITAL-APAP-CAFFEINE 50-325-40 MG PO TABS
1.0000 | ORAL_TABLET | Freq: Four times a day (QID) | ORAL | Status: DC | PRN
  Administered 2019-08-11 – 2019-08-13 (×4): 1 via ORAL
  Filled 2019-08-11 (×4): qty 1

## 2019-08-11 MED ORDER — SODIUM CHLORIDE 0.9 % IV SOLN
3.0000 g | Freq: Four times a day (QID) | INTRAVENOUS | Status: DC
  Administered 2019-08-11 – 2019-08-13 (×8): 3 g via INTRAVENOUS
  Filled 2019-08-11: qty 8
  Filled 2019-08-11 (×2): qty 3
  Filled 2019-08-11: qty 8
  Filled 2019-08-11: qty 0.15
  Filled 2019-08-11: qty 8
  Filled 2019-08-11: qty 3
  Filled 2019-08-11: qty 0.15
  Filled 2019-08-11: qty 3
  Filled 2019-08-11: qty 8
  Filled 2019-08-11: qty 3

## 2019-08-11 NOTE — Progress Notes (Signed)
[facility] Progress Note  Patient: Kathleen Sweeney    ZOX:[id number]  DOA: 08/10/2019     Date of Service: the patient was seen and examined on 08/11/2019  Brief hospital course: Presents with abdominal pain found to have diverticulitis.  Past medical history of recent diverticulitis in May, 2021, depression, fibromyalgia, HTN, HLD, hypothyroidism. Currently plan is continue IV antibiotics and gradually advance diet.  Assessment and Plan: 1.  Recurrent sigmoid diverticulitis Treated outpatient with oral Cipro and Flagyl without any improvement. CT of the abdomen is negative for any abscess or perforation. Labs are stable. Patient nausea and vomiting improving. We will treat with IV Unasyn instead of the Zosyn. Advance to clear liquid diet. Pain control for now.  2.  Hypothyroidism Continue Synthroid  3.  HLD Continue statin  4.  Depression, anxiety, insomnia. Continue home medication with Xanax  5.  GERD Continue PPI  Diet: Clear liquid diet DVT Prophylaxis: enoxaparin (LOVENOX) injection 40 mg Start: 08/10/19 1400 SCDs Start: 08/10/19 1346    Advance goals of care discussion: Full code  Family Communication: no family was present at bedside, at the time of interview.   Disposition:  Status is: Inpatient  Remains inpatient appropriate because:IV treatments appropriate due to intensity of illness or inability to take PO   Dispo: The patient is from: Home              Anticipated d/c is to: Home              Anticipated d/c date is: 1 day              Patient currently is not medically stable to d/c.        Subjective: No nausea no vomiting no fever no chills.  Passing gas but no BM.  Physical Exam:  General: Appear in mild distress, no Rash; Oral Mucosa Clear, moist. no Abnormal Neck Mass Or lumps, Conjunctiva normal  Cardiovascular: S1 and S2 Present, no Murmur, Respiratory: good respiratory effort, Bilateral Air entry present and Clear to  Auscultation, nno Crackles, no wheezes Abdomen: Bowel Sound present, Soft and mild tenderness Extremities: no Pedal edema, no calf tenderness Neurology: alert and oriented to time, place, and person affect appropriate. no new focal deficit Gait not checked due to patient safety concerns  Vitals:   08/11/19 0538 08/11/19 1053 08/11/19 1343 08/11/19 1600  BP: (!) 141/90 126/83 (!) 151/95   Pulse: 72 84 83   Resp: 18 18 18    Temp: 98.1 F (36.7 C) 97.8 F (36.6 C)    TempSrc: Oral Oral    SpO2: 96% 97% 99%   Weight:    80.7 kg    Intake/Output Summary (Last 24 hours) at 08/11/2019 1841 Last data filed at 08/11/2019 1700 Gross per 24 hour  Intake 1141.65 ml  Output --  Net 1141.65 ml   Filed Weights   08/11/19 1600  Weight: 80.7 kg    Data Reviewed: I have personally reviewed and interpreted daily labs, tele strips, imagings as discussed above. I reviewed all nursing notes, pharmacy notes, vitals, pertinent old records I have discussed plan of care as described above with RN and patient/family.  CBC: Recent Labs  Lab 08/10/19 0800 08/10/19 1550 08/11/19 0212  WBC 10.3 7.8 7.2  HGB 13.8 13.6 12.4  HCT 43.8 43.1 39.4  MCV 86.6 87.4 86.6  PLT 259 254 811   Basic Metabolic Panel: Recent Labs  Lab 08/10/19 0800 08/10/19 1550 08/11/19 0212  NA 137  --  140  K 4.6  --  3.7  CL 103  --  104  CO2 24  --  27  GLUCOSE 135*  --  98  BUN 9  --  6*  CREATININE 0.95 0.95 1.00  CALCIUM 9.3  --  8.9  MG  --  2.1  --   PHOS  --  3.9  --     Studies: No results found.  Scheduled Meds:  doxepin  25 mg Oral QHS   enoxaparin (LOVENOX) injection  40 mg Subcutaneous Q24H   levothyroxine  100 mcg Oral Q0600   pantoprazole  40 mg Oral Daily   pravastatin  10 mg Oral Daily   Continuous Infusions:  ampicillin-sulbactam (UNASYN) IV 3 g (08/11/19 1657)   PRN Meds: acetaminophen **OR** acetaminophen, butalbital-acetaminophen-caffeine, fentaNYL (SUBLIMAZE) injection,  LORazepam, ondansetron (ZOFRAN) IV, tiZANidine  Time spent: 35 minutes  Author: Berle Mull, MD [facility] 08/11/2019 6:41 PM  To reach On-call, see care teams to locate the attending and reach out via [email or web address]. Between 7PM-7AM, please contact night-coverage If you still have difficulty reaching the attending provider, please page the [facility] (Director on Call) for [facility] on amion for assistance.

## 2019-08-11 NOTE — Progress Notes (Signed)
Pharmacy Antibiotic Note  Kathleen Sweeney is a 59 y.o. female admitted on 08/10/2019 with abdominal pain and vomiting.  Pharmacy has been consulted for Unasyn dosing for intra-abdominal infection.  Renal function stable, afebrile, WBC WNL.  Plan: Unasyn 3gm IV Q6H Pharmacy will sign off as renal function is stable.  Thank you for the consult!  Weight: 80.7 kg (178 lb)  Temp (24hrs), Avg:98.3 F (36.8 C), Min:97.8 F (36.6 C), Max:98.7 F (37.1 C)  Recent Labs  Lab 08/10/19 0800 08/10/19 1550 08/11/19 0212  WBC 10.3 7.8 7.2  CREATININE 0.95 0.95 1.00    Estimated Creatinine Clearance: 65.8 mL/min (by C-G formula based on SCr of 1 mg/dL).    Allergies  Allergen Reactions  . Erythromycin Anaphylaxis    Chest pain  . Morphine And Related Nausea And Vomiting    Not given  . Promethazine Nausea And Vomiting    "made her sick"  . Simvastatin Other (See Comments)    Muscle aches   Unasyn 6/29 >>  Kamel Haven D. Mina Marble, PharmD, BCPS, [place] 08/11/2019, 4:05 PM

## 2019-08-12 ENCOUNTER — Other Ambulatory Visit: Payer: Self-pay

## 2019-08-12 ENCOUNTER — Encounter (HOSPITAL_COMMUNITY): Payer: Self-pay | Admitting: Internal Medicine

## 2019-08-12 MED ORDER — AMOXICILLIN-POT CLAVULANATE 875-125 MG PO TABS
1.0000 | ORAL_TABLET | Freq: Two times a day (BID) | ORAL | 0 refills | Status: DC
Start: 2019-08-12 — End: 2019-08-14

## 2019-08-12 MED ORDER — FLUCONAZOLE 100 MG PO TABS
100.0000 mg | ORAL_TABLET | Freq: Once | ORAL | Status: AC
  Administered 2019-08-12: 100 mg via ORAL
  Filled 2019-08-12: qty 1

## 2019-08-12 MED ORDER — POLYETHYLENE GLYCOL 3350 17 G PO PACK
17.0000 g | PACK | Freq: Two times a day (BID) | ORAL | Status: DC
  Administered 2019-08-12 – 2019-08-13 (×3): 17 g via ORAL
  Filled 2019-08-12 (×3): qty 1

## 2019-08-12 MED ORDER — POLYETHYLENE GLYCOL 3350 17 G PO PACK: 17.0000 g | PACK | Freq: Every day | ORAL | 0 refills | Status: DC | PRN

## 2019-08-12 MED ORDER — DOCUSATE SODIUM 100 MG PO CAPS
100.0000 mg | ORAL_CAPSULE | Freq: Two times a day (BID) | ORAL | Status: DC
  Administered 2019-08-12 – 2019-08-13 (×3): 100 mg via ORAL
  Filled 2019-08-12 (×3): qty 1

## 2019-08-12 MED ORDER — DOCUSATE SODIUM 100 MG PO CAPS
100.0000 mg | ORAL_CAPSULE | Freq: Every day | ORAL | 0 refills | Status: DC
Start: 2019-08-12 — End: 2019-08-31

## 2019-08-12 NOTE — Progress Notes (Signed)
[facility] Progress Note  Patient: Kathleen Sweeney    GXQ:[id number]  DOA: 08/10/2019     Date of Service: the patient was seen and examined on 08/12/2019  Brief hospital course: Presents with abdominal pain found to have diverticulitis.  Past medical history of recent diverticulitis in May, 2021, depression, fibromyalgia, HTN, HLD, hypothyroidism. Currently plan is continue IV antibiotics and gradually advance diet.  Assessment and Plan: 1.  Recurrent sigmoid diverticulitis Treated outpatient with oral Cipro and Flagyl without any improvement. CT of the abdomen is negative for any abscess or perforation. Labs are stable. We will treat with IV Unasyn instead of the Zosyn. Unable to tolerate soft diet this morning with worsening abdominal pain and nausea. Back: Full liquid diet.  Aggressive bowel regimen initiated.  Monitor.  2.  Hypothyroidism Continue Synthroid  3.  HLD Continue statin  4.  Depression, anxiety, insomnia. Continue home medication with Xanax  5.  GERD Continue PPI  Diet: Clear liquid diet DVT Prophylaxis: enoxaparin (LOVENOX) injection 40 mg Start: 08/10/19 1400 SCDs Start: 08/10/19 1346    Advance goals of care discussion: Full code  Family Communication: family was present at bedside, at the time of interview.   Disposition:  Status is: Inpatient  Remains inpatient appropriate because:IV treatments appropriate due to intensity of illness or inability to take PO   Dispo: The patient is from: Home              Anticipated d/c is to: Home              Anticipated d/c date is: 1 day              Patient currently is not medically stable to d/c.   Subjective: Recurrence of nausea no vomiting after soft diet breakfast this morning. no fever no chills.  Passing gas but no BM.  Physical Exam:  General: Appear in moderate distress, no Rash; Oral Mucosa Clear, moist. no Abnormal Neck Mass Or lumps, Conjunctiva normal  Cardiovascular: S1 and S2  Present, no Murmur, Respiratory: good respiratory effort, Bilateral Air entry present and Clear to Auscultation, nno Crackles, no wheezes Abdomen: Bowel Sound present, Soft and mild tenderness Extremities: no Pedal edema, no calf tenderness Neurology: alert and oriented to time, place, and person affect appropriate. no new focal deficit Gait not checked due to patient safety concerns  Vitals:   08/11/19 1600 08/11/19 2030 08/12/19 0434 08/12/19 1300  BP:  (!) 161/79 (!) 94/57 139/81  Pulse:  72 67 77  Resp:  14  17  Temp:  97.9 F (36.6 C) 97.9 F (36.6 C) 98 F (36.7 C)  TempSrc:  Oral Oral Oral  SpO2:  100% 94% 94%  Weight: 80.7 kg       Intake/Output Summary (Last 24 hours) at 08/12/2019 1934 Last data filed at 08/12/2019 1706 Gross per 24 hour  Intake 1120 ml  Output --  Net 1120 ml   Filed Weights   08/11/19 1600  Weight: 80.7 kg    Data Reviewed: I have personally reviewed and interpreted daily labs, tele strips, imagings as discussed above. I reviewed all nursing notes, pharmacy notes, vitals, pertinent old records I have discussed plan of care as described above with RN and patient/family.  CBC: Recent Labs  Lab 08/10/19 0800 08/10/19 1550 08/11/19 0212  WBC 10.3 7.8 7.2  HGB 13.8 13.6 12.4  HCT 43.8 43.1 39.4  MCV 86.6 87.4 86.6  PLT 259 254 144   Basic Metabolic  Panel: Recent Labs  Lab 08/10/19 0800 08/10/19 1550 08/11/19 0212  NA 137  --  140  K 4.6  --  3.7  CL 103  --  104  CO2 24  --  27  GLUCOSE 135*  --  98  BUN 9  --  6*  CREATININE 0.95 0.95 1.00  CALCIUM 9.3  --  8.9  MG  --  2.1  --   PHOS  --  3.9  --     Studies: No results found.  Scheduled Meds: . docusate sodium  100 mg Oral BID  . doxepin  25 mg Oral QHS  . enoxaparin (LOVENOX) injection  40 mg Subcutaneous Q24H  . levothyroxine  100 mcg Oral Q0600  . pantoprazole  40 mg Oral Daily  . polyethylene glycol  17 g Oral BID  . pravastatin  10 mg Oral Daily   Continuous  Infusions: . ampicillin-sulbactam (UNASYN) IV 3 g (08/12/19 1728)   PRN Meds: acetaminophen **OR** acetaminophen, butalbital-acetaminophen-caffeine, fentaNYL (SUBLIMAZE) injection, LORazepam, ondansetron (ZOFRAN) IV, tiZANidine  Time spent: 35 minutes  Author: Berle Mull, MD [facility] 08/12/2019 7:34 PM  To reach On-call, see care teams to locate the attending and reach out via [email or web address]. Between 7PM-7AM, please contact night-coverage If you still have difficulty reaching the attending provider, please page the [facility] (Director on Call) for [facility] on amion for assistance.

## 2019-08-12 NOTE — Discharge Instructions (Signed)
Diverticulitis  Diverticulitis is infection or inflammation of small pouches (diverticula) in the colon that form due to a condition called diverticulosis. Diverticula can trap stool (feces) and bacteria, causing infection and inflammation. Diverticulitis may cause severe stomach pain and diarrhea. It may lead to tissue damage in the colon that causes bleeding. The diverticula may also burst (rupture) and cause infected stool to enter other areas of the abdomen. Complications of diverticulitis can include:  Bleeding.  Severe infection.  Severe pain.  Rupture (perforation) of the colon.  Blockage (obstruction) of the colon. What are the causes? This condition is caused by stool becoming trapped in the diverticula, which allows bacteria to grow in the diverticula. This leads to inflammation and infection. What increases the risk? You are more likely to develop this condition if:  You have diverticulosis. The risk for diverticulosis increases if: ? You are overweight or obese. ? You use tobacco products. ? You do not get enough exercise.  You eat a diet that does not include enough fiber. High-fiber foods include fruits, vegetables, beans, nuts, and whole grains. What are the signs or symptoms? Symptoms of this condition may include:  Pain and tenderness in the abdomen. The pain is normally located on the left side of the abdomen, but it may occur in other areas.  Fever and chills.  Bloating.  Cramping.  Nausea.  Vomiting.  Changes in bowel routines.  Blood in your stool. How is this diagnosed? This condition is diagnosed based on:  Your medical history.  A physical exam.  Tests to make sure there is nothing else causing your condition. These tests may include: ? Blood tests. ? Urine tests. ? Imaging tests of the abdomen, including X-rays, ultrasounds, MRIs, or CT scans. How is this treated? Most cases of this condition are mild and can be treated at home.  Treatment may include:  Taking over-the-counter pain medicines.  Following a clear liquid diet.  Taking antibiotic medicines by mouth.  Rest. More severe cases may need to be treated at a hospital. Treatment may include:  Not eating or drinking.  Taking prescription pain medicine.  Receiving antibiotic medicines through an IV tube.  Receiving fluids and nutrition through an IV tube.  Surgery. When your condition is under control, your health care provider may recommend that you have a colonoscopy. This is an exam to look at the entire large intestine. During the exam, a lubricated, bendable tube is inserted into the anus and then passed into the rectum, colon, and other parts of the large intestine. A colonoscopy can show how severe your diverticula are and whether something else may be causing your symptoms. Follow these instructions at home: Medicines  Take over-the-counter and prescription medicines only as told by your health care provider. These include fiber supplements, probiotics, and stool softeners.  If you were prescribed an antibiotic medicine, take it as told by your health care provider. Do not stop taking the antibiotic even if you start to feel better.  Do not drive or use heavy machinery while taking prescription pain medicine. General instructions   Follow a full liquid diet or another diet as directed by your health care provider. After your symptoms improve, your health care provider may tell you to change your diet. He or she may recommend that you eat a diet that contains at least 25 g (25 grams) of fiber daily. Fiber makes it easier to pass stool. Healthy sources of fiber include: ? Berries. One cup contains 4-8 grams of   fiber. ? Beans or lentils. One half cup contains 5-8 grams of fiber. ? Green vegetables. One cup contains 4 grams of fiber.  Exercise for at least 30 minutes, 3 times each week. You should exercise hard enough to raise your heart rate and  break a sweat.  Keep all follow-up visits as told by your health care provider. This is important. You may need a colonoscopy. Contact a health care provider if:  Your pain does not improve.  You have a hard time drinking or eating food.  Your bowel movements do not return to normal. Get help right away if:  Your pain gets worse.  Your symptoms do not get better with treatment.  Your symptoms suddenly get worse.  You have a fever.  You vomit more than one time.  You have stools that are bloody, black, or tarry. Summary  Diverticulitis is infection or inflammation of small pouches (diverticula) in the colon that form due to a condition called diverticulosis. Diverticula can trap stool (feces) and bacteria, causing infection and inflammation.  You are at higher risk for this condition if you have diverticulosis and you eat a diet that does not include enough fiber.  Most cases of this condition are mild and can be treated at home. More severe cases may need to be treated at a hospital.  When your condition is under control, your health care provider may recommend that you have an exam called a colonoscopy. This exam can show how severe your diverticula are and whether something else may be causing your symptoms. This information is not intended to replace advice given to you by your health care provider. Make sure you discuss any questions you have with your health care provider. Document Revised: 01/11/2017 Document Reviewed: 03/03/2016 Elsevier Patient Education  2020 [employer] Inc[employer]  

## 2019-08-13 LAB — MAGNESIUM: Magnesium: 2 mg/dL (ref 1.7–2.4)

## 2019-08-13 LAB — CBC
HCT: 40.3 % (ref 36.0–46.0)
Hemoglobin: 13 g/dL (ref 12.0–15.0)
MCH: 27.8 pg (ref 26.0–34.0)
MCHC: 32.3 g/dL (ref 30.0–36.0)
MCV: 86.3 fL (ref 80.0–100.0)
Platelets: 286 10*3/uL (ref 150–400)
RBC: 4.67 MIL/uL (ref 3.87–5.11)
RDW: 12.7 % (ref 11.5–15.5)
WBC: 6 10*3/uL (ref 4.0–10.5)
nRBC: 0 % (ref 0.0–0.2)

## 2019-08-13 LAB — BASIC METABOLIC PANEL
Anion gap: 6 (ref 5–15)
BUN: <5 mg/dL — ABNORMAL LOW (ref 8–23)
CO2: 30 mmol/L (ref 22–32)
Calcium: 9.1 mg/dL (ref 8.9–10.3)
Chloride: 106 mmol/L (ref 98–111)
Creatinine, Ser: 0.92 mg/dL (ref 0.44–1.00)
GFR calc Af Amer: >60 mL/min (ref >60)
GFR calc non Af Amer: >60 mL/min (ref >60)
Glucose, Bld: 97 mg/dL (ref 70–99)
Potassium: 4.2 mmol/L (ref 3.5–5.1)
Sodium: 142 mmol/L (ref 135–145)

## 2019-08-13 MED ORDER — LACTULOSE 10 GM/15ML PO SOLN
20.0000 g | Freq: Three times a day (TID) | ORAL | Status: DC
  Administered 2019-08-13: 20 g via ORAL
  Filled 2019-08-13: qty 30

## 2019-08-13 MED ORDER — SIMETHICONE 80 MG PO CHEW
80.0000 mg | CHEWABLE_TABLET | Freq: Four times a day (QID) | ORAL | Status: DC
  Administered 2019-08-13 – 2019-08-14 (×3): 80 mg via ORAL
  Filled 2019-08-13 (×3): qty 1

## 2019-08-13 MED ORDER — ZOLPIDEM TARTRATE 5 MG PO TABS
5.0000 mg | ORAL_TABLET | Freq: Every evening | ORAL | Status: DC | PRN
  Administered 2019-08-13: 5 mg via ORAL
  Filled 2019-08-13: qty 1

## 2019-08-13 MED ORDER — KETOROLAC TROMETHAMINE 15 MG/ML IJ SOLN
15.0000 mg | Freq: Three times a day (TID) | INTRAMUSCULAR | Status: DC
  Administered 2019-08-13: 15 mg via INTRAVENOUS
  Filled 2019-08-13: qty 1

## 2019-08-13 MED ORDER — TRAMADOL HCL 50 MG PO TABS: 50.0000 mg | ORAL_TABLET | Freq: Four times a day (QID) | ORAL | Status: DC | PRN

## 2019-08-13 MED ORDER — SACCHAROMYCES BOULARDII 250 MG PO CAPS
250.0000 mg | ORAL_CAPSULE | Freq: Two times a day (BID) | ORAL | Status: DC
  Administered 2019-08-13 – 2019-08-14 (×2): 250 mg via ORAL
  Filled 2019-08-13 (×2): qty 1

## 2019-08-13 MED ORDER — AMOXICILLIN-POT CLAVULANATE 875-125 MG PO TABS
1.0000 | ORAL_TABLET | Freq: Two times a day (BID) | ORAL | Status: DC
  Administered 2019-08-13 – 2019-08-14 (×2): 1 via ORAL
  Filled 2019-08-13 (×2): qty 1

## 2019-08-13 NOTE — Progress Notes (Signed)
Ok to leave out PIV per Dr. Posey Pronto. MD to switch antibiotics to PO.

## 2019-08-13 NOTE — Progress Notes (Signed)
[facility] consulted to obtain IV access. Upon entering pt's room she complained that she did not want another IV. " My antibiotics should be finished. I am not dehydrated. The only reason I didn't go home today was because I didn't have a bowel movement".  [facility] RN agreed to speak with nurse and physician about leaving out IV access. Spoke with pt's nurse, Clarene Critchley; she contacted physician who called and spoke with patient.  IV access not needed at this time. Physician informed patient if she became dehydrated she might need an IV at later time; pt was agreeable with this plan.

## 2019-08-13 NOTE — Progress Notes (Signed)
[facility] Progress Note  Patient: Kathleen Sweeney    WLS:[id number]  DOA: 08/10/2019     Date of Service: the patient was seen and examined on 08/13/2019  Brief hospital course: Presents with abdominal pain found to have diverticulitis.  Past medical history of recent diverticulitis in May, 2021, depression, fibromyalgia, HTN, HLD, hypothyroidism. Currently plan is continue antibiotics and gradually advance diet.  Assessment and Plan: 1.  Recurrent sigmoid diverticulitis Constipation now diarrhea Treated outpatient with oral Cipro and Flagyl without any improvement. CT of the abdomen is negative for any abscess or perforation. Labs are stable. Treated with IV Unasyn instead of the Zosyn. Tolerating soft diet. Now has 5 bowel movements.  Will monitor overnight  2.  Hypothyroidism Continue Synthroid  3.  HLD Continue statin  4.  Depression, anxiety, insomnia. Continue home medication with Xanax  5.  GERD Continue PPI  Diet: Soft diet DVT Prophylaxis: enoxaparin (LOVENOX) injection 40 mg Start: 08/10/19 1400 SCDs Start: 08/10/19 1346    Advance goals of care discussion: Full code  Family Communication: family was present at bedside, at the time of interview.   Disposition:  Status is: Inpatient  Remains inpatient appropriate because:IV treatments appropriate due to intensity of illness or inability to take PO   Dispo: The patient is from: Home              Anticipated d/c is to: Home              Anticipated d/c date is: 1 day              Patient currently is not medically stable to d/c.   Subjective: Reported nausea in the morning.  No abdominal pain otherwise but pain when she is passing gas.  No fever no chills. Later in the afternoon had a 5 bowel movements.  Physical Exam:  General: Appear in mild distress, no Rash; Oral Mucosa Clear, moist. no Abnormal Neck Mass Or lumps, Conjunctiva normal  Cardiovascular: S1 and S2 Present, no  Murmur, Respiratory: good respiratory effort, Bilateral Air entry present and Clear to Auscultation, nno Crackles, no wheezes Abdomen: Bowel Sound present, Soft and mild tenderness Extremities: no Pedal edema, no calf tenderness Neurology: alert and oriented to time, place, and person affect appropriate. no new focal deficit Gait not checked due to patient safety concerns  Vitals:   08/12/19 1300 08/12/19 2109 08/13/19 0416 08/13/19 1309  BP: 139/81 (!) 151/91 129/87 (!) 163/80  Pulse: 77 68 66 66  Resp: 17 15 15 16   Temp: 98 F (36.7 C) 97.6 F (36.4 C) 97.8 F (36.6 C) 98.1 F (36.7 C)  TempSrc: Oral Oral Oral Oral  SpO2: 94% 100% 96% 99%  Weight:        Intake/Output Summary (Last 24 hours) at 08/13/2019 1853 Last data filed at 08/13/2019 1800 Gross per 24 hour  Intake 1876.38 ml  Output --  Net 1876.38 ml   Filed Weights   08/11/19 1600  Weight: 80.7 kg    Data Reviewed: I have personally reviewed and interpreted daily labs, tele strips, imagings as discussed above. I reviewed all nursing notes, pharmacy notes, vitals, pertinent old records I have discussed plan of care as described above with RN and patient/family.  CBC: Recent Labs  Lab 08/10/19 0800 08/10/19 1550 08/11/19 0212 08/13/19 0152  WBC 10.3 7.8 7.2 6.0  HGB 13.8 13.6 12.4 13.0  HCT 43.8 43.1 39.4 40.3  MCV 86.6 87.4 86.6 86.3  PLT 259 254  242 470   Basic Metabolic Panel: Recent Labs  Lab 08/10/19 0800 08/10/19 1550 08/11/19 0212 08/13/19 0152  NA 137  --  140 142  K 4.6  --  3.7 4.2  CL 103  --  104 106  CO2 24  --  27 30  GLUCOSE 135*  --  98 97  BUN 9  --  6* <5*  CREATININE 0.95 0.95 1.00 0.92  CALCIUM 9.3  --  8.9 9.1  MG  --  2.1  --  2.0  PHOS  --  3.9  --   --     Studies: No results found.  Scheduled Meds:  amoxicillin-clavulanate  1 tablet Oral Q12H   doxepin  25 mg Oral QHS   enoxaparin (LOVENOX) injection  40 mg Subcutaneous Q24H   levothyroxine  100 mcg Oral  Q0600   pantoprazole  40 mg Oral Daily   pravastatin  10 mg Oral Daily   saccharomyces boulardii  250 mg Oral BID   simethicone  80 mg Oral QID   Continuous Infusions:  PRN Meds: acetaminophen **OR** acetaminophen, butalbital-acetaminophen-caffeine, LORazepam, ondansetron (ZOFRAN) IV, tiZANidine, traMADol, zolpidem  Time spent: 35 minutes  Author: Berle Mull, MD [facility] 08/13/2019 6:53 PM  To reach On-call, see care teams to locate the attending and reach out via [email or web address]. Between 7PM-7AM, please contact night-coverage If you still have difficulty reaching the attending provider, please page the [facility] (Director on Call) for [facility] on amion for assistance.

## 2019-08-14 LAB — BASIC METABOLIC PANEL
Anion gap: 10 (ref 5–15)
BUN: 5 mg/dL — ABNORMAL LOW (ref 8–23)
CO2: 25 mmol/L (ref 22–32)
Calcium: 8.9 mg/dL (ref 8.9–10.3)
Chloride: 104 mmol/L (ref 98–111)
Creatinine, Ser: 0.88 mg/dL (ref 0.44–1.00)
GFR calc Af Amer: >60 mL/min (ref >60)
GFR calc non Af Amer: >60 mL/min (ref >60)
Glucose, Bld: 105 mg/dL — ABNORMAL HIGH (ref 70–99)
Potassium: 3.7 mmol/L (ref 3.5–5.1)
Sodium: 139 mmol/L (ref 135–145)

## 2019-08-14 LAB — CBC
HCT: 40.3 % (ref 36.0–46.0)
Hemoglobin: 12.9 g/dL (ref 12.0–15.0)
MCH: 27.3 pg (ref 26.0–34.0)
MCHC: 32 g/dL (ref 30.0–36.0)
MCV: 85.4 fL (ref 80.0–100.0)
Platelets: 304 10*3/uL (ref 150–400)
RBC: 4.72 MIL/uL (ref 3.87–5.11)
RDW: 12.8 % (ref 11.5–15.5)
WBC: 9 10*3/uL (ref 4.0–10.5)
nRBC: 0 % (ref 0.0–0.2)

## 2019-08-14 MED ORDER — GABAPENTIN 100 MG PO CAPS
100.0000 mg | ORAL_CAPSULE | Freq: Two times a day (BID) | ORAL | 0 refills | Status: DC
Start: 2019-08-14 — End: 2019-08-31

## 2019-08-14 MED ORDER — AMOXICILLIN-POT CLAVULANATE 875-125 MG PO TABS
1.0000 | ORAL_TABLET | Freq: Two times a day (BID) | ORAL | 0 refills | Status: AC
Start: 2019-08-14 — End: 2019-08-16

## 2019-08-14 NOTE — Plan of Care (Signed)

## 2019-08-17 NOTE — Discharge Summary (Signed)
[facility] Discharge Summary   Patient: Kathleen Sweeney HTD:[id number]  PCP: Maurice Small, MD  Date of admission: 08/10/2019   Date of discharge: 08/14/2019     Discharge Diagnoses:  Principal Problem:   Diverticulitis Active Problems:   Hypothyroid   Depression   Hypertension   Hyperlipidemia   Insomnia   Acute diverticulitis   Admitted From: home Disposition:  Home   Recommendations for Outpatient Follow-up:  1. PCP: please follow up with PCP in 1 week 2. Follow up with Gastroenterology due to recurrent diverticulitis for coloscopy 3. Follow up LABS/TEST:  none   Follow-up Information    Maurice Small, MD. Schedule an appointment as soon as possible for a visit in 1 week(s).   Specialty: Family Medicine Contact information: [address] [place] [address] [phone]        Wilford Corner, MD. Schedule an appointment as soon as possible for a visit in 1 month(s).   Specialty: Gastroenterology Contact information: [address] [place] [address] [phone]              Diet recommendation: Cardiac diet  Activity: The patient is advised to gradually reintroduce usual activities, as tolerated  Discharge Condition: stable  Code Status: Full code   History of present illness: As per the H and P dictated on admission, "Kathleen Sweeney is a 59 y.o. female with medical history significant of depression/anxiety, GERD, hyperlipidemia, hypothyroidism presents to emergency department due to worsening abdominal pain associated with vomiting.  Patient tells me that she came to ER on  5/10 with left lower quadrant pain.  CT abdomen shows acute sigmoid diverticulitis without acute complication.  Patient discharged home on Cipro and Flagyl.  Patient tells me that she finished Cipro as prescribed however she could not take Flagyl due to bad taste.  She tells me that she could not tolerate Flagyl.  Her symptoms improved  however since Thursday she started having left lower quadrant pain, severe in intensity, 9 out of 10, nonradiating, no aggravating or relieving factors, associated with nonbloody vomiting.  She tells me that she vomited twice since last night. Reports decreased appetite however denies diarrhea, melena, fever, chills, chest pain, shortness of breath, palpitation, leg swelling, weakness or lethargy.    No history of smoking, alcohol, illicit drug use.  She is not allergic to penicillin.  Of note: Patient recently evaluated by neurology outpatient for ptosis and weakness-concern for myasthenia gravis.  She had nerve conduction study on 6/17 which came back negative."  Hospital Course:  Summary of her active problems in the hospital is as following. 1.  Recurrent sigmoid diverticulitis Constipation now diarrhea Treated outpatient with oral Cipro and Flagyl without any improvement. CT of the abdomen is negative for any abscess or perforation. Labs are stable. Treated with IV Unasyn instead of the Zosyn. Tolerating soft diet. Continue oral Antibiotics  Pt was informed about the importance of Gastroenterology follow up. Also d/w Gastroenterology, per them best option is to provide the pt office's contact number to arrange outpt follow up.   2.  Hypothyroidism Continue Synthroid  3.  HLD Continue statin  4.  Depression, anxiety, insomnia. Continue home medication with Xanax  5.  GERD Continue PPI  Patient was ambulatory without any assistance. On the day of the discharge the patient's vitals were stable, and no other acute medical condition were reported by patient. the patient was felt safe to be discharge at Home with no  therapy needed on discharge.  Consultants: none Procedures: noen  Discharge Exam: General: Appear in no distress, no Rash; Oral Mucosa Clear, moist. Cardiovascular: S1 and S2 Present, no Murmur, Respiratory: normal respiratory effort, Bilateral Air entry  present and no Crackles, no wheezes Abdomen: Bowel Sound present, Soft and mild tenderness, no hernia Extremities: no Pedal edema, no calf tenderness Neurology: alert and oriented to time, place, and person affect appropriate.  Filed Weights   08/11/19 1600 08/13/19 1940  Weight: 80.7 kg 80.9 kg   Vitals:   08/13/19 2153 08/14/19 0526  BP:  (!) 148/82  Pulse:  82  Resp:  17  Temp: 98.4 F (36.9 C) 98.1 F (36.7 C)  SpO2:  94%    DISCHARGE MEDICATION: Allergies as of 08/14/2019      Reactions   Erythromycin Anaphylaxis   Chest pain   Morphine And Related Nausea And Vomiting   Not given   Promethazine Nausea And Vomiting   "made her sick"   Simvastatin Other (See Comments)   Muscle aches      Medication List    TAKE these medications   acetaminophen 325 MG tablet Commonly known as: TYLENOL Take 325 mg by mouth 2 (two) times daily as needed for mild pain.   docusate sodium 100 MG capsule Commonly known as: Colace Take 1 capsule (100 mg total) by mouth daily.   doxepin 25 MG capsule Commonly known as: SINEQUAN Take 25-50 mg by mouth at bedtime.   gabapentin 100 MG capsule Commonly known as: Neurontin Take 1 capsule (100 mg total) by mouth 2 (two) times daily for 14 days.   levothyroxine 100 MCG tablet Commonly known as: SYNTHROID Take 100 mcg by mouth daily.   LORazepam 1 MG tablet Commonly known as: ATIVAN Take 1 mg by mouth 3 (three) times daily as needed for anxiety or sleep.   multivitamin capsule Take 1 capsule by mouth daily.   omeprazole 20 MG capsule Commonly known as: PRILOSEC Take 20 mg by mouth daily as needed.   polyethylene glycol 17 g packet Commonly known as: MiraLax Take 17 g by mouth daily as needed for mild constipation.   pravastatin 10 MG tablet Commonly known as: PRAVACHOL Take 10 mg by mouth daily.   Probiotic Caps Take 1 capsule by mouth daily.   tiZANidine 4 MG tablet Commonly known as: ZANAFLEX Take 4 mg by mouth at  bedtime as needed for muscle spasms.   zolpidem 12.5 MG CR tablet Commonly known as: AMBIEN CR Take 12.5 mg by mouth at bedtime as needed for sleep.     ASK your doctor about these medications   amoxicillin-clavulanate 875-125 MG tablet Commonly known as: Augmentin Take 1 tablet by mouth 2 (two) times daily for 2 days. Ask about: Should I take this medication?      Allergies  Allergen Reactions  . Erythromycin Anaphylaxis    Chest pain  . Morphine And Related Nausea And Vomiting    Not given  . Promethazine Nausea And Vomiting    "made her sick"  . Simvastatin Other (See Comments)    Muscle aches   Discharge Instructions    DIET SOFT   Complete by: As directed    Increase activity slowly   Complete by: As directed       The results of significant diagnostics from this hospitalization (including imaging, microbiology, ancillary and laboratory) are listed below for reference.    Significant Diagnostic Studies: CT ABDOMEN PELVIS W CONTRAST  Result Date:  08/10/2019 CLINICAL DATA:  Abdominal pain and vomiting. EXAM: CT ABDOMEN AND PELVIS WITH CONTRAST TECHNIQUE: Multidetector CT imaging of the abdomen and pelvis was performed using the standard protocol following bolus administration of intravenous contrast. CONTRAST:  157mL OMNIPAQUE IOHEXOL 300 MG/ML  SOLN COMPARISON:  CT scan 06/22/2019 FINDINGS: Lower chest: The lung bases are clear of acute process. No pleural effusion or pulmonary lesions. The heart is normal in size. No pericardial effusion. The distal esophagus and aorta are unremarkable. Hepatobiliary: No focal hepatic lesions or intrahepatic biliary dilatation. The gallbladder appears normal. No common bile duct dilatation. The portal and hepatic veins are patent. Pancreas: No mass, inflammation or ductal dilatation. Small duodenal diverticulum noted near the pancreatic head. Spleen: Small calcified granulomas but no mass. Normal size. Adrenals/Urinary Tract: The adrenal  glands and kidneys are unremarkable. The bladder is unremarkable. Stomach/Bowel: The stomach, duodenum and small bowel are unremarkable. No acute inflammatory changes, mass lesions or obstructive findings. The terminal ileum is normal. The appendix is surgically absent. Changes of acute uncomplicated diverticulitis involving the mid sigmoid colon. Severe sigmoid colon diverticulosis. No abscess or free air. Stable significant diverticulosis involving the descending colon also. Vascular/Lymphatic: Age advanced atherosclerotic calcifications involving the aorta and iliac arteries but no aneurysm or dissection. The branch vessels are patent. The major venous structures are patent. No mesenteric or retroperitoneal mass or adenopathy. Reproductive: Surgically absent. Other: No pelvic mass or adenopathy. No free pelvic fluid collections. No inguinal mass or adenopathy. No abdominal wall hernia or subcutaneous lesions. Musculoskeletal: No significant bony findings. IMPRESSION: 1. Acute uncomplicated diverticulitis involving the mid sigmoid colon. 2. No other significant abdominal/pelvic findings, mass lesions or adenopathy. 3. Age advanced atherosclerotic calcifications involving the aorta and iliac arteries. Aortic Atherosclerosis (ICD10-I70.0). Electronically Signed   By: Marijo Sanes M.D.   On: 08/10/2019 13:01   NCV with EMG(electromyography)  Result Date: 07/30/2019 [name], MD     08/06/2019  3:44 PM  [facility] NCS (NERVE CONDUCTION STUDY) WITH EMG (ELECTROMYOGRAPHY) REPORT STUDY DATE: 07/30/19 PATIENT NAME: MAILEE KLAAS DOB: [birth year] MRN: [id number] ORDERING CLINICIAN: Andrey Spearman, MD TECHNOLOGIST: Sherre Scarlet ELECTROMYOGRAPHER: Earlean Polka. Penumalli, MD CLINICAL INFORMATION: 59 year old female with ptosis and weakness.  Evaluate for myasthenia gravis. FINDINGS: NERVE CONDUCTION STUDY: Right median, right ulnar, right peroneal and right tibial motor responses normal. Right sural,  right superficial peroneal, right median and right ulnar sensory responses are normal. Right tibial and right ulnar F-wave latencies are normal. Repetitive nerve stimulation of right spinal accessory nerve and recording of right trapezius muscle was performed at baseline, immediately after exertion and at 1 minute increments up to 5 minutes after exertion.  No significant decremental response was noted at baseline or following exertion. NEEDLE ELECTROMYOGRAPHY: Needle examination of right upper extremity is normal. IMPRESSION: This is a normal study.  No electrodiagnostic evidence of large fiber neuropathy, myopathy or neuromuscular junction disorder at this time. INTERPRETING PHYSICIAN: [name], MD Certified in Neurology, Neurophysiology and Neuroimaging [facility] [address], [place], [place] [address] [phone] [facility]   Nerve / Sites Muscle Latency Ref. Amplitude Ref. Rel Amp Segments Distance Velocity Ref. Area   ms ms mV mV %  cm m/s m/s mVms R Median - APB    Wrist APB 3.0 ?4.4 8.0 ?4.0 100 Wrist - APB 7   26.8    Upper arm APB 7.1  7.5  93.4 Upper arm - Wrist 23 55 ?49 24.5 R Ulnar - ADM  Wrist ADM 2.7 ?3.3 9.2 ?6.0 100 Wrist - ADM 7   24.4    B.Elbow ADM 6.0  8.5  92.2 B.Elbow - Wrist 21 63 ?49 24.1    A.Elbow ADM 7.9  8.5  101 A.Elbow - B.Elbow 10 53 ?49 24.2        A.Elbow - Wrist     R Peroneal - EDB    Ankle EDB 3.5 ?6.5 4.8 ?2.0 100 Ankle - EDB 9   19.8    Fib head EDB 10.1  4.1  85.8 Fib head - Ankle 32 48 ?44 20.6    Pop fossa EDB 12.2  4.4  106 Pop fossa - Fib head 10 49 ?44 21.3        Pop fossa - Ankle     R Tibial - AH    Ankle AH 3.6 ?5.8 8.7 ?4.0 100 Ankle - AH 9   13.3    Pop fossa AH 13.0  4.9  56.4 Pop fossa - Ankle 41 44 ?41 13.4           SNC   Nerve / Sites Rec. Site Peak Lat Ref.  Amp Ref. Segments Distance   ms ms V V  cm R Sural - Ankle (Calf)    Calf Ankle 3.7 ?4.4 15 ?6 Calf - Ankle 14 R Superficial peroneal - Ankle    Lat leg Ankle 2.5  ?4.4 31 ?6 Lat leg - Ankle 14 R Median - Orthodromic (Dig II, Mid palm)    Dig II Wrist 2.7 ?3.4 20 ?10 Dig II - Wrist 13 R Ulnar - Orthodromic, (Dig V, Mid palm)    Dig V Wrist 2.5 ?3.1 11 ?5 Dig V - Wrist 67           F  Wave   Nerve F Lat Ref.  ms ms R Tibial - AH 52.7 ?56.0 R Ulnar - ADM 28.4 ?32.0       Rep Stim   Anatomy / Train Rate Ampl. Ampl 4-1 Fac Ampl Area Area 4-1 Fac Area Time  Hz mV % % mVms % %  R Trapezius (upper) - (Accessory spinal) Baseline @1Hz  1 5.3 0.3 100 51.7 2.6 100 0:00:00 Baseline @3Hz  3 5.5 4.7 103 52.5 20 101 0:00:26 Post Exercise @0 :00 3 4.8 -5 90.1 43.6 6.1 84.2 0:01:52 @ 0:30 3 5.7 5.1 106 52.8 17.1 102 0:02:42 @ 1:00 3 5.8 11 109 54.4 18.3 105 0:03:13 @ 2:00 3 5.7 4.2 107 54.5 20.5 105 0:04:14 @ 3:00 3 5.8 5.7 109 54.6 20 106 0:05:15 @ 4:00 3 5.8 9.1 109 54.1 16.9 105 0:06:16 @ 5:00 3 5.8 6.3 108 54.4 19.8 105 0:07:17     EMG Summary Table   Spontaneous MUAP Recruitment Muscle IA Fib PSW Fasc Other Amp Dur. Poly Pattern R. Deltoid Normal None None None _______ Normal Normal Normal Normal R. Biceps brachii Normal None None None _______ Normal Normal Normal Normal R. Triceps brachii Normal None None None _______ Normal Normal Normal Normal R. Flexor carpi radialis Normal None None None _______ Normal Normal Normal Normal R. First dorsal interosseous Normal None None None _______ Normal Normal Normal Normal     Microbiology: Recent Results (from the past 240 hour(s))  SARS Coronavirus 2 by RT PCR (hospital order, performed in [facility] hospital lab) Nasopharyngeal Nasopharyngeal Swab     Status: None   Collection Time: 08/10/19  3:30 PM   Specimen: Nasopharyngeal Swab  Result Value Ref Range Status  SARS Coronavirus 2 NEGATIVE NEGATIVE Final    Comment: (NOTE) SARS-CoV-2 target nucleic acids are NOT DETECTED.  The SARS-CoV-2 RNA is generally detectable in upper and lower respiratory specimens during the acute phase of infection. The lowest concentration of SARS-CoV-2  viral copies this assay can detect is 250 copies / mL. A negative result does not preclude SARS-CoV-2 infection and should not be used as the sole basis for treatment or other patient management decisions.  A negative result may occur with improper specimen collection / handling, submission of specimen other than nasopharyngeal swab, presence of viral mutation(s) within the areas targeted by this assay, and inadequate number of viral copies (<250 copies / mL). A negative result must be combined with clinical observations, patient history, and epidemiological information.  Fact Sheet for Patients:   StrictlyIdeas.no  Fact Sheet for Healthcare Providers: BankingDealers.co.za  This test is not yet approved or  cleared by the [place] FDA and has been authorized for detection and/or diagnosis of SARS-CoV-2 by FDA under an Emergency Use Authorization (EUA).  This EUA will remain in effect (meaning this test can be used) for the duration of the COVID-19 declaration under Section 564(b)(1) of the Act, 21 U.S.C. section 360bbb-3(b)(1), unless the authorization is terminated or revoked sooner.  Performed at [facility], [place] [address], [place], [place] [address]      Labs: CBC: Recent Labs  Lab 08/10/19 0800 08/10/19 1550 08/11/19 0212 08/13/19 0152 08/14/19 0212  WBC 10.3 7.8 7.2 6.0 9.0  HGB 13.8 13.6 12.4 13.0 12.9  HCT 43.8 43.1 39.4 40.3 40.3  MCV 86.6 87.4 86.6 86.3 85.4  PLT 259 254 242 286 621   Basic Metabolic Panel: Recent Labs  Lab 08/10/19 0800 08/10/19 1550 08/11/19 0212 08/13/19 0152 08/14/19 0212  NA 137  --  140 142 139  K 4.6  --  3.7 4.2 3.7  CL 103  --  104 106 104  CO2 24  --  27 30 25   GLUCOSE 135*  --  98 97 105*  BUN 9  --  6* <5* 5*  CREATININE 0.95 0.95 1.00 0.92 0.88  CALCIUM 9.3  --  8.9 9.1 8.9  MG  --  2.1  --  2.0  --   PHOS  --  3.9  --   --   --    Liver Function  Tests: Recent Labs  Lab 08/10/19 0800 08/11/19 0212  AST 24 22  ALT 16 16  ALKPHOS 86 79  BILITOT 0.6 1.3*  PROT 7.2 6.4*  ALBUMIN 3.6 3.0*   Recent Labs  Lab 08/10/19 0800  LIPASE 21   No results for input(s): AMMONIA in the last 168 hours. Cardiac Enzymes: No results for input(s): CKTOTAL, CKMB, CKMBINDEX, TROPONINI in the last 168 hours. BNP (last 3 results) No results for input(s): BNP in the last 8760 hours. CBG: No results for input(s): GLUCAP in the last 168 hours.  Time spent: 35 minutes  Signed:  Berle Mull  [facility] 08/14/2019 7:56 AM

## 2019-08-21 DIAGNOSIS — Z5181 Encounter for therapeutic drug level monitoring: Secondary | ICD-10-CM | POA: Diagnosis not present

## 2019-08-21 DIAGNOSIS — K5792 Diverticulitis of intestine, part unspecified, without perforation or abscess without bleeding: Secondary | ICD-10-CM | POA: Diagnosis not present

## 2019-08-25 DIAGNOSIS — R404 Transient alteration of awareness: Secondary | ICD-10-CM | POA: Diagnosis not present

## 2019-08-25 DIAGNOSIS — R29810 Facial weakness: Secondary | ICD-10-CM | POA: Diagnosis not present

## 2019-08-25 DIAGNOSIS — R410 Disorientation, unspecified: Secondary | ICD-10-CM | POA: Diagnosis not present

## 2019-08-25 DIAGNOSIS — R531 Weakness: Secondary | ICD-10-CM | POA: Diagnosis not present

## 2019-08-26 ENCOUNTER — Observation Stay (HOSPITAL_COMMUNITY)
Admission: EM | Admit: 2019-08-26 | Discharge: 2019-08-27 | Disposition: A | Discharge status: Home / Self Care | Payer: BC Managed Care – PPO | Attending: Internal Medicine | Admitting: Internal Medicine

## 2019-08-26 ENCOUNTER — Observation Stay (HOSPITAL_BASED_OUTPATIENT_CLINIC_OR_DEPARTMENT_OTHER): Payer: BC Managed Care – PPO

## 2019-08-26 ENCOUNTER — Emergency Department (HOSPITAL_COMMUNITY): Payer: BC Managed Care – PPO

## 2019-08-26 ENCOUNTER — Encounter (HOSPITAL_COMMUNITY): Payer: Self-pay

## 2019-08-26 ENCOUNTER — Other Ambulatory Visit: Payer: Self-pay

## 2019-08-26 DIAGNOSIS — G479 Sleep disorder, unspecified: Secondary | ICD-10-CM | POA: Diagnosis not present

## 2019-08-26 DIAGNOSIS — J90 Pleural effusion, not elsewhere classified: Secondary | ICD-10-CM | POA: Diagnosis not present

## 2019-08-26 DIAGNOSIS — Y92094 Garage of other non-institutional residence as the place of occurrence of the external cause: Secondary | ICD-10-CM | POA: Insufficient documentation

## 2019-08-26 DIAGNOSIS — Z79899 Other long term (current) drug therapy: Secondary | ICD-10-CM | POA: Diagnosis not present

## 2019-08-26 DIAGNOSIS — I129 Hypertensive chronic kidney disease with stage 1 through stage 4 chronic kidney disease, or unspecified chronic kidney disease: Secondary | ICD-10-CM | POA: Diagnosis not present

## 2019-08-26 DIAGNOSIS — T5891XA Toxic effect of carbon monoxide from unspecified source, accidental (unintentional), initial encounter: Secondary | ICD-10-CM

## 2019-08-26 DIAGNOSIS — T1491XA Suicide attempt, initial encounter: Secondary | ICD-10-CM

## 2019-08-26 DIAGNOSIS — R21 Rash and other nonspecific skin eruption: Secondary | ICD-10-CM | POA: Diagnosis not present

## 2019-08-26 DIAGNOSIS — Z7729 Contact with and (suspected ) exposure to other hazardous substances: Secondary | ICD-10-CM

## 2019-08-26 DIAGNOSIS — N189 Chronic kidney disease, unspecified: Secondary | ICD-10-CM | POA: Diagnosis not present

## 2019-08-26 DIAGNOSIS — T588X2A Toxic effect of carbon monoxide from other source, intentional self-harm, initial encounter: Principal | ICD-10-CM | POA: Insufficient documentation

## 2019-08-26 DIAGNOSIS — Z87891 Personal history of nicotine dependence: Secondary | ICD-10-CM | POA: Diagnosis not present

## 2019-08-26 DIAGNOSIS — R29818 Other symptoms and signs involving the nervous system: Secondary | ICD-10-CM | POA: Diagnosis not present

## 2019-08-26 DIAGNOSIS — F419 Anxiety disorder, unspecified: Secondary | ICD-10-CM | POA: Diagnosis not present

## 2019-08-26 DIAGNOSIS — G459 Transient cerebral ischemic attack, unspecified: Secondary | ICD-10-CM

## 2019-08-26 DIAGNOSIS — Y939 Activity, unspecified: Secondary | ICD-10-CM | POA: Diagnosis not present

## 2019-08-26 DIAGNOSIS — E785 Hyperlipidemia, unspecified: Secondary | ICD-10-CM | POA: Insufficient documentation

## 2019-08-26 DIAGNOSIS — I6521 Occlusion and stenosis of right carotid artery: Secondary | ICD-10-CM | POA: Diagnosis not present

## 2019-08-26 DIAGNOSIS — R531 Weakness: Secondary | ICD-10-CM | POA: Diagnosis not present

## 2019-08-26 DIAGNOSIS — X838XXA Intentional self-harm by other specified means, initial encounter: Secondary | ICD-10-CM | POA: Insufficient documentation

## 2019-08-26 DIAGNOSIS — G47429 Narcolepsy in conditions classified elsewhere without cataplexy: Secondary | ICD-10-CM

## 2019-08-26 DIAGNOSIS — G4700 Insomnia, unspecified: Secondary | ICD-10-CM | POA: Diagnosis not present

## 2019-08-26 DIAGNOSIS — Z20822 Contact with and (suspected) exposure to covid-19: Secondary | ICD-10-CM | POA: Insufficient documentation

## 2019-08-26 DIAGNOSIS — I639 Cerebral infarction, unspecified: Secondary | ICD-10-CM | POA: Diagnosis not present

## 2019-08-26 DIAGNOSIS — E039 Hypothyroidism, unspecified: Secondary | ICD-10-CM | POA: Diagnosis not present

## 2019-08-26 DIAGNOSIS — I10 Essential (primary) hypertension: Secondary | ICD-10-CM | POA: Diagnosis not present

## 2019-08-26 DIAGNOSIS — R29810 Facial weakness: Secondary | ICD-10-CM | POA: Diagnosis not present

## 2019-08-26 DIAGNOSIS — J9811 Atelectasis: Secondary | ICD-10-CM | POA: Diagnosis not present

## 2019-08-26 DIAGNOSIS — Y999 Unspecified external cause status: Secondary | ICD-10-CM | POA: Insufficient documentation

## 2019-08-26 DIAGNOSIS — F5101 Primary insomnia: Secondary | ICD-10-CM

## 2019-08-26 LAB — I-STAT VENOUS BLOOD GAS, ED
Acid-Base Excess: 4 mmol/L — ABNORMAL HIGH (ref 0.0–2.0)
Bicarbonate: 29.8 mmol/L — ABNORMAL HIGH (ref 20.0–28.0)
Calcium, Ion: 1.21 mmol/L (ref 1.15–1.40)
HCT: 40 % (ref 36.0–46.0)
Hemoglobin: 13.6 g/dL (ref 12.0–15.0)
O2 Saturation: 76 %
Potassium: 4.8 mmol/L (ref 3.5–5.1)
Sodium: 141 mmol/L (ref 135–145)
TCO2: 31 mmol/L (ref 22–32)
pCO2, Ven: 50.5 mmHg (ref 44.0–60.0)
pH, Ven: 7.379 (ref 7.250–7.430)
pO2, Ven: 42 mmHg (ref 32.0–45.0)

## 2019-08-26 LAB — COMPREHENSIVE METABOLIC PANEL
ALT: 29 U/L (ref 0–44)
AST: 61 U/L — ABNORMAL HIGH (ref 15–41)
Albumin: 3.3 g/dL — ABNORMAL LOW (ref 3.5–5.0)
Alkaline Phosphatase: 84 U/L (ref 38–126)
Anion gap: 11 (ref 5–15)
BUN: 11 mg/dL (ref 8–23)
CO2: 25 mmol/L (ref 22–32)
Calcium: 9.3 mg/dL (ref 8.9–10.3)
Chloride: 106 mmol/L (ref 98–111)
Creatinine, Ser: 1.27 mg/dL — ABNORMAL HIGH (ref 0.44–1.00)
GFR calc Af Amer: 53 mL/min — ABNORMAL LOW (ref >60)
GFR calc non Af Amer: 46 mL/min — ABNORMAL LOW (ref >60)
Glucose, Bld: 140 mg/dL — ABNORMAL HIGH (ref 70–99)
Potassium: 4.8 mmol/L (ref 3.5–5.1)
Sodium: 142 mmol/L (ref 135–145)
Total Bilirubin: 0.9 mg/dL (ref 0.3–1.2)
Total Protein: 6.6 g/dL (ref 6.5–8.1)

## 2019-08-26 LAB — PROTIME-INR
INR: 1 (ref 0.8–1.2)
Prothrombin Time: 13.1 seconds (ref 11.4–15.2)

## 2019-08-26 LAB — COOXEMETRY PANEL
Carboxyhemoglobin: 8.9 % (ref 0.5–1.5)
Carboxyhemoglobin: 1.2 % (ref 0.5–1.5)
Methemoglobin: 1.1 % (ref 0.0–1.5)
Methemoglobin: 1 % (ref 0.0–1.5)
O2 Saturation: 99.5 %
O2 Saturation: 89.5 %
Total hemoglobin: 12.6 g/dL (ref 12.0–16.0)
Total hemoglobin: 14.1 g/dL (ref 12.0–16.0)

## 2019-08-26 LAB — APTT: aPTT: 28 seconds (ref 24–36)

## 2019-08-26 LAB — CBC
HCT: 38.7 % (ref 36.0–46.0)
Hemoglobin: 12.6 g/dL (ref 12.0–15.0)
MCH: 27.8 pg (ref 26.0–34.0)
MCHC: 32.6 g/dL (ref 30.0–36.0)
MCV: 85.4 fL (ref 80.0–100.0)
Platelets: 242 10*3/uL (ref 150–400)
RBC: 4.53 MIL/uL (ref 3.87–5.11)
RDW: 13.2 % (ref 11.5–15.5)
WBC: 8.3 10*3/uL (ref 4.0–10.5)
nRBC: 0 % (ref 0.0–0.2)

## 2019-08-26 LAB — I-STAT CHEM 8, ED
BUN: 15 mg/dL (ref 8–23)
Calcium, Ion: 1.23 mmol/L (ref 1.15–1.40)
Chloride: 104 mmol/L (ref 98–111)
Creatinine, Ser: 1.2 mg/dL — ABNORMAL HIGH (ref 0.44–1.00)
Glucose, Bld: 136 mg/dL — ABNORMAL HIGH (ref 70–99)
HCT: 40 % (ref 36.0–46.0)
Hemoglobin: 13.6 g/dL (ref 12.0–15.0)
Potassium: 4.8 mmol/L (ref 3.5–5.1)
Sodium: 142 mmol/L (ref 135–145)
TCO2: 27 mmol/L (ref 22–32)

## 2019-08-26 LAB — CBG MONITORING, ED: Glucose-Capillary: 131 mg/dL — ABNORMAL HIGH (ref 70–99)

## 2019-08-26 LAB — ECHOCARDIOGRAM COMPLETE
Height: 69 in
Weight: 2934.76 oz

## 2019-08-26 LAB — DIFFERENTIAL
Abs Immature Granulocytes: 0.01 10*3/uL (ref 0.00–0.07)
Basophils Absolute: 0.1 10*3/uL (ref 0.0–0.1)
Basophils Relative: 1 %
Eosinophils Absolute: 0 10*3/uL (ref 0.0–0.5)
Eosinophils Relative: 0 %
Immature Granulocytes: 0 %
Lymphocytes Relative: 16 %
Lymphs Abs: 1.4 10*3/uL (ref 0.7–4.0)
Monocytes Absolute: 0.4 10*3/uL (ref 0.1–1.0)
Monocytes Relative: 5 %
Neutro Abs: 6.4 10*3/uL (ref 1.7–7.7)
Neutrophils Relative %: 78 %

## 2019-08-26 LAB — ETHANOL: Alcohol, Ethyl (B): <10 mg/dL (ref <10)

## 2019-08-26 LAB — RAPID URINE DRUG SCREEN, HOSP PERFORMED
Amphetamines: NOT DETECTED
Barbiturates: NOT DETECTED
Benzodiazepines: POSITIVE — AB
Cocaine: NOT DETECTED
Opiates: NOT DETECTED
Tetrahydrocannabinol: NOT DETECTED

## 2019-08-26 LAB — ACETAMINOPHEN LEVEL: Acetaminophen (Tylenol), Serum: <10 ug/mL — ABNORMAL LOW (ref 10–30)

## 2019-08-26 LAB — SARS CORONAVIRUS 2 BY RT PCR (HOSPITAL ORDER, PERFORMED IN ~~LOC~~ HOSPITAL LAB): SARS Coronavirus 2: NEGATIVE

## 2019-08-26 LAB — SALICYLATE LEVEL: Salicylate Lvl: <7 mg/dL — ABNORMAL LOW (ref 7.0–30.0)

## 2019-08-26 IMAGING — CT CT HEAD CODE STROKE
3 series · 14 of 47 positions shown, 16 images · non-contrast
Comparison: None available.

CLINICAL DATA: Code stroke. Initial evaluation for possible stroke,
left-sided weakness with left facial droop.

EXAM:
CT HEAD WITHOUT CONTRAST
TECHNIQUE: Contiguous axial images were obtained from the base of the skull
through the vertex without intravenous contrast.

[Series 3: head 5.0 st · axial · 0.43mm/px · z∈[+381,+521]mm · 8 of 34 slices shown, 10 images]
[im 3/34  brain]
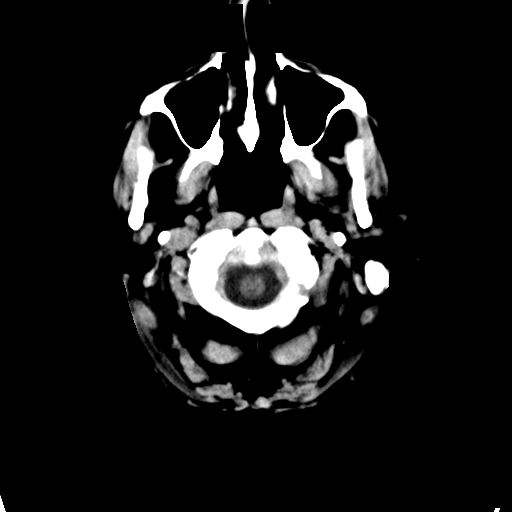
[im 3/34  bone]
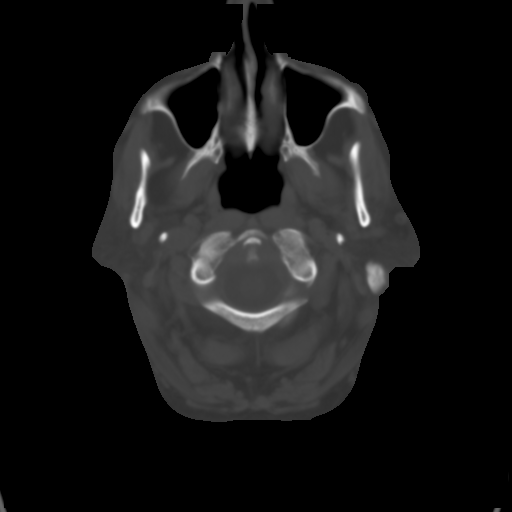
[im 7/34  brain]
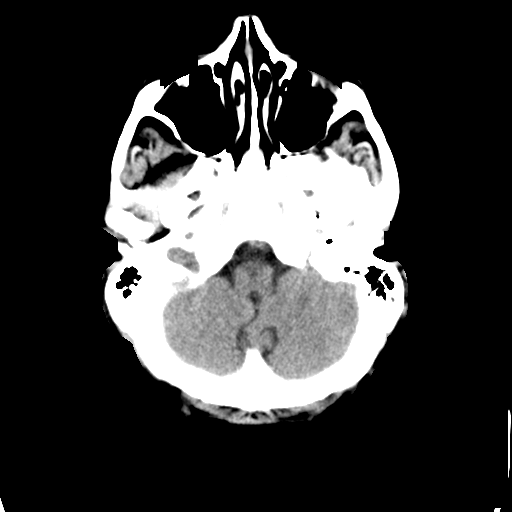
[im 11/34  brain]
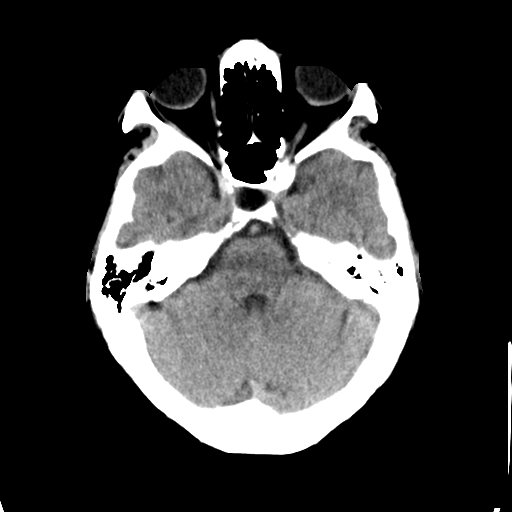
[im 15/34  brain]
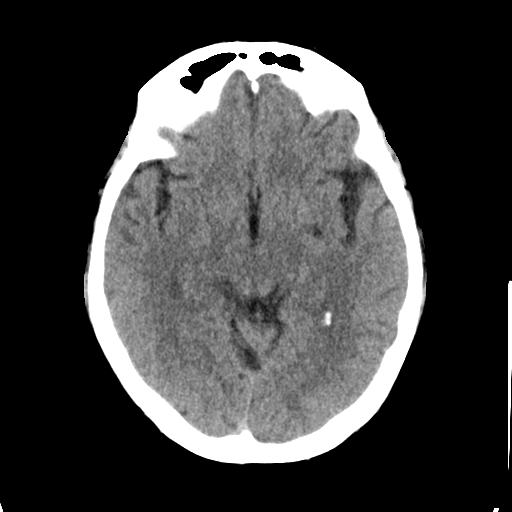
[im 19/34  brain]
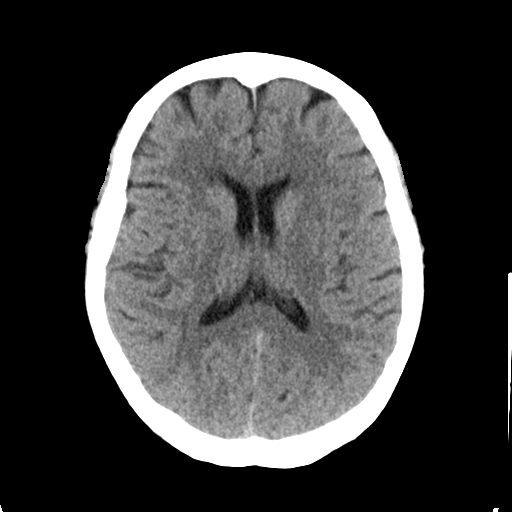
[im 19/34  bone]
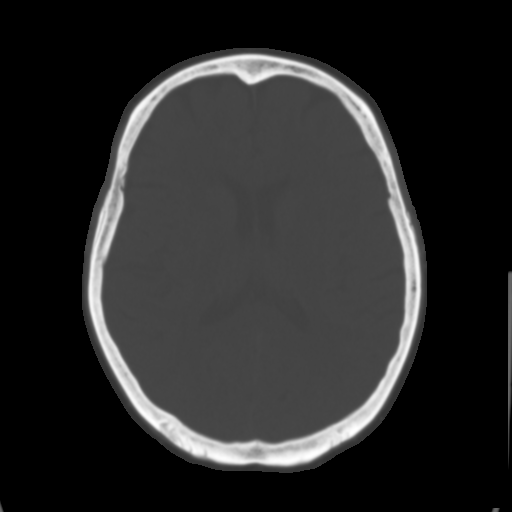
[im 23/34  brain]
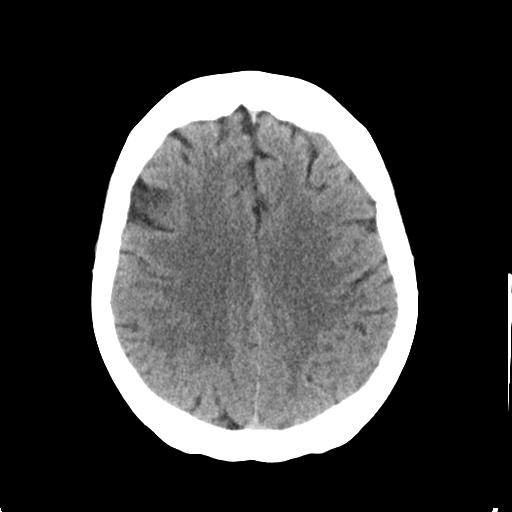
[im 27/34  brain]
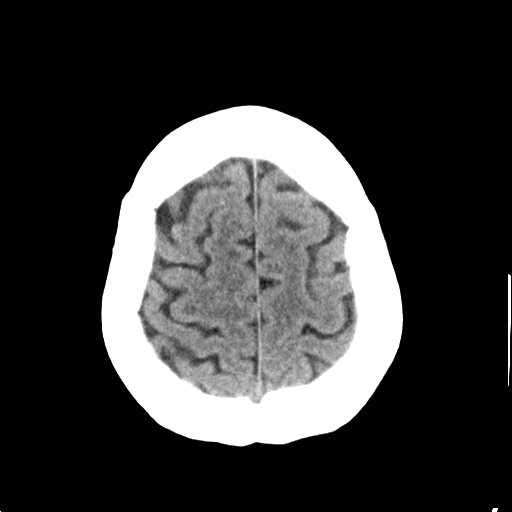
[im 31/34  brain]
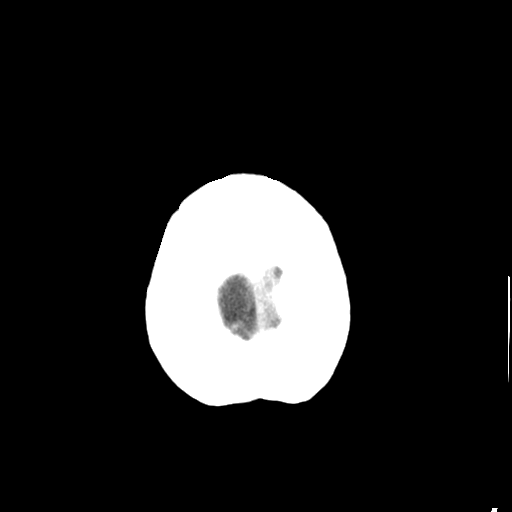

[Series 5: head 3.0 cor st · coronal · 0.32mm/px · 3 of 67 slices shown]
[im 23/67  brain]
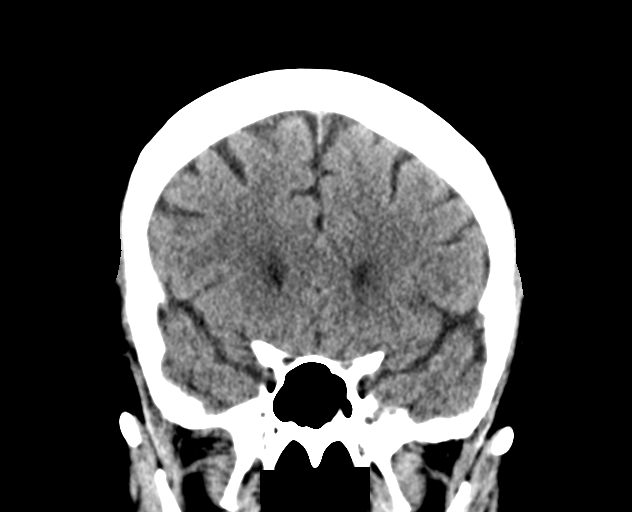
[im 30/67  brain]
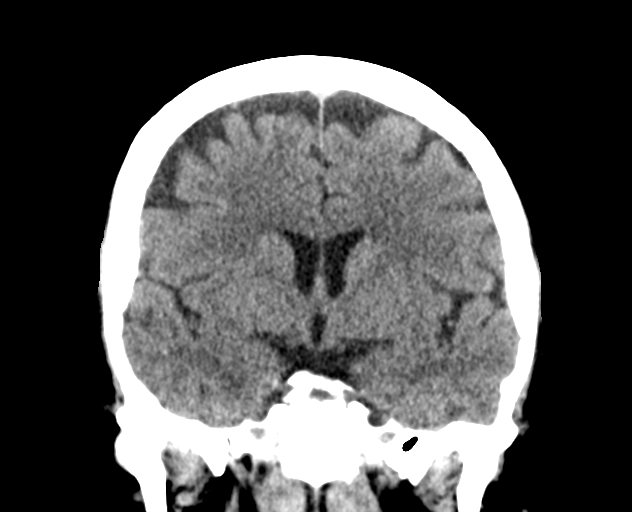
[im 37/67  brain]
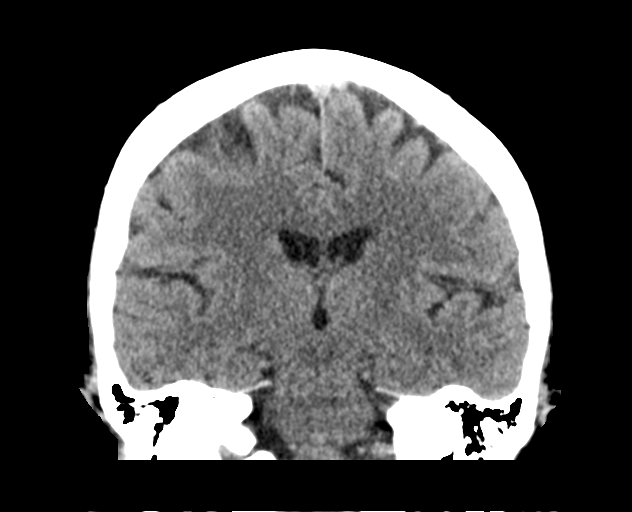

[Series 6: head 3.0 sag st · sagittal · 0.29mm/px · 3 of 54 slices shown]
[im 18/54  brain]
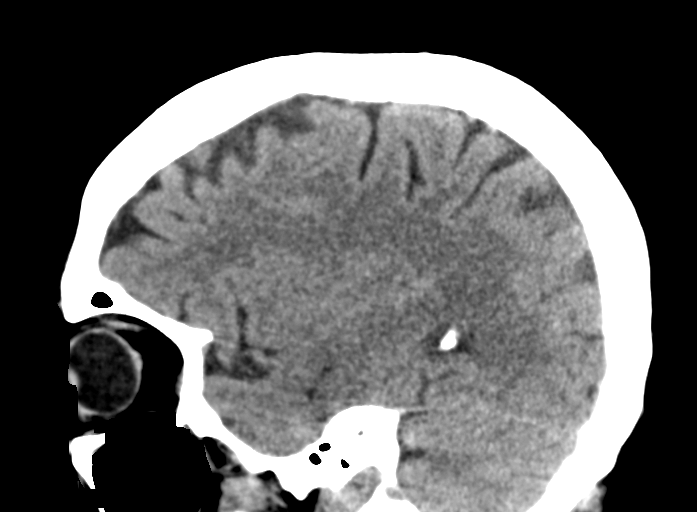
[im 27/54  brain]
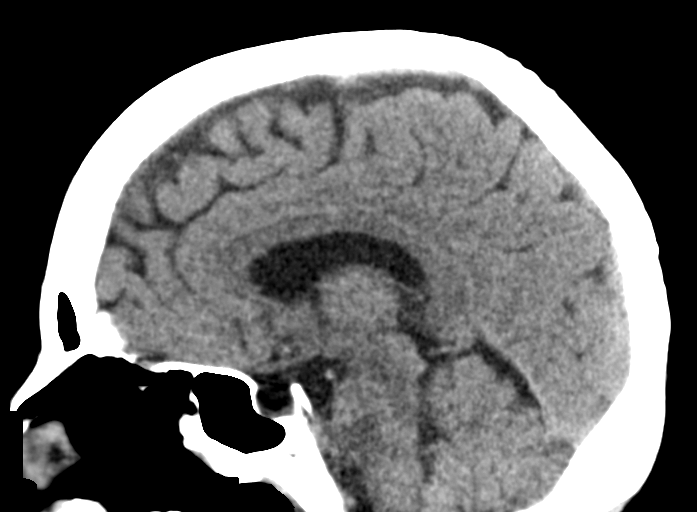
[im 36/54  brain]
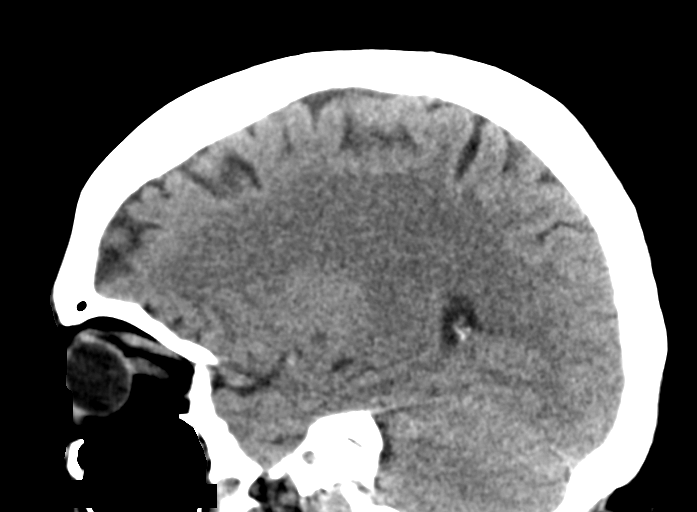

[14 of 47 positions shown; findings below may reference images not displayed]

FINDINGS: Brain: Cerebral volume within normal limits for age. No acute
intracranial hemorrhage. No acute large vessel territory infarct.
Subcentimeter lucency at the inferior aspect of the left lentiform
nucleus favored to reflect a small dilated perivascular space. No
mass lesion, midline shift or mass effect. No hydrocephalus or
extra-axial fluid collection. At least partially empty sella noted.

Vascular: No hyperdense vessel.

Skull: Scalp soft tissues and calvarium within normal limits.

Sinuses/Orbits: Globes and orbital soft tissues demonstrate no acute
finding. Paranasal sinuses are clear. Trace chronic right mastoid
effusion noted, of doubtful significance.

Other: None.

ASPECTS (Alberta Stroke Program Early CT Score)

- Ganglionic level infarction (caudate, lentiform nuclei, internal
capsule, insula, M1-M3 cortex): 7

- Supraganglionic infarction (M4-M6 cortex): 3

Total score (0-10 with 10 being normal): 10
IMPRESSION: 1. Negative head CT.  No acute intracranial abnormality identified.
2. ASPECTS is 10.

Critical Value/emergent results were called by telephone at the time
of interpretation on [DATE] at [DATE] to provider Dr. RAFE,
who verbally acknowledged these results.

## 2019-08-26 IMAGING — MR MR HEAD W/O CM
12 of 13 series · 44 of 48 positions shown · non-contrast
Comparison: CT and CTA from earlier today

CLINICAL DATA: Left-sided weakness and left facial droop.

EXAM:
MRI HEAD WITHOUT CONTRAST
TECHNIQUE: Multiplanar, multiecho pulse sequences of the brain and surrounding
structures were obtained without intravenous contrast.

[Series 5: DWI · axial · 3.0mm · 0.88mm/px · z∈[-44,+92]mm · 9 of 104 slices shown (1 of 4)]
[im 1/104]
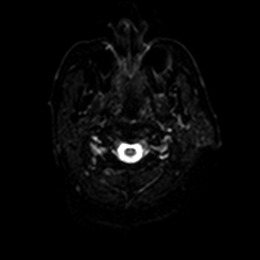
[im 13/104]
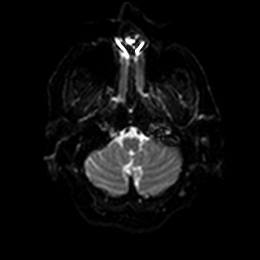
[im 26/104]
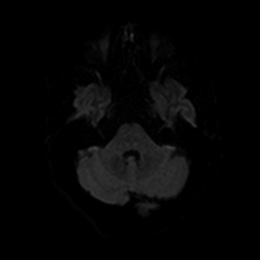
[im 39/104]
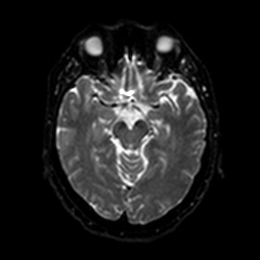
[im 52/104]
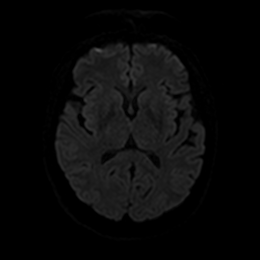
[im 65/104]
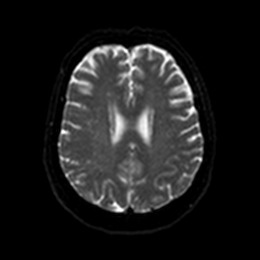
[im 78/104]
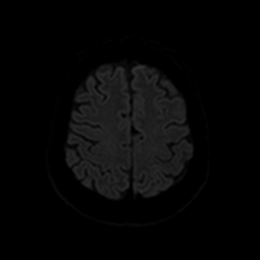
[im 91/104]
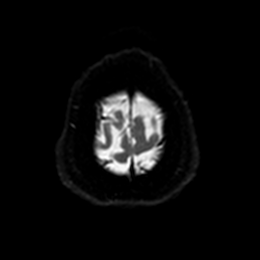
[im 104/104]
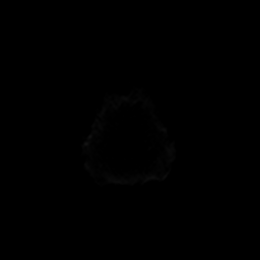

[Series 6: DWI · axial · 3.0mm · 0.88mm/px · z∈[-44,+92]mm · 4 of 52 slices shown (2 of 4)]
[im 1/52]
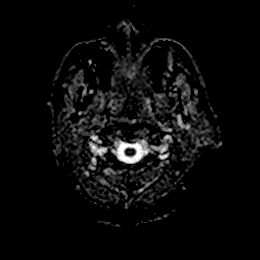
[im 18/52]
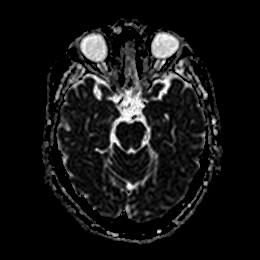
[im 35/52]
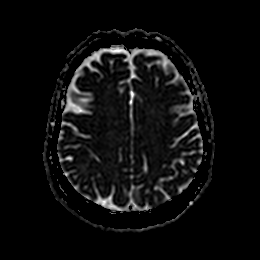
[im 52/52]
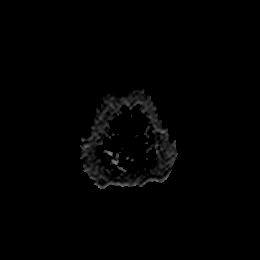

[Series 7: DWI · coronal · 4.0mm · 0.88mm/px · 5 of 64 slices shown (3 of 4)]
[im 1/64]
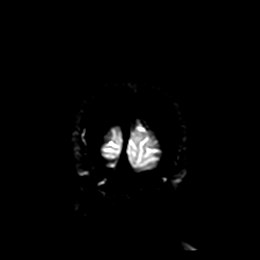
[im 16/64]
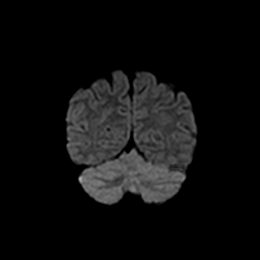
[im 32/64]
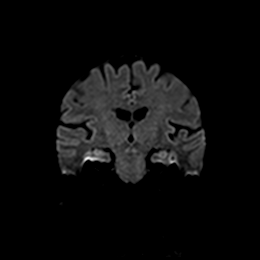
[im 48/64]
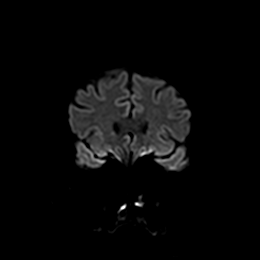
[im 64/64]
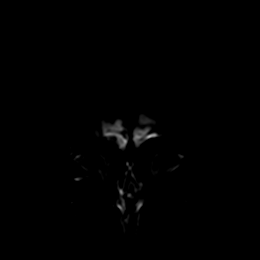

[Series 8: DWI · coronal · 4.0mm · 0.88mm/px · 2 of 32 slices shown (4 of 4)]
[im 1/32]
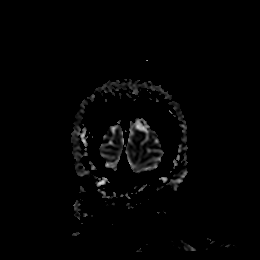
[im 32/32]
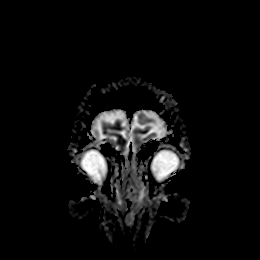

[Series 9: T1 · sagittal · 5.0mm · 0.75mm/px · 2 of 23 slices shown]
[im 1/23]
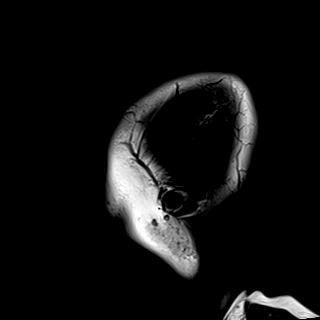
[im 23/23]
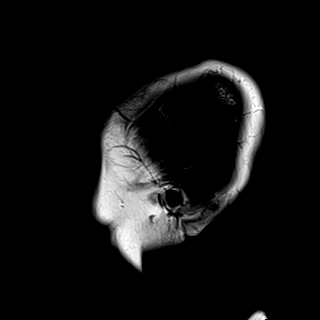

[Series 10: T2 · axial · 5.0mm · 0.72mm/px · z∈[-45,+93]mm · 2 of 27 slices shown (1 of 2)]
[im 1/27]
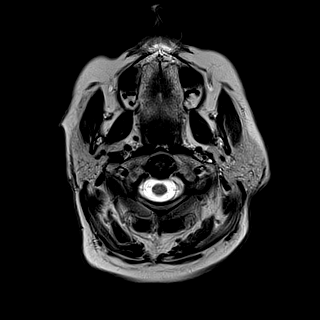
[im 27/27]
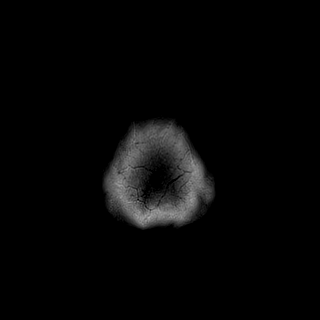

[Series 11: FLAIR · axial · 5.0mm · 0.45mm/px · z∈[-47,+91]mm · 2 of 27 slices shown]
[im 1/27]
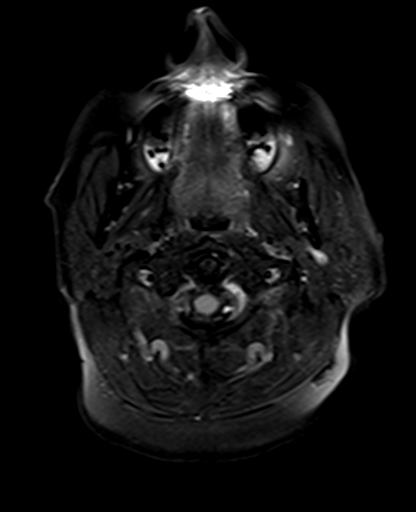
[im 27/27]
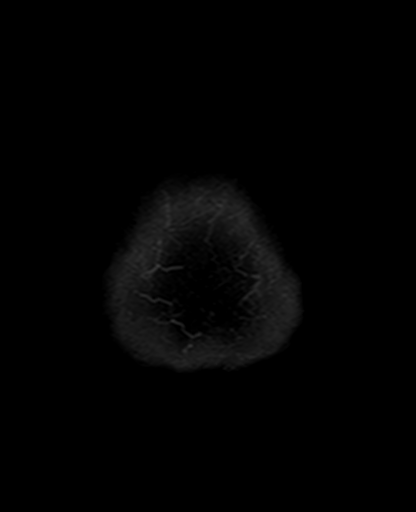

[Series 12: mag_images · axial · 3.0mm · 0.90mm/px · z∈[-55,+102]mm · 4 of 60 slices shown]
[im 1/60]
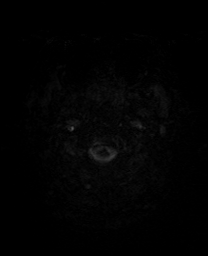
[im 20/60]
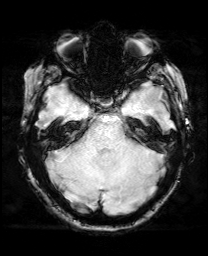
[im 40/60]
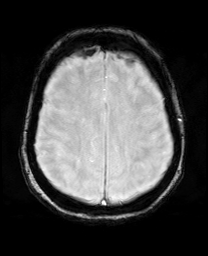
[im 60/60]
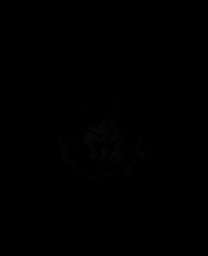

[Series 13: pha_images · axial · 3.0mm · 0.90mm/px · z∈[-55,+102]mm · 4 of 60 slices shown]
[im 1/60]
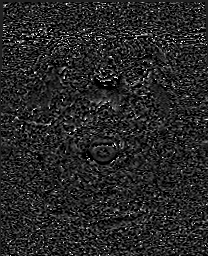
[im 20/60]
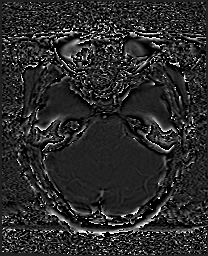
[im 40/60]
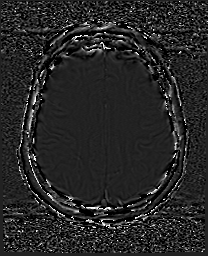
[im 60/60]
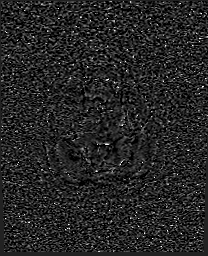

[Series 14: swi_images · axial · 3.0mm · 0.90mm/px · z∈[-55,+102]mm · 4 of 60 slices shown]
[im 1/60]
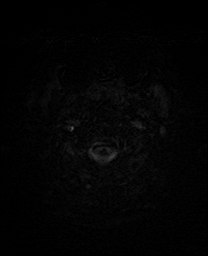
[im 20/60]
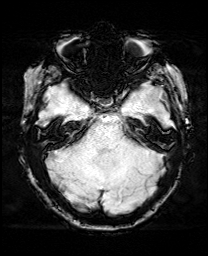
[im 40/60]
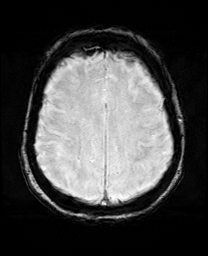
[im 60/60]
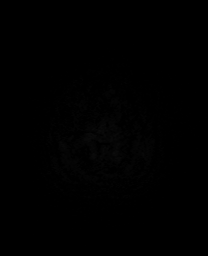

[Series 15: mip_images(sw) · axial · 24.0mm · 0.90mm/px · z∈[-46,+93]mm · 4 of 53 slices shown]
[im 1/53]
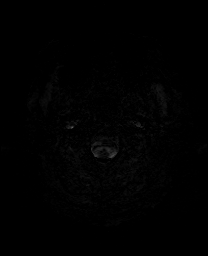
[im 18/53]
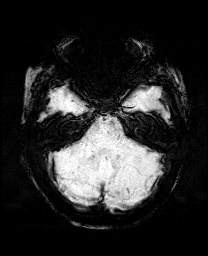
[im 35/53]
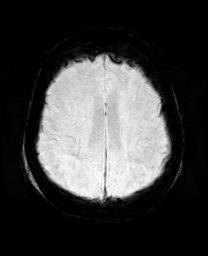
[im 53/53]
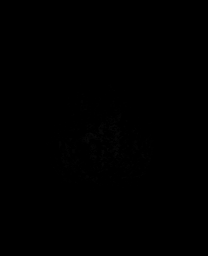

[Series 17: T2 · coronal · 5.0mm · 0.34mm/px · 2 of 29 slices shown (2 of 2)]
[im 1/29]
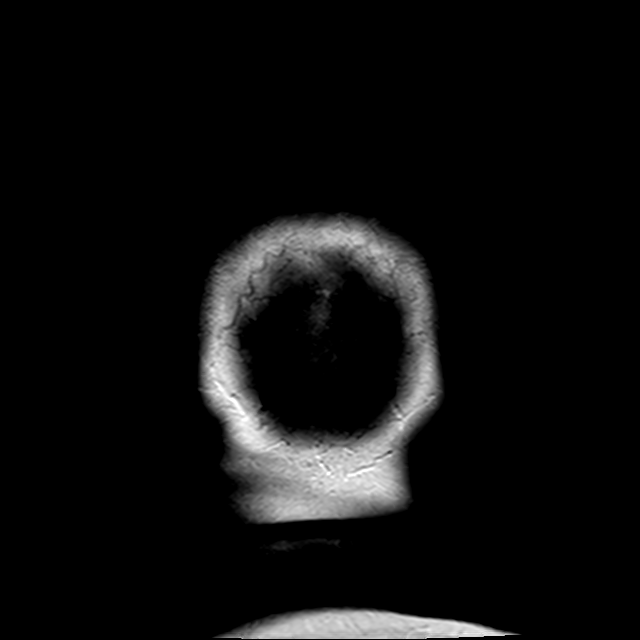
[im 29/29]
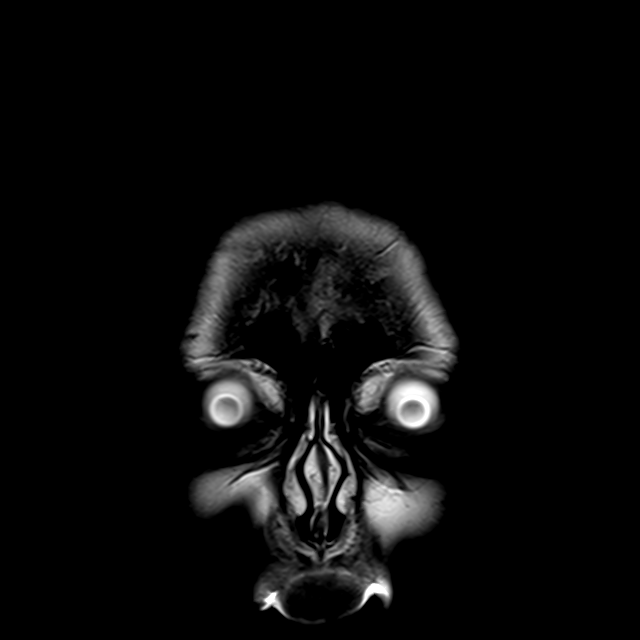

[44 of 48 positions shown; findings below may reference images not displayed]

FINDINGS: Brain: No acute infarction, hemorrhage, hydrocephalus, extra-axial
collection or mass lesion. Normal brain volume. Mild chronic small
vessel ischemic type change in the cerebral white matter and
pons-patient has multiple vascular risk factors. The bilateral
globus pallidus has a normal appearance.

Vascular: Normal flow voids

Skull and upper cervical spine: Negative

Sinuses/Orbits: Partial right mastoid opacification.
IMPRESSION: 1. No acute finding.  No MR findings of carbon monoxide poisoning.
2. Mild chronic small vessel ischemia.

## 2019-08-26 IMAGING — CT CT ANGIO NECK
1 of 16 series · 2 of 33 positions shown · IV contrast (OMNI 350)
Comparison: Prior head CT from earlier the same day.

CLINICAL DATA: Initial evaluation for acute left facial droop,
left-sided weakness.

EXAM:
CT ANGIOGRAPHY HEAD AND NECK
TECHNIQUE: Multidetector CT imaging of the head and neck was performed using
the standard protocol during bolus administration of intravenous
contrast. Multiplanar CT image reconstructions and MIPs were
obtained to evaluate the vascular anatomy. Carotid stenosis
measurements (when applicable) are obtained utilizing NASCET
criteria, using the distal internal carotid diameter as the
denominator.
CONTRAST:  75mL OMNIPAQUE IOHEXOL 350 MG/ML SOLN

[Series 9: arterial thins · axial · arterial · 0.49mm/px · z∈[+272,+395]mm · 2 of 362 slices shown]
[im 121/362  soft-tissue]
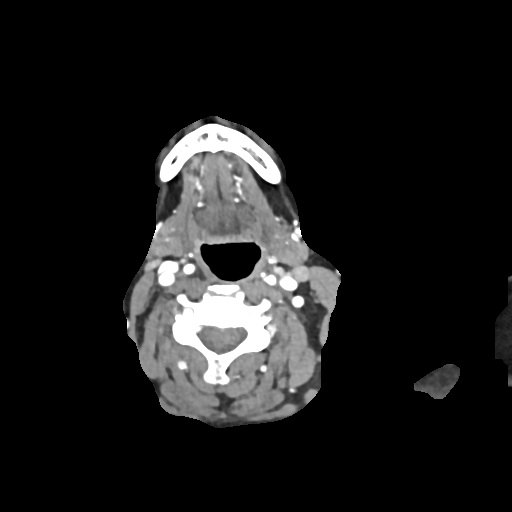
[im 241/362  bone]
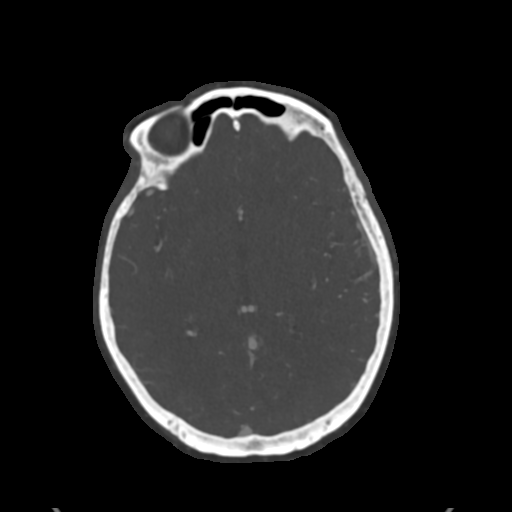

[2 of 33 positions shown; findings below may reference images not displayed]

FINDINGS: CTA NECK FINDINGS

Aortic arch: Partially visualized aortic arch of normal caliber with
normal branch pattern. No flow-limiting stenosis seen about the
origin of the great vessels. Visualized subclavian arteries widely
patent.

Right carotid system: Right common carotid artery widely patent from
its origin to the bifurcation without stenosis. Mild eccentric
plaque about the right bifurcation without significant stenosis.
Right ICA widely patent distally to the skull base without stenosis,
dissection or occlusion.

Left carotid system: Left CCA widely patent from its origin to the
bifurcation without stenosis. Mild atheromatous change about the
left bifurcation without significant stenosis. Left ICA widely
patent distally to the skull base without stenosis, dissection or
occlusion.

Vertebral arteries: Both vertebral arteries arise from the
subclavian arteries. Vertebral arteries widely patent within the
neck without stenosis, dissection or occlusion.

Skeleton: No visible acute osseous abnormality, although evaluation
somewhat limited by lack of sagittal MPR sequence. No discrete or
worrisome osseous lesions.

Other neck: No other acute soft tissue abnormality within the neck.
No mass lesion or adenopathy.

Upper chest: Visualized upper chest demonstrates no acute finding.
Subcentimeter calcified granuloma noted at the left upper lobe.

Review of the MIP images confirms the above findings

CTA HEAD FINDINGS

Anterior circulation: Petrous, cavernous, and supraclinoid ICAs
widely patent without stenosis or other abnormality. A1 segments
patent bilaterally. Normal anterior communicating artery complex.
Anterior cerebral arteries patent to their distal aspects without
stenosis. No M1 stenosis or occlusion. Negative MCA bifurcations.
Distal MCA branches well perfused and symmetric.

Posterior circulation: Vertebral arteries patent to the
vertebrobasilar junction without stenosis. Both picas patent.
Basilar patent to its distal aspect without stenosis. Superior
cerebral arteries patent bilaterally. Both PCAs primarily supplied
via the basilar well perfused to their distal aspects. Small right
posterior communicating artery noted.

Venous sinuses: Grossly patent allowing for timing the contrast
bolus.

Anatomic variants: None significant.  No visible aneurysm.

Review of the MIP images confirms the above findings
IMPRESSION: 1. Negative CTA for emergent large vessel occlusion.
2. Mild atheromatous change about the carotid bifurcations without
significant stenosis.
3. No other hemodynamically significant or correctable stenosis
identified about the major arterial vasculature of the head and
neck.

## 2019-08-26 IMAGING — DX DG CHEST 1V PORT
1 series · 1 of 1 positions shown · non-contrast
Comparison: Chest radiograph dated [DATE].

CLINICAL DATA: 59-year-old female with code stroke.

EXAM:
PORTABLE CHEST 1 VIEW

[chest ap]
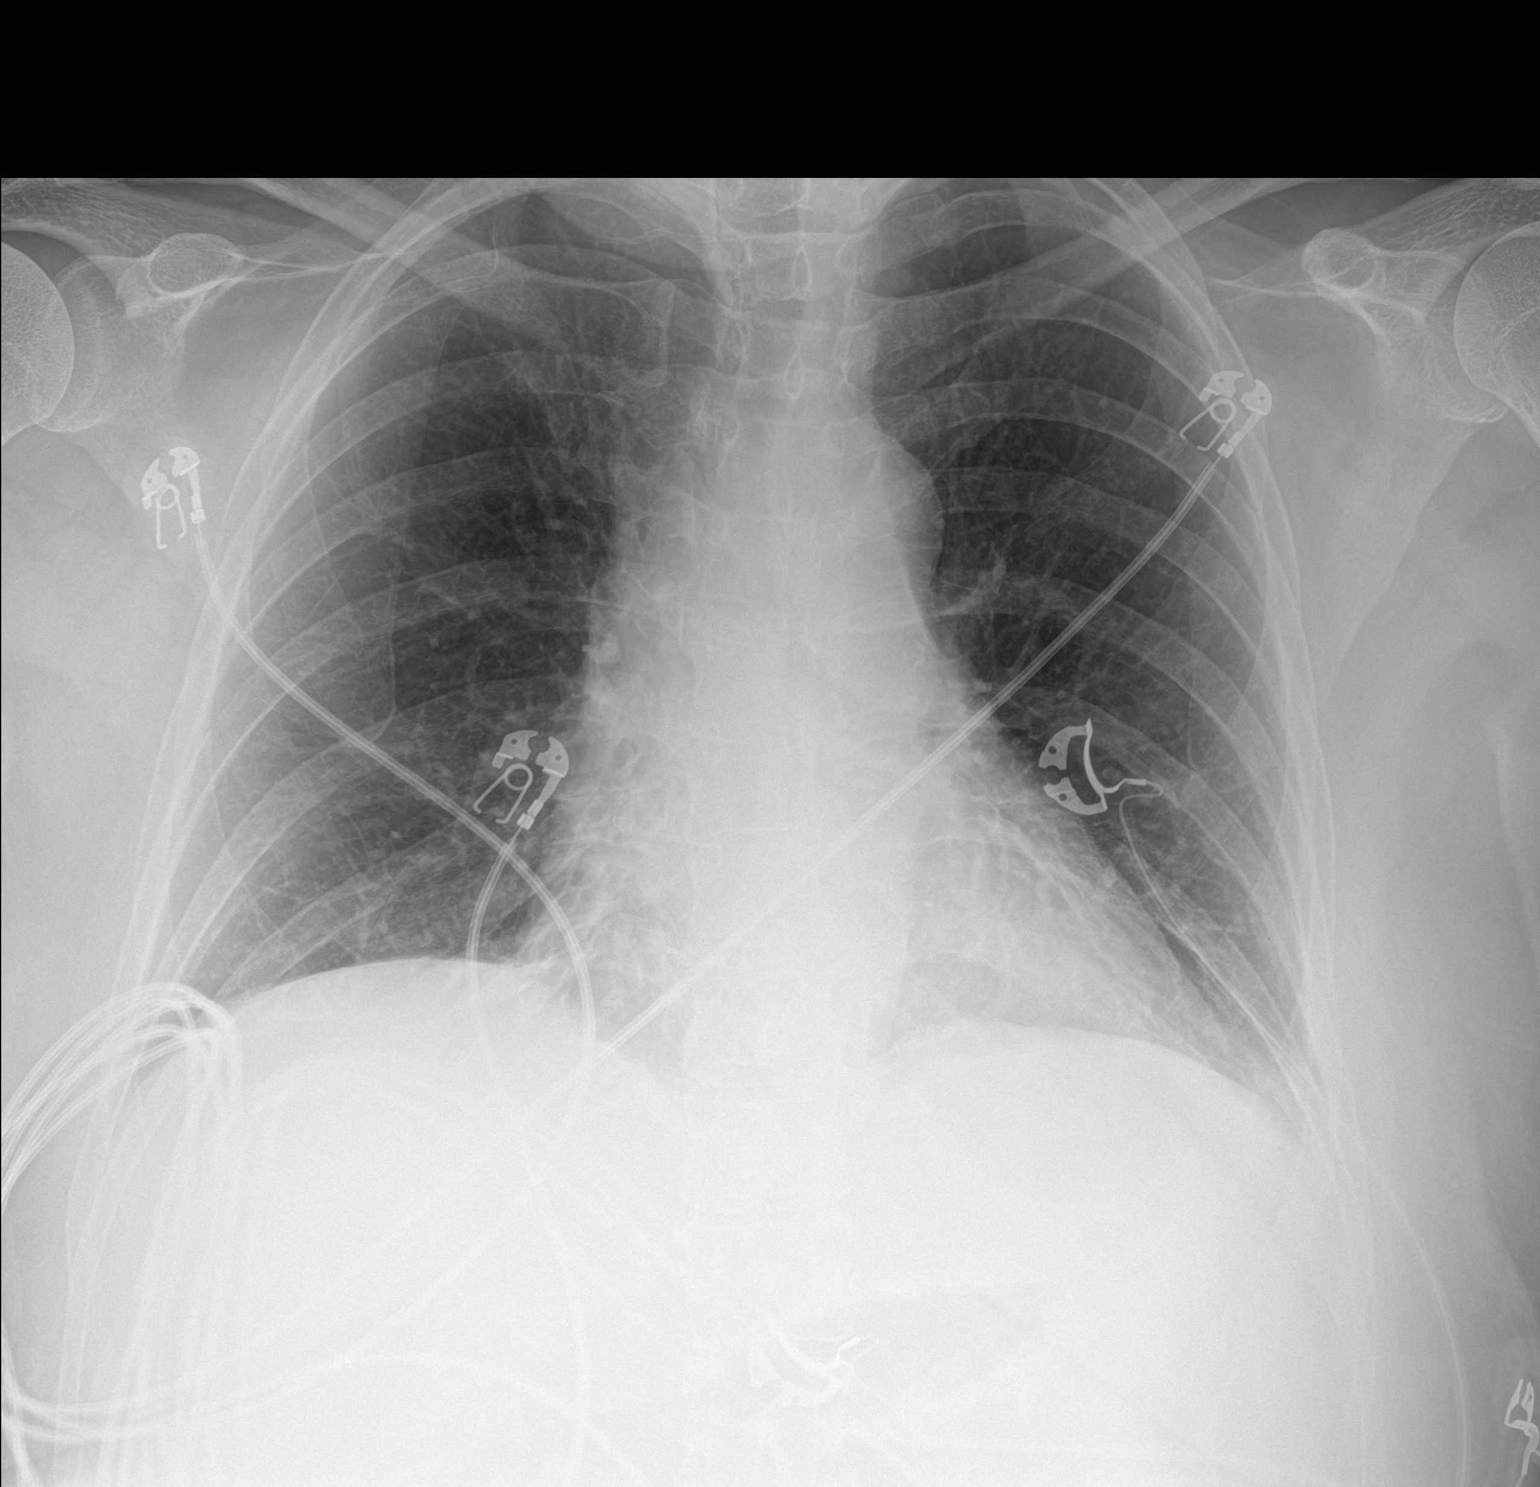

[1 of 1 positions shown; findings below may reference images not displayed]

FINDINGS: Minimal left lung base atelectasis. No focal consolidation, pleural
effusion, or pneumothorax. The cardiac silhouette is within limits.
No acute osseous pathology.
IMPRESSION: No active disease.

## 2019-08-26 MED ORDER — LEVOTHYROXINE SODIUM 100 MCG PO TABS
100.0000 ug | ORAL_TABLET | Freq: Every day | ORAL | Status: DC
  Administered 2019-08-26 – 2019-08-27 (×2): 100 ug via ORAL
  Filled 2019-08-26 (×2): qty 1

## 2019-08-26 MED ORDER — DOXEPIN HCL 25 MG PO CAPS
25.0000 mg | ORAL_CAPSULE | Freq: Every day | ORAL | Status: DC
  Administered 2019-08-26: 50 mg via ORAL
  Filled 2019-08-26 (×2): qty 2

## 2019-08-26 MED ORDER — ACETAMINOPHEN 650 MG RE SUPP: 650.0000 mg | RECTAL | Status: DC | PRN

## 2019-08-26 MED ORDER — PERFLUTREN LIPID MICROSPHERE
1.0000 mL | INTRAVENOUS | Status: AC | PRN
  Administered 2019-08-26: 4 mL via INTRAVENOUS
  Filled 2019-08-26: qty 10

## 2019-08-26 MED ORDER — STROKE: EARLY STAGES OF RECOVERY BOOK: Freq: Once | Status: AC

## 2019-08-26 MED ORDER — PRAVASTATIN SODIUM 10 MG PO TABS
10.0000 mg | ORAL_TABLET | Freq: Every day | ORAL | Status: DC
  Administered 2019-08-26 – 2019-08-27 (×2): 10 mg via ORAL
  Filled 2019-08-26 (×2): qty 1

## 2019-08-26 MED ORDER — DOCUSATE SODIUM 100 MG PO CAPS
100.0000 mg | ORAL_CAPSULE | Freq: Every day | ORAL | Status: DC
  Administered 2019-08-26 – 2019-08-27 (×2): 100 mg via ORAL
  Filled 2019-08-26: qty 1

## 2019-08-26 MED ORDER — IOHEXOL 350 MG/ML SOLN
75.0000 mL | Freq: Once | INTRAVENOUS | Status: AC | PRN
  Administered 2019-08-26: 75 mL via INTRAVENOUS

## 2019-08-26 MED ORDER — GABAPENTIN 100 MG PO CAPS
100.0000 mg | ORAL_CAPSULE | Freq: Two times a day (BID) | ORAL | Status: DC
  Administered 2019-08-26 – 2019-08-27 (×3): 100 mg via ORAL
  Filled 2019-08-26 (×3): qty 1

## 2019-08-26 MED ORDER — LORAZEPAM 2 MG/ML IJ SOLN
1.0000 mg | Freq: Once | INTRAMUSCULAR | Status: AC
  Administered 2019-08-26: 1 mg via INTRAVENOUS
  Filled 2019-08-26: qty 1

## 2019-08-26 MED ORDER — SODIUM CHLORIDE 0.9% FLUSH
3.0000 mL | Freq: Once | INTRAVENOUS | Status: AC
  Administered 2019-08-26: 3 mL via INTRAVENOUS

## 2019-08-26 MED ORDER — LORAZEPAM 1 MG PO TABS
1.0000 mg | ORAL_TABLET | Freq: Three times a day (TID) | ORAL | Status: DC | PRN
  Administered 2019-08-26 (×2): 1 mg via ORAL
  Filled 2019-08-26 (×2): qty 1

## 2019-08-26 MED ORDER — ZOLPIDEM TARTRATE 5 MG PO TABS: 5.0000 mg | ORAL_TABLET | Freq: Every evening | ORAL | Status: DC | PRN

## 2019-08-26 MED ORDER — ENOXAPARIN SODIUM 40 MG/0.4ML ~~LOC~~ SOLN
40.0000 mg | SUBCUTANEOUS | Status: DC
  Administered 2019-08-26: 40 mg via SUBCUTANEOUS
  Filled 2019-08-26: qty 0.4

## 2019-08-26 MED ORDER — ACETAMINOPHEN 325 MG PO TABS
650.0000 mg | ORAL_TABLET | ORAL | Status: DC | PRN
  Administered 2019-08-26: 650 mg via ORAL
  Filled 2019-08-26: qty 2

## 2019-08-26 MED ORDER — SODIUM CHLORIDE 0.9 % IV SOLN: Freq: Once | INTRAVENOUS | Status: AC

## 2019-08-26 MED ORDER — ACETAMINOPHEN 160 MG/5ML PO SOLN: 650.0000 mg | ORAL | Status: DC | PRN

## 2019-08-26 MED ORDER — PANTOPRAZOLE SODIUM 40 MG PO TBEC
40.0000 mg | DELAYED_RELEASE_TABLET | Freq: Every day | ORAL | Status: DC | PRN
  Administered 2019-08-26: 40 mg via ORAL
  Filled 2019-08-26: qty 1

## 2019-08-26 NOTE — Consult Note (Signed)
Referring Physician: Dr. Leonides Schanz    Chief Complaint:  Left sided weakness  HPI: Kathleen Sweeney is an 59 y.o. female with a PMHx of CKD, fibromyalgia, HLD, HTN, hypothyroidism, lupus, migraines and RLS, who presents to the ED via EMS as a Code Stroke after she was found in her garage by fire department personnel with left sided weakness and facial droop. LKN was 1800 when she had spoken with her husband. She then apparently went to her basement/garage and started the mower with the outside door open. She had been planning to mow the grass. She apparently left her mower running on purpose while it was still inside the garage. She states she walked to a recliner due to feeling tired and then passed out. Apparently a neighbor called the fire department when the fire alarm went off at the patient's house. It was thought that the alarm went off due to exhaust from the mower which was left running. The patient thinks that she may have been unconscious for 2-3 hours. Per EMS, her CBG was 111 and BP was 111/72.   LSN: 1800 tPA Given: No: Out of time window  CT shows no acute abnormality.   CTA of head and neck shows no LVO.   Past Medical History:  Diagnosis Date  . CKD (chronic kidney disease)   . Depression   . Facial droop   . Fibromyalgia   . Hyperlipidemia   . Hypertension   . Hypothyroid   . IBS (irritable bowel syndrome)   . Insomnia   . Lupus ([place])   . Migraine   . RLS (restless legs syndrome)   . Skin cancer Had cancer removed in 2000  . Thyroid disease     Past Surgical History:  Procedure Laterality Date  . ABDOMINAL HYSTERECTOMY  2008  . APPENDECTOMY  1998  . thoracic tumor  03/1995   excision    Family History  Problem Relation Age of Onset  . Sleep apnea Mother   . Irregular heart beat Mother        A fib  . Hypertension Mother   . Diabetes Mother   . Dementia Mother   . Pneumonia Father   . Sleep apnea Brother   . Dementia Maternal Grandmother    Social  History:  reports that she quit smoking about 13 years ago. Her smoking use included cigarettes. She has a 60.00 pack-year smoking history. She has never used smokeless tobacco. She reports previous drug use. Drugs: Marijuana and Other-see comments. She reports that she does not drink alcohol.  Allergies:  Allergies  Allergen Reactions  . Erythromycin Anaphylaxis    Chest pain  . Morphine And Related Nausea And Vomiting    Not given  . Promethazine Nausea And Vomiting    "made her sick"  . Simvastatin Other (See Comments)    Muscle aches    Home Medications:  No current facility-administered medications on file prior to encounter.   Current Outpatient Medications on File Prior to Encounter  Medication Sig Dispense Refill  . acetaminophen (TYLENOL) 325 MG tablet Take 325 mg by mouth 2 (two) times daily as needed for mild pain.    Marland Kitchen docusate sodium (COLACE) 100 MG capsule Take 1 capsule (100 mg total) by mouth daily. 30 capsule 0  . doxepin (SINEQUAN) 25 MG capsule Take 25-50 mg by mouth at bedtime.     . gabapentin (NEURONTIN) 100 MG capsule Take 1 capsule (100 mg total) by mouth 2 (two) times daily  for 14 days. 28 capsule 0  . levothyroxine (SYNTHROID) 100 MCG tablet Take 100 mcg by mouth daily.    Marland Kitchen LORazepam (ATIVAN) 1 MG tablet Take 1 mg by mouth 3 (three) times daily as needed for anxiety or sleep.     . Multiple Vitamin (MULTIVITAMIN) capsule Take 1 capsule by mouth daily.    Marland Kitchen omeprazole (PRILOSEC) 20 MG capsule Take 20 mg by mouth daily as needed.     . polyethylene glycol (MIRALAX) 17 g packet Take 17 g by mouth daily as needed for mild constipation. 14 each 0  . pravastatin (PRAVACHOL) 10 MG tablet Take 10 mg by mouth daily.    . Probiotic CAPS Take 1 capsule by mouth daily. 30 capsule 1  . tiZANidine (ZANAFLEX) 4 MG tablet Take 4 mg by mouth at bedtime as needed for muscle spasms.     Marland Kitchen zolpidem (AMBIEN CR) 12.5 MG CR tablet Take 12.5 mg by mouth at bedtime as needed for  sleep.      ROS: As per HPI. The patient does not endorse any additional symptoms.  Physical Examination: Height 5\' 9"  (1.753 m), weight 83.2 kg.  HEENT: [place]/AT Lungs: Respirations unlabored Ext: No edema  Neurologic Examination: Mental Status: Alert, but drowsy. Oriented to year, month, day of week, city and state. Mild slurring of speech. Comprehension intact for a 3 step directional command and all questions asked. Speech fluent without errors of grammar or syntax. Naming intact. Repetition impaired.  Cranial Nerves: II:  Visual fields intact with no extinction to DSS. PERRL.  III,IV, VI: No ptosis. EOMI with saccadic pursuits noted.  V,VII: Mild left facial droop. Facial temp sensation equal bilaterally VIII: hearing intact to voice IX,X: No hypophonia. XI: Symmetric shoulder shrug XII: Midline tongue extension  Motor: RUE and RLE 5/5 LUE 4+/5 deltoid and biceps. 5/5 triceps and grip strength.  LLE with 5/5 strength proximally and distally.  Sensory: Temp and light touch intact throughout, bilaterally. No extinction to DSS.  Deep Tendon Reflexes:  2+ bilateral brachioradialis, biceps, patellae and achilles Plantars: Right: downgoing   Left: downgoing Cerebellar: No ataxia with FNF bilaterally. There is disdiadochokinesia with RAM on the left. Left heel-shin is ataxic.  Gait: Deferred  Results for orders placed or performed during the hospital encounter of 08/26/19 (from the past 48 hour(s))  CBG monitoring, ED     Status: Abnormal   Collection Time: 08/26/19 12:12 AM  Result Value Ref Range   Glucose-Capillary 131 (H) 70 - 99 mg/dL    Comment: Glucose reference range applies only to samples taken after fasting for at least 8 hours.  Protime-INR     Status: None   Collection Time: 08/26/19 12:21 AM  Result Value Ref Range   Prothrombin Time 13.1 11.4 - 15.2 seconds   INR 1.0 0.8 - 1.2    Comment: (NOTE) INR goal varies based on device and disease states. Performed at  [facility], [place] [address], [place], [place] [address]   APTT     Status: None   Collection Time: 08/26/19 12:21 AM  Result Value Ref Range   aPTT 28 24 - 36 seconds    Comment: Performed at [place] [address], [place] [address]  CBC     Status: None   Collection Time: 08/26/19 12:21 AM  Result Value Ref Range   WBC 8.3 4.0 - 10.5 K/uL   RBC 4.53 3.87 - 5.11 MIL/uL   Hemoglobin 12.6  12.0 - 15.0 g/dL   HCT 38.7 36 - 46 %   MCV 85.4 80.0 - 100.0 fL   MCH 27.8 26.0 - 34.0 pg   MCHC 32.6 30.0 - 36.0 g/dL   RDW 13.2 11.5 - 15.5 %   Platelets 242 150 - 400 K/uL   nRBC 0.0 0.0 - 0.2 %    Comment: Performed at [facility], [place] [address], [place], [place] [address]  Differential     Status: None   Collection Time: 08/26/19 12:21 AM  Result Value Ref Range   Neutrophils Relative % 78 %   Neutro Abs 6.4 1.7 - 7.7 K/uL   Lymphocytes Relative 16 %   Lymphs Abs 1.4 0.7 - 4.0 K/uL   Monocytes Relative 5 %   Monocytes Absolute 0.4 0 - 1 K/uL   Eosinophils Relative 0 %   Eosinophils Absolute 0.0 0 - 0 K/uL   Basophils Relative 1 %   Basophils Absolute 0.1 0 - 0 K/uL   Immature Granulocytes 0 %   Abs Immature Granulocytes 0.01 0.00 - 0.07 K/uL    Comment: Performed at [place] [address], [place], [place] [address]  I-Stat venous blood gas, ED     Status: Abnormal   Collection Time: 08/26/19 12:32 AM  Result Value Ref Range   pH, Ven 7.379 7.25 - 7.43   pCO2, Ven 50.5 44 - 60 mmHg   pO2, Ven 42.0 32 - 45 mmHg   Bicarbonate 29.8 (H) 20.0 - 28.0 mmol/L   TCO2 31 22 - 32 mmol/L   O2 Saturation 76.0 %   Acid-Base Excess 4.0 (H) 0.0 - 2.0 mmol/L   Sodium 141 135 - 145 mmol/L   Potassium 4.8 3.5 - 5.1 mmol/L   Calcium, Ion 1.21 1.15 - 1.40 mmol/L   HCT 40.0 36 - 46 %   Hemoglobin 13.6 12.0 - 15.0 g/dL   Sample type VENOUS   I-stat chem 8, ED     Status: Abnormal   Collection Time: 08/26/19 12:33 AM  Result Value Ref Range    Sodium 142 135 - 145 mmol/L   Potassium 4.8 3.5 - 5.1 mmol/L   Chloride 104 98 - 111 mmol/L   BUN 15 8 - 23 mg/dL   Creatinine, Ser 1.20 (H) 0.44 - 1.00 mg/dL   Glucose, Bld 136 (H) 70 - 99 mg/dL    Comment: Glucose reference range applies only to samples taken after fasting for at least 8 hours.   Calcium, Ion 1.23 1.15 - 1.40 mmol/L   TCO2 27 22 - 32 mmol/L   Hemoglobin 13.6 12.0 - 15.0 g/dL   HCT 40.0 36 - 46 %   No results found.  Assessment: 59 y.o. female presenting with left facial droop, left sided weakness and dysarthria.  1. Above in the context of possible CO exposure. ABG with normal PO2 and PCO2.  2. Exam findings reveal left sided ataxia and left facial droop, as well as difficulty repeating a phrase, mild dysarthria and drowsiness.  3. DDx for presentation includes acute stroke and CO poisoning.  4. Not a tPA candidate due to time criteria. Not a candidate for VIR due to no LVO on CTA.  5. CT head shows no acute abnormality.  6. Stroke Risk Factors - HLD, HTN and lupus 7. Possible AKI on CKD  Recommendations: 1. HgbA1c, fasting lipid panel 2. MRI of the brain without contrast 3. PT consult, OT consult, Speech consult 4.  Echocardiogram 5. Cardiac telemetry 6. Carboxyhemoglobin level.  7. Frequent neuro checks 8. Await results of the above tests to determine if initiation of ASA and/or statin is indicated.  9. Management of possible AKI   @Electronically  signed: Dr. Kerney Elbe 08/26/2019, 12:46 AM

## 2019-08-26 NOTE — ED Notes (Signed)
Able to walk to get on bedside commode

## 2019-08-26 NOTE — ED Notes (Signed)
Pt up with sitter to pt restroom, pt gait steady

## 2019-08-26 NOTE — H&P (Signed)
History and Physical    Kathleen Sweeney IHK:[id number] DOB: Mar 29, 1960 DOA: 08/26/2019  Referring MD/NP/PA: Morton Amy, PA-C PCP: Maurice Small, MD  Patient coming from: Home via EMS  Chief Complaint: Alarms going off  I have personally briefly reviewed patient's old medical records in [place]   HPI: Kathleen Sweeney is a 59 y.o. female with medical history significant of hypertension, hyperlipidemia, hypothyroidism, lupus, RLS, and hypothyroidism presented to the hospital after calling 911 working up to alarms going off.  History is able to be obtained from patient now that she is more alert and awake.  She had taken the lawn mower out of the shed and had had a difficult time trying to get it started.  When she was able to get it started as she tied her mask around the throttle to keep it running.  She reports that she kept tomorrow morning outside of the basement and had gone back into the house to change shoes that she had flip-flops on at the time.  She thinks that she may have gotten something to drink and then somehow went upstairs and fell asleep somehow.  She awoke up to alarms going off and she was unable to figure out why they were going off and call 911.  She reports that she had no thoughts of wanting to hurt her self, but does have a difficult time with insomnia.  Her and her husband have been separated and he is currently staying at an friend's house, but also took their dog.  She had been in disagreement with her husband over the dog who is currently old and dying.  Patient had sent text messages to her son in regards to this situation whom she rarely talks to and at the of the text message said I will always love you.  Review of text messages to her other son also note the same phrase and she notes that that did not mean that she wanted to kill her self.  En route with EMS patient was initially reported to have a carboxyhemoglobin level of 14 and there was concern for  left-sided weakness and facial droop.    ED Course: Upon arrival into the emergency department patient was noted lethargic.  She was initially on a nonrebreather oxygen.  A code stroke had been called for concern of left-sided weakness.  CT scan of the brain negative for any acute abnormalities.  Labs were significant for creatinine 1.27 carboxyhemoglobin 8.9, and methemoglobin 1.1.  Urine drug screen was positive for benzodiazepines which are on the patient's prescription list. Subsequently MRI was also noted to be negative for any sign of a stroke.  Patient had been IVC for concern for suicidal ideation.  Repeat carboxyhemoglobin levels be within normal limits and patient started to wake up.  Review of Systems  Constitutional: Negative for fever and weight loss.  HENT: Negative for congestion and nosebleeds.   Eyes: Negative for photophobia and pain.  Respiratory: Negative for cough and shortness of breath.   Cardiovascular: Negative for chest pain and leg swelling.  Gastrointestinal: Negative for abdominal pain, nausea and vomiting.  Genitourinary: Negative for dysuria and frequency.  Musculoskeletal: Negative for falls.  Neurological: Negative for focal weakness.  Psychiatric/Behavioral: Negative for suicidal ideas. The patient has insomnia.     Past Medical History:  Diagnosis Date  . CKD (chronic kidney disease)   . Depression   . Facial droop   . Fibromyalgia   . Hyperlipidemia   .  Hypertension   . Hypothyroid   . IBS (irritable bowel syndrome)   . Insomnia   . Lupus ([place])   . Migraine   . RLS (restless legs syndrome)   . Skin cancer Had cancer removed in 2000  . Thyroid disease     Past Surgical History:  Procedure Laterality Date  . ABDOMINAL HYSTERECTOMY  2008  . APPENDECTOMY  1998  . thoracic tumor  03/1995   excision     reports that she quit smoking about 13 years ago. Her smoking use included cigarettes. She has a 60.00 pack-year smoking history. She has never  used smokeless tobacco. She reports previous drug use. Drugs: Marijuana and Other-see comments. She reports that she does not drink alcohol.  Allergies  Allergen Reactions  . Erythromycin Anaphylaxis    Chest pain  . Morphine And Related Nausea And Vomiting    Not given  . Promethazine Nausea And Vomiting    "made her sick"  . Simvastatin Other (See Comments)    Muscle aches    Family History  Problem Relation Age of Onset  . Sleep apnea Mother   . Irregular heart beat Mother        A fib  . Hypertension Mother   . Diabetes Mother   . Dementia Mother   . Pneumonia Father   . Sleep apnea Brother   . Dementia Maternal Grandmother     Prior to Admission medications   Medication Sig Start Date End Date Taking? Authorizing Provider  acetaminophen (TYLENOL) 325 MG tablet Take 325 mg by mouth 2 (two) times daily as needed for mild pain.   Yes [provider]  azelastine (ASTELIN) 0.1 % nasal spray Place 2 sprays into both nostrils 2 (two) times daily. 08/17/19  Yes [provider]  docusate sodium (COLACE) 100 MG capsule Take 1 capsule (100 mg total) by mouth daily. 08/12/19 08/11/20 Yes Lavina Hamman, MD  doxepin (SINEQUAN) 25 MG capsule Take 25-50 mg by mouth at bedtime.    Yes [provider]  gabapentin (NEURONTIN) 100 MG capsule Take 1 capsule (100 mg total) by mouth 2 (two) times daily for 14 days. 08/14/19 08/28/19 Yes Lavina Hamman, MD  levothyroxine (SYNTHROID) 100 MCG tablet Take 100 mcg by mouth daily. 09/26/18  Yes [provider]  LORazepam (ATIVAN) 1 MG tablet Take 1 mg by mouth 3 (three) times daily as needed for anxiety or sleep.  05/21/19  Yes [provider]  Multiple Vitamin (MULTIVITAMIN) capsule Take 1 capsule by mouth daily.   Yes [provider]  omeprazole (PRILOSEC) 20 MG capsule Take 20 mg by mouth daily as needed (for acid reflux).    Yes [provider]  polyethylene glycol (MIRALAX) 17 g packet Take  17 g by mouth daily as needed for mild constipation. 08/12/19  Yes Lavina Hamman, MD  pravastatin (PRAVACHOL) 10 MG tablet Take 10 mg by mouth daily.   Yes [provider]  Probiotic CAPS Take 1 capsule by mouth daily. 06/22/19  Yes Ward, Delice Bison, DO  tiZANidine (ZANAFLEX) 4 MG tablet Take 4 mg by mouth at bedtime as needed for muscle spasms.  09/15/18  Yes [provider]  zolpidem (AMBIEN CR) 12.5 MG CR tablet Take 12.5 mg by mouth at bedtime as needed for sleep. 03/11/19  Yes [provider]    Physical Exam:  Constitutional: Older female NAD, calm, comfortable Vitals:   08/26/19 0245 08/26/19 0446 08/26/19 0526 08/26/19 8338  BP: (!) 139/96 (!) 145/95 (!) 156/90 (!) 156/88  Pulse: 84 80 88 87  Resp: 20 19 (!) 23 18  Temp:      TempSrc:      SpO2: 100% 99% 100% 100%  Weight:      Height:       Eyes: PERRL, lids and conjunctivae normal ENMT: Mucous membranes are moist. Posterior pharynx clear of any exudate or lesions.  Neck: normal, supple, no masses, no thyromegaly Respiratory: clear to auscultation bilaterally, no wheezing, no crackles. Normal respiratory effort. No accessory muscle use.  Cardiovascular: Regular rate and rhythm, no murmurs / rubs / gallops. No extremity edema. 2+ pedal pulses. No carotid bruits.  Abdomen: no tenderness, no masses palpated. No hepatosplenomegaly. Bowel sounds positive.  Musculoskeletal: no clubbing / cyanosis. No joint deformity upper and lower extremities. Good ROM, no contractures. Normal muscle tone.  Skin: no rashes, lesions, ulcers. No induration Neurologic: CN 2-12 grossly intact. Sensation intact, DTR normal. Strength 5/5 in all 4.  Psychiatric: Normal judgment and insight. Alert and oriented x 3. Normal mood.     Labs on Admission: I have personally reviewed following labs and imaging studies  CBC: Recent Labs  Lab 08/26/19 0021 08/26/19 0032 08/26/19 0033  WBC 8.3  --   --   NEUTROABS 6.4  --   --     HGB 12.6 13.6 13.6  HCT 38.7 40.0 40.0  MCV 85.4  --   --   PLT 242  --   --    Basic Metabolic Panel: Recent Labs  Lab 08/26/19 0021 08/26/19 0032 08/26/19 0033  NA 142 141 142  K 4.8 4.8 4.8  CL 106  --  104  CO2 25  --   --   GLUCOSE 140*  --  136*  BUN 11  --  15  CREATININE 1.27*  --  1.20*  CALCIUM 9.3  --   --    GFR: Estimated Creatinine Clearance: 56.7 mL/min (A) (by C-G formula based on SCr of 1.2 mg/dL (H)). Liver Function Tests: Recent Labs  Lab 08/26/19 0021  AST 61*  ALT 29  ALKPHOS 84  BILITOT 0.9  PROT 6.6  ALBUMIN 3.3*   No results for input(s): LIPASE, AMYLASE in the last 168 hours. No results for input(s): AMMONIA in the last 168 hours. Coagulation Profile: Recent Labs  Lab 08/26/19 0021  INR 1.0   Cardiac Enzymes: No results for input(s): CKTOTAL, CKMB, CKMBINDEX, TROPONINI in the last 168 hours. BNP (last 3 results) No results for input(s): PROBNP in the last 8760 hours. HbA1C: No results for input(s): HGBA1C in the last 72 hours. CBG: Recent Labs  Lab 08/26/19 0012  GLUCAP 131*   Lipid Profile: No results for input(s): CHOL, HDL, LDLCALC, TRIG, CHOLHDL, LDLDIRECT in the last 72 hours. Thyroid Function Tests: No results for input(s): TSH, T4TOTAL, FREET4, T3FREE, THYROIDAB in the last 72 hours. Anemia Panel: No results for input(s): VITAMINB12, FOLATE, FERRITIN, TIBC, IRON, RETICCTPCT in the last 72 hours. Urine analysis:    Component Value Date/Time   COLORURINE YELLOW 08/10/2019 0820   APPEARANCEUR CLEAR 08/10/2019 0820   LABSPEC 1.011 08/10/2019 0820   PHURINE 6.0 08/10/2019 0820   GLUCOSEU NEGATIVE 08/10/2019 0820   HGBUR NEGATIVE 08/10/2019 0820   BILIRUBINUR NEGATIVE 08/10/2019 0820   KETONESUR NEGATIVE 08/10/2019 0820   PROTEINUR NEGATIVE 08/10/2019 0820   UROBILINOGEN 0.2 08/16/2014 1820   NITRITE NEGATIVE 08/10/2019 0820   LEUKOCYTESUR NEGATIVE 08/10/2019 0820   Sepsis  Labs: Recent Results (from the past 240  hour(s))  SARS Coronavirus 2 by RT PCR (hospital order, performed in [facility] hospital lab) Nasopharyngeal Nasopharyngeal Swab     Status: None   Collection Time: 08/26/19  2:15 AM   Specimen: Nasopharyngeal Swab  Result Value Ref Range Status   SARS Coronavirus 2 NEGATIVE NEGATIVE Final    Comment: (NOTE) SARS-CoV-2 target nucleic acids are NOT DETECTED.  The SARS-CoV-2 RNA is generally detectable in upper and lower respiratory specimens during the acute phase of infection. The lowest concentration of SARS-CoV-2 viral copies this assay can detect is 250 copies / mL. A negative result does not preclude SARS-CoV-2 infection and should not be used as the sole basis for treatment or other patient management decisions.  A negative result may occur with improper specimen collection / handling, submission of specimen other than nasopharyngeal swab, presence of viral mutation(s) within the areas targeted by this assay, and inadequate number of viral copies (<250 copies / mL). A negative result must be combined with clinical observations, patient history, and epidemiological information.  Fact Sheet for Patients:   StrictlyIdeas.no  Fact Sheet for Healthcare Providers: BankingDealers.co.za  This test is not yet approved or  cleared by the [place] FDA and has been authorized for detection and/or diagnosis of SARS-CoV-2 by FDA under an Emergency Use Authorization (EUA).  This EUA will remain in effect (meaning this test can be used) for the duration of the COVID-19 declaration under Section 564(b)(1) of the Act, 21 U.S.C. section 360bbb-3(b)(1), unless the authorization is terminated or revoked sooner.  Performed at [facility], [place] [address], [place], [place] [address]      Radiological Exams on Admission: CT Code Stroke CTA Head W/WO contrast  Result Date: 08/26/2019 CLINICAL DATA:  Initial evaluation for acute left  facial droop, left-sided weakness. EXAM: CT ANGIOGRAPHY HEAD AND NECK TECHNIQUE: Multidetector CT imaging of the head and neck was performed using the standard protocol during bolus administration of intravenous contrast. Multiplanar CT image reconstructions and MIPs were obtained to evaluate the vascular anatomy. Carotid stenosis measurements (when applicable) are obtained utilizing NASCET criteria, using the distal internal carotid diameter as the denominator. CONTRAST:  57mL OMNIPAQUE IOHEXOL 350 MG/ML SOLN COMPARISON:  Prior head CT from earlier the same day. FINDINGS: CTA NECK FINDINGS Aortic arch: Partially visualized aortic arch of normal caliber with normal branch pattern. No flow-limiting stenosis seen about the origin of the great vessels. Visualized subclavian arteries widely patent. Right carotid system: Right common carotid artery widely patent from its origin to the bifurcation without stenosis. Mild eccentric plaque about the right bifurcation without significant stenosis. Right ICA widely patent distally to the skull base without stenosis, dissection or occlusion. Left carotid system: Left CCA widely patent from its origin to the bifurcation without stenosis. Mild atheromatous change about the left bifurcation without significant stenosis. Left ICA widely patent distally to the skull base without stenosis, dissection or occlusion. Vertebral arteries: Both vertebral arteries arise from the subclavian arteries. Vertebral arteries widely patent within the neck without stenosis, dissection or occlusion. Skeleton: No visible acute osseous abnormality, although evaluation somewhat limited by lack of sagittal MPR sequence. No discrete or worrisome osseous lesions. Other neck: No other acute soft tissue abnormality within the neck. No mass lesion or adenopathy. Upper chest: Visualized upper chest demonstrates no acute finding. Subcentimeter calcified granuloma noted at the left upper lobe. Review of the MIP  images confirms the above findings CTA HEAD FINDINGS Anterior  circulation: Petrous, cavernous, and supraclinoid ICAs widely patent without stenosis or other abnormality. A1 segments patent bilaterally. Normal anterior communicating artery complex. Anterior cerebral arteries patent to their distal aspects without stenosis. No M1 stenosis or occlusion. Negative MCA bifurcations. Distal MCA branches well perfused and symmetric. Posterior circulation: Vertebral arteries patent to the vertebrobasilar junction without stenosis. Both picas patent. Basilar patent to its distal aspect without stenosis. Superior cerebral arteries patent bilaterally. Both PCAs primarily supplied via the basilar well perfused to their distal aspects. Small right posterior communicating artery noted. Venous sinuses: Grossly patent allowing for timing the contrast bolus. Anatomic variants: None significant.  No visible aneurysm. Review of the MIP images confirms the above findings IMPRESSION: 1. Negative CTA for emergent large vessel occlusion. 2. Mild atheromatous change about the carotid bifurcations without significant stenosis. 3. No other hemodynamically significant or correctable stenosis identified about the major arterial vasculature of the head and neck. Electronically Signed   By: Jeannine Boga M.D.   On: 08/26/2019 02:14   CT Code Stroke CTA Neck W/WO contrast  Result Date: 08/26/2019 CLINICAL DATA:  Initial evaluation for acute left facial droop, left-sided weakness. EXAM: CT ANGIOGRAPHY HEAD AND NECK TECHNIQUE: Multidetector CT imaging of the head and neck was performed using the standard protocol during bolus administration of intravenous contrast. Multiplanar CT image reconstructions and MIPs were obtained to evaluate the vascular anatomy. Carotid stenosis measurements (when applicable) are obtained utilizing NASCET criteria, using the distal internal carotid diameter as the denominator. CONTRAST:  4mL OMNIPAQUE IOHEXOL  350 MG/ML SOLN COMPARISON:  Prior head CT from earlier the same day. FINDINGS: CTA NECK FINDINGS Aortic arch: Partially visualized aortic arch of normal caliber with normal branch pattern. No flow-limiting stenosis seen about the origin of the great vessels. Visualized subclavian arteries widely patent. Right carotid system: Right common carotid artery widely patent from its origin to the bifurcation without stenosis. Mild eccentric plaque about the right bifurcation without significant stenosis. Right ICA widely patent distally to the skull base without stenosis, dissection or occlusion. Left carotid system: Left CCA widely patent from its origin to the bifurcation without stenosis. Mild atheromatous change about the left bifurcation without significant stenosis. Left ICA widely patent distally to the skull base without stenosis, dissection or occlusion. Vertebral arteries: Both vertebral arteries arise from the subclavian arteries. Vertebral arteries widely patent within the neck without stenosis, dissection or occlusion. Skeleton: No visible acute osseous abnormality, although evaluation somewhat limited by lack of sagittal MPR sequence. No discrete or worrisome osseous lesions. Other neck: No other acute soft tissue abnormality within the neck. No mass lesion or adenopathy. Upper chest: Visualized upper chest demonstrates no acute finding. Subcentimeter calcified granuloma noted at the left upper lobe. Review of the MIP images confirms the above findings CTA HEAD FINDINGS Anterior circulation: Petrous, cavernous, and supraclinoid ICAs widely patent without stenosis or other abnormality. A1 segments patent bilaterally. Normal anterior communicating artery complex. Anterior cerebral arteries patent to their distal aspects without stenosis. No M1 stenosis or occlusion. Negative MCA bifurcations. Distal MCA branches well perfused and symmetric. Posterior circulation: Vertebral arteries patent to the vertebrobasilar  junction without stenosis. Both picas patent. Basilar patent to its distal aspect without stenosis. Superior cerebral arteries patent bilaterally. Both PCAs primarily supplied via the basilar well perfused to their distal aspects. Small right posterior communicating artery noted. Venous sinuses: Grossly patent allowing for timing the contrast bolus. Anatomic variants: None significant.  No visible aneurysm. Review of the MIP images confirms the above  findings IMPRESSION: 1. Negative CTA for emergent large vessel occlusion. 2. Mild atheromatous change about the carotid bifurcations without significant stenosis. 3. No other hemodynamically significant or correctable stenosis identified about the major arterial vasculature of the head and neck. Electronically Signed   By: Jeannine Boga M.D.   On: 08/26/2019 02:14   MR BRAIN WO CONTRAST  Result Date: 08/26/2019 CLINICAL DATA:  Left-sided weakness and left facial droop. EXAM: MRI HEAD WITHOUT CONTRAST TECHNIQUE: Multiplanar, multiecho pulse sequences of the brain and surrounding structures were obtained without intravenous contrast. COMPARISON:  CT and CTA from earlier today FINDINGS: Brain: No acute infarction, hemorrhage, hydrocephalus, extra-axial collection or mass lesion. Normal brain volume. Mild chronic small vessel ischemic type change in the cerebral white matter and pons-patient has multiple vascular risk factors. The bilateral globus pallidus has a normal appearance. Vascular: Normal flow voids Skull and upper cervical spine: Negative Sinuses/Orbits: Partial right mastoid opacification. IMPRESSION: 1. No acute finding.  No MR findings of carbon monoxide poisoning. 2. Mild chronic small vessel ischemia. Electronically Signed   By: Monte Fantasia M.D.   On: 08/26/2019 07:43   DG Chest Port 1 View  Result Date: 08/26/2019 CLINICAL DATA:  59 year old female with code stroke. EXAM: PORTABLE CHEST 1 VIEW COMPARISON:  Chest radiograph dated  03/31/2019. FINDINGS: Minimal left lung base atelectasis. No focal consolidation, pleural effusion, or pneumothorax. The cardiac silhouette is within limits. No acute osseous pathology. IMPRESSION: No active disease. Electronically Signed   By: Anner Crete M.D.   On: 08/26/2019 03:25   CT HEAD CODE STROKE WO CONTRAST  Result Date: 08/26/2019 CLINICAL DATA:  Code stroke. Initial evaluation for possible stroke, left-sided weakness with left facial droop. EXAM: CT HEAD WITHOUT CONTRAST TECHNIQUE: Contiguous axial images were obtained from the base of the skull through the vertex without intravenous contrast. COMPARISON:  None available. FINDINGS: Brain: Cerebral volume within normal limits for age. No acute intracranial hemorrhage. No acute large vessel territory infarct. Subcentimeter lucency at the inferior aspect of the left lentiform nucleus favored to reflect a small dilated perivascular space. No mass lesion, midline shift or mass effect. No hydrocephalus or extra-axial fluid collection. At least partially empty sella noted. Vascular: No hyperdense vessel. Skull: Scalp soft tissues and calvarium within normal limits. Sinuses/Orbits: Globes and orbital soft tissues demonstrate no acute finding. Paranasal sinuses are clear. Trace chronic right mastoid effusion noted, of doubtful significance. Other: None. ASPECTS [facility] Stroke Program Early CT Score) - Ganglionic level infarction (caudate, lentiform nuclei, internal capsule, insula, M1-M3 cortex): 7 - Supraganglionic infarction (M4-M6 cortex): 3 Total score (0-10 with 10 being normal): 10 IMPRESSION: 1. Negative head CT.  No acute intracranial abnormality identified. 2. ASPECTS is 10. Critical Value/emergent results were called by telephone at the time of interpretation on 08/26/2019 at 1:02 am to provider Dr. Cheral Marker, who verbally acknowledged these results. Electronically Signed   By: Jeannine Boga M.D.   On: 08/26/2019 01:10    EKG:  Independently reviewed.  Sinus rhythm at 91 bpm with left axis deviation.  Assessment/Plan Carbon-monoxide poisoning  Involuntarily committed: Acute.  Patient reports that she was not trying to hurt herself, but has had a hard time getting more started yesterday when she went upstairs to change her shoes and somehow fell asleep.  Adamantly denies any suicidal ideation.  She had been formally involuntarily committed.  Suspect this could have been an accidental event at this time. -Admit to a medical telemetry bed -Suicide precautions with sitter -TTS consult formally  placed -Consider discontinue sitter and discharge home likely in am  Left-sided weakness: Resolved. Initially then noted to have left-sided facial droop and weakness with dysarthria.  Suspect symptoms secondary to above as CT and MRI of the brain negative for any acute abnormalities.  Echocardiograms revealed EF of 50-60%. Neurology has reevaluated and recommended no further work-up.  Anxiety: Home medications include Ativan 1 mg 3 times daily as needed for anxiety -Continue Ativan  Hypothyroidism: Home medication includes levothyroxine 100 mcg daily. -Check TSH -Continue levothyroxine  Hyperlipidemia  -Follow-up lipid -Continue pravastatin  DVT prophylaxis: Lovenox  Code Status: Full Family Communication: Unable to reach son by phone. Disposition Plan: Likely discharge home once medically stable Consults called: Neurology Admission status: Observation Norval Morton MD [facility] Pager [phone]   If 7PM-7AM, please contact night-coverage [email or web address] Password [facility]  08/26/2019, 10:04 AM

## 2019-08-26 NOTE — ED Provider Notes (Signed)
Care transferred from [place], PA-C at shift change. See note for full HPI.  Possible Suicide attempt, found in garage with running [employer]  Per family, difficult family situation at home, Volatile, fitting with ex husband for custody of dog.  O2, waking up. Initial Carboxy on scene was 14, significant ly improved since arrival. Tobacco user.  [place] with Neuro saw her, CO2 intoxication vs CVA  Plan MRI, if no findings on MRI, try off O2, Psych consult. If trys to leave will need IVC.  Physical Exam  BP (!) 156/88    Pulse 87    Temp 98.1 F (36.7 C) (Oral)    Resp 18    Ht 5\' 9"  (1.753 m)    Wt 83.2 kg    SpO2 100%    BMI 27.09 kg/m   Physical Exam Vitals and nursing note reviewed.  Constitutional:      General: She is not in acute distress.    Appearance: She is well-developed.  HENT:     Head: Atraumatic.     Comments: Mild left facial droop  Eyes:     Pupils: Pupils are equal, round, and reactive to light.  Cardiovascular:     Rate and Rhythm: Normal rate.  Pulmonary:     Effort: No respiratory distress.  Abdominal:     General: There is no distension.  Musculoskeletal:        General: Normal range of motion.     Cervical back: Normal range of motion.  Skin:    General: Skin is warm and dry.  Neurological:     Mental Status: She is alert.     Comments: Ambulatory in room with minimal ataxic gait.   Psychiatric:     Comments: Denies SI, HI, AVH    ED Course/Procedures     Procedures Labs Reviewed  COMPREHENSIVE METABOLIC PANEL - Abnormal; Notable for the following components:      Result Value   Glucose, Bld 140 (*)    Creatinine, Ser 1.27 (*)    Albumin 3.3 (*)    AST 61 (*)    GFR calc non Af Amer 46 (*)    GFR calc Af Amer 53 (*)    All other components within normal limits  COOXEMETRY PANEL - Abnormal; Notable for the following components:   Carboxyhemoglobin 8.9 (*)    All other components within normal limits  RAPID URINE DRUG SCREEN, HOSP  PERFORMED - Abnormal; Notable for the following components:   Benzodiazepines POSITIVE (*)    All other components within normal limits  ACETAMINOPHEN LEVEL - Abnormal; Notable for the following components:   Acetaminophen (Tylenol), Serum <10 (*)    All other components within normal limits  SALICYLATE LEVEL - Abnormal; Notable for the following components:   Salicylate Lvl <3.8 (*)    All other components within normal limits  I-STAT CHEM 8, ED - Abnormal; Notable for the following components:   Creatinine, Ser 1.20 (*)    Glucose, Bld 136 (*)    All other components within normal limits  CBG MONITORING, ED - Abnormal; Notable for the following components:   Glucose-Capillary 131 (*)    All other components within normal limits  I-STAT VENOUS BLOOD GAS, ED - Abnormal; Notable for the following components:   Bicarbonate 29.8 (*)    Acid-Base Excess 4.0 (*)    All other components within normal limits  SARS CORONAVIRUS 2 BY RT PCR (HOSPITAL ORDER, [facility])  PROTIME-INR  APTT  CBC  DIFFERENTIAL  ETHANOL  COOXEMETRY PANEL  I-STAT ARTERIAL BLOOD GAS, ED  CT Code Stroke CTA Head W/WO contrast  Result Date: 08/26/2019 CLINICAL DATA:  Initial evaluation for acute left facial droop, left-sided weakness. EXAM: CT ANGIOGRAPHY HEAD AND NECK TECHNIQUE: Multidetector CT imaging of the head and neck was performed using the standard protocol during bolus administration of intravenous contrast. Multiplanar CT image reconstructions and MIPs were obtained to evaluate the vascular anatomy. Carotid stenosis measurements (when applicable) are obtained utilizing NASCET criteria, using the distal internal carotid diameter as the denominator. CONTRAST:  40mL OMNIPAQUE IOHEXOL 350 MG/ML SOLN COMPARISON:  Prior head CT from earlier the same day. FINDINGS: CTA NECK FINDINGS Aortic arch: Partially visualized aortic arch of normal caliber with normal branch pattern. No flow-limiting  stenosis seen about the origin of the great vessels. Visualized subclavian arteries widely patent. Right carotid system: Right common carotid artery widely patent from its origin to the bifurcation without stenosis. Mild eccentric plaque about the right bifurcation without significant stenosis. Right ICA widely patent distally to the skull base without stenosis, dissection or occlusion. Left carotid system: Left CCA widely patent from its origin to the bifurcation without stenosis. Mild atheromatous change about the left bifurcation without significant stenosis. Left ICA widely patent distally to the skull base without stenosis, dissection or occlusion. Vertebral arteries: Both vertebral arteries arise from the subclavian arteries. Vertebral arteries widely patent within the neck without stenosis, dissection or occlusion. Skeleton: No visible acute osseous abnormality, although evaluation somewhat limited by lack of sagittal MPR sequence. No discrete or worrisome osseous lesions. Other neck: No other acute soft tissue abnormality within the neck. No mass lesion or adenopathy. Upper chest: Visualized upper chest demonstrates no acute finding. Subcentimeter calcified granuloma noted at the left upper lobe. Review of the MIP images confirms the above findings CTA HEAD FINDINGS Anterior circulation: Petrous, cavernous, and supraclinoid ICAs widely patent without stenosis or other abnormality. A1 segments patent bilaterally. Normal anterior communicating artery complex. Anterior cerebral arteries patent to their distal aspects without stenosis. No M1 stenosis or occlusion. Negative MCA bifurcations. Distal MCA branches well perfused and symmetric. Posterior circulation: Vertebral arteries patent to the vertebrobasilar junction without stenosis. Both picas patent. Basilar patent to its distal aspect without stenosis. Superior cerebral arteries patent bilaterally. Both PCAs primarily supplied via the basilar well perfused  to their distal aspects. Small right posterior communicating artery noted. Venous sinuses: Grossly patent allowing for timing the contrast bolus. Anatomic variants: None significant.  No visible aneurysm. Review of the MIP images confirms the above findings IMPRESSION: 1. Negative CTA for emergent large vessel occlusion. 2. Mild atheromatous change about the carotid bifurcations without significant stenosis. 3. No other hemodynamically significant or correctable stenosis identified about the major arterial vasculature of the head and neck. Electronically Signed   By: Jeannine Boga M.D.   On: 08/26/2019 02:14   CT Code Stroke CTA Neck W/WO contrast  Result Date: 08/26/2019 CLINICAL DATA:  Initial evaluation for acute left facial droop, left-sided weakness. EXAM: CT ANGIOGRAPHY HEAD AND NECK TECHNIQUE: Multidetector CT imaging of the head and neck was performed using the standard protocol during bolus administration of intravenous contrast. Multiplanar CT image reconstructions and MIPs were obtained to evaluate the vascular anatomy. Carotid stenosis measurements (when applicable) are obtained utilizing NASCET criteria, using the distal internal carotid diameter as the denominator. CONTRAST:  67mL OMNIPAQUE IOHEXOL 350 MG/ML SOLN COMPARISON:  Prior head CT from earlier the same day. FINDINGS: CTA  NECK FINDINGS Aortic arch: Partially visualized aortic arch of normal caliber with normal branch pattern. No flow-limiting stenosis seen about the origin of the great vessels. Visualized subclavian arteries widely patent. Right carotid system: Right common carotid artery widely patent from its origin to the bifurcation without stenosis. Mild eccentric plaque about the right bifurcation without significant stenosis. Right ICA widely patent distally to the skull base without stenosis, dissection or occlusion. Left carotid system: Left CCA widely patent from its origin to the bifurcation without stenosis. Mild  atheromatous change about the left bifurcation without significant stenosis. Left ICA widely patent distally to the skull base without stenosis, dissection or occlusion. Vertebral arteries: Both vertebral arteries arise from the subclavian arteries. Vertebral arteries widely patent within the neck without stenosis, dissection or occlusion. Skeleton: No visible acute osseous abnormality, although evaluation somewhat limited by lack of sagittal MPR sequence. No discrete or worrisome osseous lesions. Other neck: No other acute soft tissue abnormality within the neck. No mass lesion or adenopathy. Upper chest: Visualized upper chest demonstrates no acute finding. Subcentimeter calcified granuloma noted at the left upper lobe. Review of the MIP images confirms the above findings CTA HEAD FINDINGS Anterior circulation: Petrous, cavernous, and supraclinoid ICAs widely patent without stenosis or other abnormality. A1 segments patent bilaterally. Normal anterior communicating artery complex. Anterior cerebral arteries patent to their distal aspects without stenosis. No M1 stenosis or occlusion. Negative MCA bifurcations. Distal MCA branches well perfused and symmetric. Posterior circulation: Vertebral arteries patent to the vertebrobasilar junction without stenosis. Both picas patent. Basilar patent to its distal aspect without stenosis. Superior cerebral arteries patent bilaterally. Both PCAs primarily supplied via the basilar well perfused to their distal aspects. Small right posterior communicating artery noted. Venous sinuses: Grossly patent allowing for timing the contrast bolus. Anatomic variants: None significant.  No visible aneurysm. Review of the MIP images confirms the above findings IMPRESSION: 1. Negative CTA for emergent large vessel occlusion. 2. Mild atheromatous change about the carotid bifurcations without significant stenosis. 3. No other hemodynamically significant or correctable stenosis identified about  the major arterial vasculature of the head and neck. Electronically Signed   By: Jeannine Boga M.D.   On: 08/26/2019 02:14   MR BRAIN WO CONTRAST  Result Date: 08/26/2019 CLINICAL DATA:  Left-sided weakness and left facial droop. EXAM: MRI HEAD WITHOUT CONTRAST TECHNIQUE: Multiplanar, multiecho pulse sequences of the brain and surrounding structures were obtained without intravenous contrast. COMPARISON:  CT and CTA from earlier today FINDINGS: Brain: No acute infarction, hemorrhage, hydrocephalus, extra-axial collection or mass lesion. Normal brain volume. Mild chronic small vessel ischemic type change in the cerebral white matter and pons-patient has multiple vascular risk factors. The bilateral globus pallidus has a normal appearance. Vascular: Normal flow voids Skull and upper cervical spine: Negative Sinuses/Orbits: Partial right mastoid opacification. IMPRESSION: 1. No acute finding.  No MR findings of carbon monoxide poisoning. 2. Mild chronic small vessel ischemia. Electronically Signed   By: Monte Fantasia M.D.   On: 08/26/2019 07:43   CT ABDOMEN PELVIS W CONTRAST  Result Date: 08/10/2019 CLINICAL DATA:  Abdominal pain and vomiting. EXAM: CT ABDOMEN AND PELVIS WITH CONTRAST TECHNIQUE: Multidetector CT imaging of the abdomen and pelvis was performed using the standard protocol following bolus administration of intravenous contrast. CONTRAST:  144mL OMNIPAQUE IOHEXOL 300 MG/ML  SOLN COMPARISON:  CT scan 06/22/2019 FINDINGS: Lower chest: The lung bases are clear of acute process. No pleural effusion or pulmonary lesions. The heart is normal in size. No pericardial  effusion. The distal esophagus and aorta are unremarkable. Hepatobiliary: No focal hepatic lesions or intrahepatic biliary dilatation. The gallbladder appears normal. No common bile duct dilatation. The portal and hepatic veins are patent. Pancreas: No mass, inflammation or ductal dilatation. Small duodenal diverticulum noted near the  pancreatic head. Spleen: Small calcified granulomas but no mass. Normal size. Adrenals/Urinary Tract: The adrenal glands and kidneys are unremarkable. The bladder is unremarkable. Stomach/Bowel: The stomach, duodenum and small bowel are unremarkable. No acute inflammatory changes, mass lesions or obstructive findings. The terminal ileum is normal. The appendix is surgically absent. Changes of acute uncomplicated diverticulitis involving the mid sigmoid colon. Severe sigmoid colon diverticulosis. No abscess or free air. Stable significant diverticulosis involving the descending colon also. Vascular/Lymphatic: Age advanced atherosclerotic calcifications involving the aorta and iliac arteries but no aneurysm or dissection. The branch vessels are patent. The major venous structures are patent. No mesenteric or retroperitoneal mass or adenopathy. Reproductive: Surgically absent. Other: No pelvic mass or adenopathy. No free pelvic fluid collections. No inguinal mass or adenopathy. No abdominal wall hernia or subcutaneous lesions. Musculoskeletal: No significant bony findings. IMPRESSION: 1. Acute uncomplicated diverticulitis involving the mid sigmoid colon. 2. No other significant abdominal/pelvic findings, mass lesions or adenopathy. 3. Age advanced atherosclerotic calcifications involving the aorta and iliac arteries. Aortic Atherosclerosis (ICD10-I70.0). Electronically Signed   By: Marijo Sanes M.D.   On: 08/10/2019 13:01   DG Chest Port 1 View  Result Date: 08/26/2019 CLINICAL DATA:  59 year old female with code stroke. EXAM: PORTABLE CHEST 1 VIEW COMPARISON:  Chest radiograph dated 03/31/2019. FINDINGS: Minimal left lung base atelectasis. No focal consolidation, pleural effusion, or pneumothorax. The cardiac silhouette is within limits. No acute osseous pathology. IMPRESSION: No active disease. Electronically Signed   By: Anner Crete M.D.   On: 08/26/2019 03:25   NCV with EMG(electromyography)  Result  Date: 07/30/2019 [name], MD     08/06/2019  3:44 PM  [facility] NCS (NERVE CONDUCTION STUDY) WITH EMG (ELECTROMYOGRAPHY) REPORT STUDY DATE: 07/30/19 PATIENT NAME: EDOM SCHMUHL DOB: May 02, 1960 MRN: [id number] ORDERING CLINICIAN: Andrey Spearman, MD TECHNOLOGIST: Sherre Scarlet ELECTROMYOGRAPHER: Earlean Polka. Penumalli, MD CLINICAL INFORMATION: 59 year old female with ptosis and weakness.  Evaluate for myasthenia gravis. FINDINGS: NERVE CONDUCTION STUDY: Right median, right ulnar, right peroneal and right tibial motor responses normal. Right sural, right superficial peroneal, right median and right ulnar sensory responses are normal. Right tibial and right ulnar F-wave latencies are normal. Repetitive nerve stimulation of right spinal accessory nerve and recording of right trapezius muscle was performed at baseline, immediately after exertion and at 1 minute increments up to 5 minutes after exertion.  No significant decremental response was noted at baseline or following exertion. NEEDLE ELECTROMYOGRAPHY: Needle examination of right upper extremity is normal. IMPRESSION: This is a normal study.  No electrodiagnostic evidence of large fiber neuropathy, myopathy or neuromuscular junction disorder at this time. INTERPRETING PHYSICIAN: [name], MD Certified in Neurology, Neurophysiology and Neuroimaging [facility] [address], [place], [place] [address] [phone] [facility]   Nerve / Sites Muscle Latency Ref. Amplitude Ref. Rel Amp Segments Distance Velocity Ref. Area   ms ms mV mV %  cm m/s m/s mVms R Median - APB    Wrist APB 3.0 ?4.4 8.0 ?4.0 100 Wrist - APB 7   26.8    Upper arm APB 7.1  7.5  93.4 Upper arm - Wrist 23 55 ?49 24.5 R Ulnar - ADM    Wrist  ADM 2.7 ?3.3 9.2 ?6.0 100 Wrist - ADM 7   24.4    B.Elbow ADM 6.0  8.5  92.2 B.Elbow - Wrist 21 63 ?49 24.1    A.Elbow ADM 7.9  8.5  101 A.Elbow - B.Elbow 10 53 ?49 24.2        A.Elbow - Wrist     R Peroneal -  EDB    Ankle EDB 3.5 ?6.5 4.8 ?2.0 100 Ankle - EDB 9   19.8    Fib head EDB 10.1  4.1  85.8 Fib head - Ankle 32 48 ?44 20.6    Pop fossa EDB 12.2  4.4  106 Pop fossa - Fib head 10 49 ?44 21.3        Pop fossa - Ankle     R Tibial - AH    Ankle AH 3.6 ?5.8 8.7 ?4.0 100 Ankle - AH 9   13.3    Pop fossa AH 13.0  4.9  56.4 Pop fossa - Ankle 41 44 ?41 13.4           SNC   Nerve / Sites Rec. Site Peak Lat Ref.  Amp Ref. Segments Distance   ms ms V V  cm R Sural - Ankle (Calf)    Calf Ankle 3.7 ?4.4 15 ?6 Calf - Ankle 14 R Superficial peroneal - Ankle    Lat leg Ankle 2.5 ?4.4 31 ?6 Lat leg - Ankle 14 R Median - Orthodromic (Dig II, Mid palm)    Dig II Wrist 2.7 ?3.4 20 ?10 Dig II - Wrist 13 R Ulnar - Orthodromic, (Dig V, Mid palm)    Dig V Wrist 2.5 ?3.1 11 ?5 Dig V - Wrist 40           F  Wave   Nerve F Lat Ref.  ms ms R Tibial - AH 52.7 ?56.0 R Ulnar - ADM 28.4 ?32.0       Rep Stim   Anatomy / Train Rate Ampl. Ampl 4-1 Fac Ampl Area Area 4-1 Fac Area Time  Hz mV % % mVms % %  R Trapezius (upper) - (Accessory spinal) Baseline @1Hz  1 5.3 0.3 100 51.7 2.6 100 0:00:00 Baseline @3Hz  3 5.5 4.7 103 52.5 20 101 0:00:26 Post Exercise @0 :00 3 4.8 -5 90.1 43.6 6.1 84.2 0:01:52 @ 0:30 3 5.7 5.1 106 52.8 17.1 102 0:02:42 @ 1:00 3 5.8 11 109 54.4 18.3 105 0:03:13 @ 2:00 3 5.7 4.2 107 54.5 20.5 105 0:04:14 @ 3:00 3 5.8 5.7 109 54.6 20 106 0:05:15 @ 4:00 3 5.8 9.1 109 54.1 16.9 105 0:06:16 @ 5:00 3 5.8 6.3 108 54.4 19.8 105 0:07:17     EMG Summary Table   Spontaneous MUAP Recruitment Muscle IA Fib PSW Fasc Other Amp Dur. Poly Pattern R. Deltoid Normal None None None _______ Normal Normal Normal Normal R. Biceps brachii Normal None None None _______ Normal Normal Normal Normal R. Triceps brachii Normal None None None _______ Normal Normal Normal Normal R. Flexor carpi radialis Normal None None None _______ Normal Normal Normal Normal R. First dorsal interosseous Normal None None None _______ Normal Normal Normal Normal    CT HEAD  CODE STROKE WO CONTRAST  Result Date: 08/26/2019 CLINICAL DATA:  Code stroke. Initial evaluation for possible stroke, left-sided weakness with left facial droop. EXAM: CT HEAD WITHOUT CONTRAST TECHNIQUE: Contiguous axial images were obtained from the base of the skull through the vertex without intravenous contrast. COMPARISON:  None available.  FINDINGS: Brain: Cerebral volume within normal limits for age. No acute intracranial hemorrhage. No acute large vessel territory infarct. Subcentimeter lucency at the inferior aspect of the left lentiform nucleus favored to reflect a small dilated perivascular space. No mass lesion, midline shift or mass effect. No hydrocephalus or extra-axial fluid collection. At least partially empty sella noted. Vascular: No hyperdense vessel. Skull: Scalp soft tissues and calvarium within normal limits. Sinuses/Orbits: Globes and orbital soft tissues demonstrate no acute finding. Paranasal sinuses are clear. Trace chronic right mastoid effusion noted, of doubtful significance. Other: None. ASPECTS [facility] Stroke Program Early CT Score) - Ganglionic level infarction (caudate, lentiform nuclei, internal capsule, insula, M1-M3 cortex): 7 - Supraganglionic infarction (M4-M6 cortex): 3 Total score (0-10 with 10 being normal): 10 IMPRESSION: 1. Negative head CT.  No acute intracranial abnormality identified. 2. ASPECTS is 10. Critical Value/emergent results were called by telephone at the time of interpretation on 08/26/2019 at 1:02 am to provider Dr. Cheral Marker, who verbally acknowledged these results. Electronically Signed   By: Jeannine Boga M.D.   On: 08/26/2019 01:10   MDM  Possible SA vs CVA. Neuro evaluated. Plan MRI. If negative attempt off O2 here in ED.   Plan if medically cleared to Psych consult given possible Suicide attempt.  Patient reassessed.  She is agitated and requesting discharge.  Discussed with patient pending labs.  Discussed once she is medically cleared  she will need to talk with psychiatry given possible suicide attempt.  CONSULT with Dr. Rory Percy with neurology.  Per Dr. Yvetta Coder note patient to be admitted for TIA work-up.  She is under IVC given suicide attempt. Patient WO Hypoxia on RA. Carboxyhemoglobin on repeat WNL. Salicylate, Acetaminophen, Ethanol WNL.  Will need psych consult once she is medically cleared   CONSULT with Dr. Tamala Julian with [facility] who will evaluate patient for admission.  The patient appears reasonably stabilized for admission considering the current resources, flow, and capabilities available in the ED at this time, and I doubt any other [facility] requiring further screening and/or treatment in the ED prior to admission.  Patient evaluated by previous attending Dr. Leonides Schanz.         Balbina Depace A, PA-C 08/26/19 1013    Ward, [employer], DO 08/26/19 [id number]

## 2019-08-26 NOTE — Progress Notes (Signed)
STROKE TEAM PROGRESS NOTE   INTERVAL HISTORY Friend at bedside. Pt sitting in bed, stated that she feels good and she had no stroke.  While trying to figure out the whole story, she stated that yesterday she took her lawnmower out, started and keep with running, and she decided to change her shoes so she got inside the house and somehow went upstairs but fell asleep.  She woke up with CO2 alarm activated and she did not remember she had lawn mower running outside. She was found to be lethargic and has left facial droop and left arm weakness for which she was sent to ER for code stroke.  However, patient denies any facial droop or arm weakness.  She stated that she had left eyelid proptosis and has been following with [name] for myasthenia gravis but currently all test negative.  She also follow with [name] for insomnia, sleep test has not been done yet.  She felt that she just too tired due to insomnia and she fell asleep.  Vitals:   08/26/19 0245 08/26/19 0446 08/26/19 0526 08/26/19 0546  BP: (!) 139/96 (!) 145/95 (!) 156/90 (!) 156/88  Pulse: 84 80 88 87  Resp: 20 19 (!) 23 18  Temp:      TempSrc:      SpO2: 100% 99% 100% 100%  Weight:      Height:       CBC:  Recent Labs  Lab 08/26/19 0021 08/26/19 0021 08/26/19 0032 08/26/19 0033  WBC 8.3  --   --   --   NEUTROABS 6.4  --   --   --   HGB 12.6   < > 13.6 13.6  HCT 38.7   < > 40.0 40.0  MCV 85.4  --   --   --   PLT 242  --   --   --    < > = values in this interval not displayed.   Basic Metabolic Panel:  Recent Labs  Lab 08/26/19 0021 08/26/19 0021 08/26/19 0032 08/26/19 0033  NA 142   < > 141 142  K 4.8   < > 4.8 4.8  CL 106  --   --  104  CO2 25  --   --   --   GLUCOSE 140*  --   --  136*  BUN 11  --   --  15  CREATININE 1.27*  --   --  1.20*  CALCIUM 9.3  --   --   --    < > = values in this interval not displayed.   Lipid Panel: No results for input(s): CHOL, TRIG, HDL, CHOLHDL, VLDL, LDLCALC in the  last 168 hours. HgbA1c: No results for input(s): HGBA1C in the last 168 hours. Urine Drug Screen:  Recent Labs  Lab 08/26/19 0611  LABOPIA NONE DETECTED  COCAINSCRNUR NONE DETECTED  LABBENZ POSITIVE*  AMPHETMU NONE DETECTED  THCU NONE DETECTED  LABBARB NONE DETECTED    Alcohol Level  Recent Labs  Lab 08/26/19 0021  ETH <10    IMAGING past 24 hours CT Code Stroke CTA Head W/WO contrast  Result Date: 08/26/2019 CLINICAL DATA:  Initial evaluation for acute left facial droop, left-sided weakness. EXAM: CT ANGIOGRAPHY HEAD AND NECK TECHNIQUE: Multidetector CT imaging of the head and neck was performed using the standard protocol during bolus administration of intravenous contrast. Multiplanar CT image reconstructions and MIPs were obtained to evaluate the vascular anatomy. Carotid stenosis measurements (when applicable)  are obtained utilizing NASCET criteria, using the distal internal carotid diameter as the denominator. CONTRAST:  34mL OMNIPAQUE IOHEXOL 350 MG/ML SOLN COMPARISON:  Prior head CT from earlier the same day. FINDINGS: CTA NECK FINDINGS Aortic arch: Partially visualized aortic arch of normal caliber with normal branch pattern. No flow-limiting stenosis seen about the origin of the great vessels. Visualized subclavian arteries widely patent. Right carotid system: Right common carotid artery widely patent from its origin to the bifurcation without stenosis. Mild eccentric plaque about the right bifurcation without significant stenosis. Right ICA widely patent distally to the skull base without stenosis, dissection or occlusion. Left carotid system: Left CCA widely patent from its origin to the bifurcation without stenosis. Mild atheromatous change about the left bifurcation without significant stenosis. Left ICA widely patent distally to the skull base without stenosis, dissection or occlusion. Vertebral arteries: Both vertebral arteries arise from the subclavian arteries. Vertebral  arteries widely patent within the neck without stenosis, dissection or occlusion. Skeleton: No visible acute osseous abnormality, although evaluation somewhat limited by lack of sagittal MPR sequence. No discrete or worrisome osseous lesions. Other neck: No other acute soft tissue abnormality within the neck. No mass lesion or adenopathy. Upper chest: Visualized upper chest demonstrates no acute finding. Subcentimeter calcified granuloma noted at the left upper lobe. Review of the MIP images confirms the above findings CTA HEAD FINDINGS Anterior circulation: Petrous, cavernous, and supraclinoid ICAs widely patent without stenosis or other abnormality. A1 segments patent bilaterally. Normal anterior communicating artery complex. Anterior cerebral arteries patent to their distal aspects without stenosis. No M1 stenosis or occlusion. Negative MCA bifurcations. Distal MCA branches well perfused and symmetric. Posterior circulation: Vertebral arteries patent to the vertebrobasilar junction without stenosis. Both picas patent. Basilar patent to its distal aspect without stenosis. Superior cerebral arteries patent bilaterally. Both PCAs primarily supplied via the basilar well perfused to their distal aspects. Small right posterior communicating artery noted. Venous sinuses: Grossly patent allowing for timing the contrast bolus. Anatomic variants: None significant.  No visible aneurysm. Review of the MIP images confirms the above findings IMPRESSION: 1. Negative CTA for emergent large vessel occlusion. 2. Mild atheromatous change about the carotid bifurcations without significant stenosis. 3. No other hemodynamically significant or correctable stenosis identified about the major arterial vasculature of the head and neck. Electronically Signed   By: Jeannine Boga M.D.   On: 08/26/2019 02:14   CT Code Stroke CTA Neck W/WO contrast  Result Date: 08/26/2019 CLINICAL DATA:  Initial evaluation for acute left facial  droop, left-sided weakness. EXAM: CT ANGIOGRAPHY HEAD AND NECK TECHNIQUE: Multidetector CT imaging of the head and neck was performed using the standard protocol during bolus administration of intravenous contrast. Multiplanar CT image reconstructions and MIPs were obtained to evaluate the vascular anatomy. Carotid stenosis measurements (when applicable) are obtained utilizing NASCET criteria, using the distal internal carotid diameter as the denominator. CONTRAST:  6mL OMNIPAQUE IOHEXOL 350 MG/ML SOLN COMPARISON:  Prior head CT from earlier the same day. FINDINGS: CTA NECK FINDINGS Aortic arch: Partially visualized aortic arch of normal caliber with normal branch pattern. No flow-limiting stenosis seen about the origin of the great vessels. Visualized subclavian arteries widely patent. Right carotid system: Right common carotid artery widely patent from its origin to the bifurcation without stenosis. Mild eccentric plaque about the right bifurcation without significant stenosis. Right ICA widely patent distally to the skull base without stenosis, dissection or occlusion. Left carotid system: Left CCA widely patent from its origin to the bifurcation  without stenosis. Mild atheromatous change about the left bifurcation without significant stenosis. Left ICA widely patent distally to the skull base without stenosis, dissection or occlusion. Vertebral arteries: Both vertebral arteries arise from the subclavian arteries. Vertebral arteries widely patent within the neck without stenosis, dissection or occlusion. Skeleton: No visible acute osseous abnormality, although evaluation somewhat limited by lack of sagittal MPR sequence. No discrete or worrisome osseous lesions. Other neck: No other acute soft tissue abnormality within the neck. No mass lesion or adenopathy. Upper chest: Visualized upper chest demonstrates no acute finding. Subcentimeter calcified granuloma noted at the left upper lobe. Review of the MIP images  confirms the above findings CTA HEAD FINDINGS Anterior circulation: Petrous, cavernous, and supraclinoid ICAs widely patent without stenosis or other abnormality. A1 segments patent bilaterally. Normal anterior communicating artery complex. Anterior cerebral arteries patent to their distal aspects without stenosis. No M1 stenosis or occlusion. Negative MCA bifurcations. Distal MCA branches well perfused and symmetric. Posterior circulation: Vertebral arteries patent to the vertebrobasilar junction without stenosis. Both picas patent. Basilar patent to its distal aspect without stenosis. Superior cerebral arteries patent bilaterally. Both PCAs primarily supplied via the basilar well perfused to their distal aspects. Small right posterior communicating artery noted. Venous sinuses: Grossly patent allowing for timing the contrast bolus. Anatomic variants: None significant.  No visible aneurysm. Review of the MIP images confirms the above findings IMPRESSION: 1. Negative CTA for emergent large vessel occlusion. 2. Mild atheromatous change about the carotid bifurcations without significant stenosis. 3. No other hemodynamically significant or correctable stenosis identified about the major arterial vasculature of the head and neck. Electronically Signed   By: Jeannine Boga M.D.   On: 08/26/2019 02:14   MR BRAIN WO CONTRAST  Result Date: 08/26/2019 CLINICAL DATA:  Left-sided weakness and left facial droop. EXAM: MRI HEAD WITHOUT CONTRAST TECHNIQUE: Multiplanar, multiecho pulse sequences of the brain and surrounding structures were obtained without intravenous contrast. COMPARISON:  CT and CTA from earlier today FINDINGS: Brain: No acute infarction, hemorrhage, hydrocephalus, extra-axial collection or mass lesion. Normal brain volume. Mild chronic small vessel ischemic type change in the cerebral white matter and pons-patient has multiple vascular risk factors. The bilateral globus pallidus has a normal  appearance. Vascular: Normal flow voids Skull and upper cervical spine: Negative Sinuses/Orbits: Partial right mastoid opacification. IMPRESSION: 1. No acute finding.  No MR findings of carbon monoxide poisoning. 2. Mild chronic small vessel ischemia. Electronically Signed   By: Monte Fantasia M.D.   On: 08/26/2019 07:43   DG Chest Port 1 View  Result Date: 08/26/2019 CLINICAL DATA:  59 year old female with code stroke. EXAM: PORTABLE CHEST 1 VIEW COMPARISON:  Chest radiograph dated 03/31/2019. FINDINGS: Minimal left lung base atelectasis. No focal consolidation, pleural effusion, or pneumothorax. The cardiac silhouette is within limits. No acute osseous pathology. IMPRESSION: No active disease. Electronically Signed   By: Anner Crete M.D.   On: 08/26/2019 03:25   CT HEAD CODE STROKE WO CONTRAST  Result Date: 08/26/2019 CLINICAL DATA:  Code stroke. Initial evaluation for possible stroke, left-sided weakness with left facial droop. EXAM: CT HEAD WITHOUT CONTRAST TECHNIQUE: Contiguous axial images were obtained from the base of the skull through the vertex without intravenous contrast. COMPARISON:  None available. FINDINGS: Brain: Cerebral volume within normal limits for age. No acute intracranial hemorrhage. No acute large vessel territory infarct. Subcentimeter lucency at the inferior aspect of the left lentiform nucleus favored to reflect a small dilated perivascular space. No mass lesion, midline shift or  mass effect. No hydrocephalus or extra-axial fluid collection. At least partially empty sella noted. Vascular: No hyperdense vessel. Skull: Scalp soft tissues and calvarium within normal limits. Sinuses/Orbits: Globes and orbital soft tissues demonstrate no acute finding. Paranasal sinuses are clear. Trace chronic right mastoid effusion noted, of doubtful significance. Other: None. ASPECTS [facility] Stroke Program Early CT Score) - Ganglionic level infarction (caudate, lentiform nuclei, internal  capsule, insula, M1-M3 cortex): 7 - Supraganglionic infarction (M4-M6 cortex): 3 Total score (0-10 with 10 being normal): 10 IMPRESSION: 1. Negative head CT.  No acute intracranial abnormality identified. 2. ASPECTS is 10. Critical Value/emergent results were called by telephone at the time of interpretation on 08/26/2019 at 1:02 am to provider Dr. Cheral Marker, who verbally acknowledged these results. Electronically Signed   By: Jeannine Boga M.D.   On: 08/26/2019 01:10    PHYSICAL EXAM  Temp:  [97.7 F (36.5 C)-98.1 F (36.7 C)] 97.7 F (36.5 C) (07/14 1830) Pulse Rate:  [78-89] 87 (07/14 0546) Resp:  [18-35] 18 (07/14 0546) BP: (124-156)/(84-96) 156/88 (07/14 0546) SpO2:  [94 %-100 %] 100 % (07/14 1130) FiO2 (%):  [21 %] 21 % (07/14 1130) Weight:  [83.2 kg] 83.2 kg (07/14 0029)  General - Well nourished, well developed, in no apparent distress.  Ophthalmologic - fundi not visualized due to noncooperation.  Cardiovascular - Regular rhythm and rate.  Mental Status -  Level of arousal and orientation to time, place, and person were intact. Language including expression, naming, repetition, comprehension was assessed and found intact. Attention span and concentration were normal. Fund of Knowledge was assessed and was intact.  Cranial Nerves II - XII - II - Visual field intact OU. III, IV, VI - Extraocular movements intact.  No significant ptosis on the left V - Facial sensation intact bilaterally. VII - Facial movement intact bilaterally. VIII - Hearing & vestibular intact bilaterally. X - Palate elevates symmetrically. XI - Chin turning & shoulder shrug intact bilaterally. XII - Tongue protrusion intact.  Motor Strength - The patient's strength was normal in all extremities and pronator drift was absent.  Bulk was normal and fasciculations were absent.   Motor Tone - Muscle tone was assessed at the neck and appendages and was normal.  Reflexes - The patient's reflexes were  symmetrical in all extremities and she had no pathological reflexes.  Sensory - Light touch, temperature/pinprick were assessed and were symmetrical.    Coordination - The patient had normal movements in the hands and feet with no ataxia or dysmetria.  Tremor was absent.  Gait and Station - deferred.   ASSESSMENT/PLAN Ms. Kathleen Sweeney is a 59 y.o. female with history of CKD, fibromyalgia, HLD, HTN, hypothyroidism, lupus, migraines and RLS presenting with L sided weakness and facial droop after falling asleep in house w/ lawnmower running outside house.  However, patient denies any facial droop or weakness.  ? Sleep disorder ? Narcolepsy ?  Code Stroke CT head No acute abnormality. ASPECTS 10.     CTA head & neck no LVO. Mild ICA bifurcation atherosclerosis.   MRI  No acute abnormality. No signs of carbon monoxide poisoning. Small vessel disease.   2D Echo EF 55 to 60%.  VTE prophylaxis - Lovenox 40 mg sq daily   No antithrombotic prior to admission, now on No antithrombotic.   History of insomnia - following with [name]  Sleep study planned but not performed yet  Pt will need to continue to follow up with [name] for evaluation  of sleep disorder.  ?  Myasthenia gravis  Reported right proptosis  Follows with [name] for evaluation of MG  So far testing negative  Continue follow-up with [name] at [facility]  Hypertension  Stable . BP goal normotensive  Hyperlipidemia  Home meds:  pravachol 10, resumed in hospital  LDL pending, goal < 70  Continue statin at discharge  Other Stroke Risk Factors  Former Cigarette smoker, quit 13 yrs ago  Hx Substance abuse, Marijuana + other - UDS:  THC NONE DETECTED, Cocaine NONE DETECTED Benzos POSITIVE  Migraines  Other Active Problems  Recent admission in June 2021 for sigmoid diverticulitis   Hypothyroidism   Depression/anxiety/insomnia  GERD on [facility] day # 0  Neurology will sign  off. Please call with questions. Pt will follow up with [name] and [name] at [facility]. Thanks for the consult.  Rosalin Hawking, MD PhD Stroke Neurology 08/26/2019 6:55 PM   To contact Stroke Continuity provider, please refer to [email or web address] After hours, contact General Neurology

## 2019-08-26 NOTE — ED Notes (Signed)
1:1 safety sitter at bedside, pt up with safety sitter to pt restroom,, pt gait steady
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
## 2019-08-26 NOTE — ED Notes (Signed)
Pt placed on Purewick and is aware that urine sample is needed.

## 2019-08-26 NOTE — ED Triage Notes (Signed)
Pt BIB [facility] as a Code Stroke from home.  Pt LSN was at 1800, 08/25/2019 with husband being last contact.  Pt was found in garage after pt stated she was going to mow grass and lawn mower was left on in garage.  Upon EMS arrival pt presented with LS Weakness and L. Facial Droop.  VSS with EMS: CBG 111 BP 111/72

## 2019-08-26 NOTE — Progress Notes (Signed)
  Echocardiogram 2D Echocardiogram has been performed with [facility].  Kathleen Sweeney 08/26/2019, 2:44 PM

## 2019-08-27 ENCOUNTER — Inpatient Hospital Stay (HOSPITAL_COMMUNITY)
Admission: AD | Admit: 2019-08-27 | Discharge: 2019-08-31 | DRG: 885 | Disposition: A | Discharge status: Home / Self Care | Payer: BC Managed Care – PPO | Attending: Psychiatry | Admitting: Psychiatry

## 2019-08-27 ENCOUNTER — Encounter (HOSPITAL_COMMUNITY): Payer: Self-pay | Admitting: Psychiatry

## 2019-08-27 ENCOUNTER — Other Ambulatory Visit: Payer: Self-pay

## 2019-08-27 DIAGNOSIS — F411 Generalized anxiety disorder: Secondary | ICD-10-CM

## 2019-08-27 DIAGNOSIS — F332 Major depressive disorder, recurrent severe without psychotic features: Secondary | ICD-10-CM | POA: Diagnosis not present

## 2019-08-27 DIAGNOSIS — M797 Fibromyalgia: Secondary | ICD-10-CM | POA: Diagnosis not present

## 2019-08-27 DIAGNOSIS — X838XXA Intentional self-harm by other specified means, initial encounter: Secondary | ICD-10-CM | POA: Diagnosis not present

## 2019-08-27 DIAGNOSIS — E039 Hypothyroidism, unspecified: Secondary | ICD-10-CM | POA: Diagnosis present

## 2019-08-27 DIAGNOSIS — R4182 Altered mental status, unspecified: Secondary | ICD-10-CM | POA: Diagnosis not present

## 2019-08-27 DIAGNOSIS — G4700 Insomnia, unspecified: Secondary | ICD-10-CM | POA: Diagnosis present

## 2019-08-27 DIAGNOSIS — I129 Hypertensive chronic kidney disease with stage 1 through stage 4 chronic kidney disease, or unspecified chronic kidney disease: Secondary | ICD-10-CM | POA: Diagnosis not present

## 2019-08-27 DIAGNOSIS — T5891XA Toxic effect of carbon monoxide from unspecified source, accidental (unintentional), initial encounter: Secondary | ICD-10-CM | POA: Diagnosis not present

## 2019-08-27 DIAGNOSIS — G2581 Restless legs syndrome: Secondary | ICD-10-CM | POA: Diagnosis present

## 2019-08-27 DIAGNOSIS — N189 Chronic kidney disease, unspecified: Secondary | ICD-10-CM | POA: Diagnosis present

## 2019-08-27 DIAGNOSIS — T5892XA Toxic effect of carbon monoxide from unspecified source, intentional self-harm, initial encounter: Secondary | ICD-10-CM | POA: Diagnosis not present

## 2019-08-27 DIAGNOSIS — Z79899 Other long term (current) drug therapy: Secondary | ICD-10-CM | POA: Diagnosis not present

## 2019-08-27 DIAGNOSIS — F23 Brief psychotic disorder: Secondary | ICD-10-CM | POA: Diagnosis not present

## 2019-08-27 DIAGNOSIS — E785 Hyperlipidemia, unspecified: Secondary | ICD-10-CM | POA: Diagnosis not present

## 2019-08-27 DIAGNOSIS — Z7729 Contact with and (suspected ) exposure to other hazardous substances: Secondary | ICD-10-CM

## 2019-08-27 DIAGNOSIS — T1491XA Suicide attempt, initial encounter: Secondary | ICD-10-CM

## 2019-08-27 DIAGNOSIS — Z87891 Personal history of nicotine dependence: Secondary | ICD-10-CM

## 2019-08-27 DIAGNOSIS — R531 Weakness: Secondary | ICD-10-CM | POA: Diagnosis not present

## 2019-08-27 DIAGNOSIS — F419 Anxiety disorder, unspecified: Secondary | ICD-10-CM | POA: Diagnosis not present

## 2019-08-27 DIAGNOSIS — Z20822 Contact with and (suspected) exposure to covid-19: Secondary | ICD-10-CM | POA: Diagnosis not present

## 2019-08-27 DIAGNOSIS — Y92094 Garage of other non-institutional residence as the place of occurrence of the external cause: Secondary | ICD-10-CM | POA: Diagnosis not present

## 2019-08-27 DIAGNOSIS — Y999 Unspecified external cause status: Secondary | ICD-10-CM | POA: Diagnosis not present

## 2019-08-27 DIAGNOSIS — R29810 Facial weakness: Secondary | ICD-10-CM | POA: Diagnosis not present

## 2019-08-27 DIAGNOSIS — F339 Major depressive disorder, recurrent, unspecified: Secondary | ICD-10-CM | POA: Diagnosis not present

## 2019-08-27 DIAGNOSIS — T588X2A Toxic effect of carbon monoxide from other source, intentional self-harm, initial encounter: Secondary | ICD-10-CM | POA: Diagnosis not present

## 2019-08-27 LAB — LIPID PANEL
Cholesterol: 240 mg/dL — ABNORMAL HIGH (ref 0–200)
HDL: 44 mg/dL (ref >40)
LDL Cholesterol: 141 mg/dL — ABNORMAL HIGH (ref 0–99)
Total CHOL/HDL Ratio: 5.5 RATIO
Triglycerides: 273 mg/dL — ABNORMAL HIGH (ref <150)
VLDL: 55 mg/dL — ABNORMAL HIGH (ref 0–40)

## 2019-08-27 MED ORDER — TRAZODONE HCL 50 MG PO TABS
50.0000 mg | ORAL_TABLET | Freq: Every evening | ORAL | Status: DC | PRN
Start: 2019-08-27 — End: 2019-08-27

## 2019-08-27 MED ORDER — PANTOPRAZOLE SODIUM 40 MG PO TBEC
40.0000 mg | DELAYED_RELEASE_TABLET | Freq: Every day | ORAL | Status: DC
  Administered 2019-08-28 – 2019-08-29 (×2): 40 mg via ORAL
  Filled 2019-08-27 (×3): qty 1

## 2019-08-27 MED ORDER — RISPERIDONE 2 MG PO TBDP
2.0000 mg | ORAL_TABLET | Freq: Three times a day (TID) | ORAL | Status: DC | PRN
  Administered 2019-08-30 (×3): 2 mg via ORAL
  Filled 2019-08-27 (×3): qty 1

## 2019-08-27 MED ORDER — ZOLPIDEM TARTRATE 5 MG PO TABS
5.0000 mg | ORAL_TABLET | Freq: Every evening | ORAL | Status: DC | PRN
  Administered 2019-08-27 – 2019-08-28 (×2): 5 mg via ORAL
  Filled 2019-08-27 (×2): qty 1

## 2019-08-27 MED ORDER — LORAZEPAM 1 MG PO TABS
1.0000 mg | ORAL_TABLET | Freq: Two times a day (BID) | ORAL | Status: DC
  Administered 2019-08-27 – 2019-08-29 (×4): 1 mg via ORAL
  Filled 2019-08-27 (×4): qty 1

## 2019-08-27 MED ORDER — MAGNESIUM HYDROXIDE 400 MG/5ML PO SUSP: 30.0000 mL | Freq: Every day | ORAL | Status: DC | PRN

## 2019-08-27 MED ORDER — HYDROXYZINE HCL 25 MG PO TABS
25.0000 mg | ORAL_TABLET | Freq: Three times a day (TID) | ORAL | Status: DC | PRN
  Administered 2019-08-27 – 2019-08-30 (×4): 25 mg via ORAL
  Filled 2019-08-27 (×4): qty 1

## 2019-08-27 MED ORDER — ZIPRASIDONE MESYLATE 20 MG IM SOLR: 20.0000 mg | INTRAMUSCULAR | Status: DC | PRN

## 2019-08-27 MED ORDER — LORAZEPAM 1 MG PO TABS
1.0000 mg | ORAL_TABLET | ORAL | Status: AC | PRN
  Administered 2019-08-30: 1 mg via ORAL
  Filled 2019-08-27 (×2): qty 1

## 2019-08-27 MED ORDER — DOXEPIN HCL 25 MG PO CAPS
25.0000 mg | ORAL_CAPSULE | Freq: Every day | ORAL | Status: DC
  Administered 2019-08-27: 25 mg via ORAL
  Filled 2019-08-27 (×3): qty 1

## 2019-08-27 MED ORDER — ALUM & MAG HYDROXIDE-SIMETH 200-200-20 MG/5ML PO SUSP: 30.0000 mL | ORAL | Status: DC | PRN

## 2019-08-27 MED ORDER — TIZANIDINE HCL 2 MG PO TABS
4.0000 mg | ORAL_TABLET | Freq: Three times a day (TID) | ORAL | Status: DC | PRN
  Administered 2019-08-27: 4 mg via ORAL
  Filled 2019-08-27: qty 2

## 2019-08-27 MED ORDER — LEVOTHYROXINE SODIUM 100 MCG PO TABS
100.0000 ug | ORAL_TABLET | Freq: Every day | ORAL | Status: DC
  Administered 2019-08-28: 100 ug via ORAL
  Filled 2019-08-27 (×4): qty 1

## 2019-08-27 MED ORDER — ACETAMINOPHEN 325 MG PO TABS
650.0000 mg | ORAL_TABLET | Freq: Four times a day (QID) | ORAL | Status: DC | PRN
  Administered 2019-08-28 – 2019-08-31 (×4): 650 mg via ORAL
  Filled 2019-08-27 (×4): qty 2

## 2019-08-27 NOTE — Tx Team (Signed)
Initial Treatment Plan 08/27/2019 6:51 PM Truitt Merle KHI:[id number]    PATIENT STRESSORS: Financial difficulties Health problems Marital or family conflict   PATIENT STRENGTHS: Ability for insight Average or above average intelligence Communication skills Supportive family/friends   PATIENT IDENTIFIED PROBLEMS: Depression  Suicidal ideation  Ineffective coping skills                 DISCHARGE CRITERIA:  Ability to meet basic life and health needs Adequate post-discharge living arrangements  PRELIMINARY DISCHARGE PLAN: Attend aftercare/continuing care group Outpatient therapy Return to previous living arrangement  PATIENT/FAMILY INVOLVEMENT: This treatment plan has been presented to and reviewed with the patient, Kathleen Sweeney, and/or family member.  The patient and family have been given the opportunity to ask questions and make suggestions.  Vela Prose, RN 08/27/2019, 6:51 PM

## 2019-08-27 NOTE — ED Notes (Signed)
Breakfast Ordered--Kathleen Sweeney  

## 2019-08-27 NOTE — Progress Notes (Signed)
PROGRESS NOTE                                                                                                                                                                                                             Patient Demographics:    Kathleen Sweeney, is a 59 y.o. female, DOB - 1960-04-15, ZOX:[id number]  Admit date - 08/26/2019   Admitting Physician Norval Morton, MD  Outpatient Primary MD for the patient is Maurice Small, MD  LOS - 0   Chief Complaint  Patient presents with  . Code Stroke       Brief Narrative    HPI: Kathleen Sweeney is a 59 y.o. female with medical history significant of hypertension, hyperlipidemia, hypothyroidism, lupus, RLS, and hypothyroidism presented to the hospital after calling 911 working up to alarms going off.  History is able to be obtained from patient now that she is more alert and awake.  She had taken the lawn mower out of the shed and had had a difficult time trying to get it started.  When she was able to get it started as she tied her mask around the throttle to keep it running.  She reports that she kept tomorrow morning outside of the basement and had gone back into the house to change shoes that she had flip-flops on at the time.  She thinks that she may have gotten something to drink and then somehow went upstairs and fell asleep somehow.  She awoke up to alarms going off and she was unable to figure out why they were going off and call 911.  She reports that she had no thoughts of wanting to hurt her self, but does have a difficult time with insomnia.  Her and her husband have been separated and he is currently staying at an friend's house, but also took their dog.  She had been in disagreement with her husband over the dog who is currently old and dying.  Patient had sent text messages to her son in regards to this situation whom she rarely talks to and at the of the text message said I will always love you.   Review of text messages to her other son also note the same phrase and she notes that that did not mean that she wanted to kill her self.  En  route with EMS patient was initially reported to have a carboxyhemoglobin level of 14 and there was concern for left-sided weakness and facial droop.    ED Course: Upon arrival into the emergency department patient was noted lethargic.  She was initially on a nonrebreather oxygen.  A code stroke had been called for concern of left-sided weakness.  CT scan of the brain negative for any acute abnormalities.  Labs were significant for creatinine 1.27 carboxyhemoglobin 8.9, and methemoglobin 1.1.  Urine drug screen was positive for benzodiazepines which are on the patient's prescription list. Subsequently MRI was also noted to be negative for any sign of a stroke.  Patient had been IVC for concern for suicidal ideation.  Repeat carboxyhemoglobin levels be within normal limits and patient started to wake up.    Subjective:    Reino Kent today patient denies any dizziness, lightheadedness, chest pain or shortness of breath .   Assessment  & Plan :    Active Problems:   Hypothyroid   Hyperlipidemia   Carbon monoxide poisoning   Anxiety   Left-sided weakness  Carbon-monoxide poisoning  -Carboxyhemoglobin level elevated initially on admission at 8.9, but this has normalized on recheck at 1.2. -Unclear if it was intentional versus unintentional, so she was involuntary committed, psychiatry has been consulted, per my discussion with psychiatric counselor, patient is not psychiatrically cleared for discharge home, and we do recommend psych admission, as she has been have giving conflicting stories, as well did send text messages to her son saying" goodbye" -Continue with [employer] cabin [employer] and suicide precaution.  Left-sided weakness: -This  has resolved,  Suspect symptoms secondary to above as CT and MRI of the brain negative for any acute abnormalities.   Echocardiograms revealed EF of 50-60%. Neurology has reevaluated and recommended no further work-up.  Anxiety:  - Home medications include Ativan 1 mg 3 times daily as needed for anxiety -Continue Ativan  Hypothyroidism:  -TSH 0.383 -Continue levothyroxine  Hyperlipidemia  -Continue pravastatin    COVID-19 Labs  No results for input(s): DDIMER, FERRITIN, LDH, CRP in the last 72 hours.  Lab Results  Component Value Date   [place] NEGATIVE 08/26/2019   [place] NEGATIVE 08/10/2019   SARSCOV2NAA NOT DETECTED 03/31/2019     Code Status : Full  Family Communication  : None at bedside  Disposition Plan  :   Patient will need inpatient psych admission per my discussion with psychiatric counselor, currently patient medical issues have cleared, and she is cleared for transfer to inpatient psych facility when bed is available.    Status is: Observation  The patient will require care spanning > 2 midnights and should be moved to inpatient because: Unsafe d/c plan  Dispo: The patient is from: Home              Anticipated d/c is to: Inpatient psych facility              Anticipated d/c date is: 1 day              Patient currently is not medically stable to d/c.  Patient remains involuntary committed, she remains on suicidal precaution, will need placement for at inpatient psych facility.        Barriers For Discharge :   Consults  :  Psychiatry  Procedures  : None  DVT Prophylaxis  :  [place] lovenox  Lab Results  Component Value Date   PLT 242 08/26/2019    Antibiotics  :  Anti-infectives (From admission, onward)   None        Objective:   Vitals:   08/27/19 0700 08/27/19 0759 08/27/19 0855 08/27/19 0858  BP: 130/85 (!) 143/95 (!) 152/87 (!) 164/85  Pulse: 74 78 76 83  Resp: 17 18 19  (!) 23  Temp:      TempSrc:      SpO2: 95% 94% 98% 95%  Weight:      Height:        Wt Readings from Last 3 Encounters:  08/26/19 83.2 kg  08/13/19  80.9 kg  07/06/19 80.7 kg    No intake or output data in the 24 hours ending 08/27/19 1459   Physical Exam  Awake Alert, Oriented X 3, No new F.N deficits, Normal affect Symmetrical Chest wall movement, Good air movement bilaterally, CTAB RRR,No Gallops,Rubs or new Murmurs, No Parasternal Heave +ve B.Sounds, Abd Soft, No tenderness,  No rebound - guarding or rigidity. No Cyanosis, Clubbing or edema, No new Rash or bruise      Data Review:    CBC Recent Labs  Lab 08/26/19 0021 08/26/19 0032 08/26/19 0033  WBC 8.3  --   --   HGB 12.6 13.6 13.6  HCT 38.7 40.0 40.0  PLT 242  --   --   MCV 85.4  --   --   MCH 27.8  --   --   MCHC 32.6  --   --   RDW 13.2  --   --   LYMPHSABS 1.4  --   --   MONOABS 0.4  --   --   EOSABS 0.0  --   --   BASOSABS 0.1  --   --     Chemistries  Recent Labs  Lab 08/26/19 0021 08/26/19 0032 08/26/19 0033  NA 142 141 142  K 4.8 4.8 4.8  CL 106  --  104  CO2 25  --   --   GLUCOSE 140*  --  136*  BUN 11  --  15  CREATININE 1.27*  --  1.20*  CALCIUM 9.3  --   --   AST 61*  --   --   ALT 29  --   --   ALKPHOS 84  --   --   BILITOT 0.9  --   --    ------------------------------------------------------------------------------------------------------------------ No results for input(s): CHOL, HDL, LDLCALC, TRIG, CHOLHDL, LDLDIRECT in the last 72 hours.  No results found for: HGBA1C ------------------------------------------------------------------------------------------------------------------ No results for input(s): TSH, T4TOTAL, T3FREE, THYROIDAB in the last 72 hours.  Invalid input(s): FREET3 ------------------------------------------------------------------------------------------------------------------ No results for input(s): VITAMINB12, FOLATE, FERRITIN, TIBC, IRON, RETICCTPCT in the last 72 hours.  Coagulation profile Recent Labs  Lab 08/26/19 0021  INR 1.0    No results for input(s): DDIMER in the last 72  hours.  Cardiac Enzymes No results for input(s): CKMB, TROPONINI, MYOGLOBIN in the last 168 hours.  Invalid input(s): CK ------------------------------------------------------------------------------------------------------------------ No results found for: BNP  Inpatient Medications  Scheduled Meds: . docusate sodium  100 mg Oral Daily  . doxepin  25-50 mg Oral QHS  . enoxaparin (LOVENOX) injection  40 mg Subcutaneous Q24H  . gabapentin  100 mg Oral BID  . levothyroxine  100 mcg Oral Daily  . pravastatin  10 mg Oral Daily   Continuous Infusions: PRN Meds:.acetaminophen **OR** acetaminophen (TYLENOL) oral liquid 160 mg/5 mL **OR** acetaminophen, LORazepam, pantoprazole, zolpidem  Micro Results Recent Results (from the past 240 hour(s))  SARS Coronavirus 2 by  RT PCR (hospital order, performed in [facility] hospital lab) Nasopharyngeal Nasopharyngeal Swab     Status: None   Collection Time: 08/26/19  2:15 AM   Specimen: Nasopharyngeal Swab  Result Value Ref Range Status   SARS Coronavirus 2 NEGATIVE NEGATIVE Final    Comment: (NOTE) SARS-CoV-2 target nucleic acids are NOT DETECTED.  The SARS-CoV-2 RNA is generally detectable in upper and lower respiratory specimens during the acute phase of infection. The lowest concentration of SARS-CoV-2 viral copies this assay can detect is 250 copies / mL. A negative result does not preclude SARS-CoV-2 infection and should not be used as the sole basis for treatment or other patient management decisions.  A negative result may occur with improper specimen collection / handling, submission of specimen other than nasopharyngeal swab, presence of viral mutation(s) within the areas targeted by this assay, and inadequate number of viral copies (<250 copies / mL). A negative result must be combined with clinical observations, patient history, and epidemiological information.  Fact Sheet for Patients:    StrictlyIdeas.no  Fact Sheet for Healthcare Providers: BankingDealers.co.za  This test is not yet approved or  cleared by the [place] FDA and has been authorized for detection and/or diagnosis of SARS-CoV-2 by FDA under an Emergency Use Authorization (EUA).  This EUA will remain in effect (meaning this test can be used) for the duration of the COVID-19 declaration under Section 564(b)(1) of the Act, 21 U.S.C. section 360bbb-3(b)(1), unless the authorization is terminated or revoked sooner.  Performed at [facility], [place] [address] [place], [place] [address]     Radiology Reports CT Code Stroke CTA Head W/WO contrast  Result Date: 08/26/2019 CLINICAL DATA:  Initial evaluation for acute left facial droop, left-sided weakness. EXAM: CT ANGIOGRAPHY HEAD AND NECK TECHNIQUE: Multidetector CT imaging of the head and neck was performed using the standard protocol during bolus administration of intravenous contrast. Multiplanar CT image reconstructions and MIPs were obtained to evaluate the vascular anatomy. Carotid stenosis measurements (when applicable) are obtained utilizing NASCET criteria, using the distal internal carotid diameter as the denominator. CONTRAST:  19mL OMNIPAQUE IOHEXOL 350 MG/ML SOLN COMPARISON:  Prior head CT from earlier the same day. FINDINGS: CTA NECK FINDINGS Aortic arch: Partially visualized aortic arch of normal caliber with normal branch pattern. No flow-limiting stenosis seen about the origin of the great vessels. Visualized subclavian arteries widely patent. Right carotid system: Right common carotid artery widely patent from its origin to the bifurcation without stenosis. Mild eccentric plaque about the right bifurcation without significant stenosis. Right ICA widely patent distally to the skull base without stenosis, dissection or occlusion. Left carotid system: Left CCA widely patent from its origin to the  bifurcation without stenosis. Mild atheromatous change about the left bifurcation without significant stenosis. Left ICA widely patent distally to the skull base without stenosis, dissection or occlusion. Vertebral arteries: Both vertebral arteries arise from the subclavian arteries. Vertebral arteries widely patent within the neck without stenosis, dissection or occlusion. Skeleton: No visible acute osseous abnormality, although evaluation somewhat limited by lack of sagittal MPR sequence. No discrete or worrisome osseous lesions. Other neck: No other acute soft tissue abnormality within the neck. No mass lesion or adenopathy. Upper chest: Visualized upper chest demonstrates no acute finding. Subcentimeter calcified granuloma noted at the left upper lobe. Review of the MIP images confirms the above findings CTA HEAD FINDINGS Anterior circulation: Petrous, cavernous, and supraclinoid ICAs widely patent without stenosis or other abnormality. A1 segments patent bilaterally.  Normal anterior communicating artery complex. Anterior cerebral arteries patent to their distal aspects without stenosis. No M1 stenosis or occlusion. Negative MCA bifurcations. Distal MCA branches well perfused and symmetric. Posterior circulation: Vertebral arteries patent to the vertebrobasilar junction without stenosis. Both picas patent. Basilar patent to its distal aspect without stenosis. Superior cerebral arteries patent bilaterally. Both PCAs primarily supplied via the basilar well perfused to their distal aspects. Small right posterior communicating artery noted. Venous sinuses: Grossly patent allowing for timing the contrast bolus. Anatomic variants: None significant.  No visible aneurysm. Review of the MIP images confirms the above findings IMPRESSION: 1. Negative CTA for emergent large vessel occlusion. 2. Mild atheromatous change about the carotid bifurcations without significant stenosis. 3. No other hemodynamically significant or  correctable stenosis identified about the major arterial vasculature of the head and neck. Electronically Signed   By: Jeannine Boga M.D.   On: 08/26/2019 02:14   CT Code Stroke CTA Neck W/WO contrast  Result Date: 08/26/2019 CLINICAL DATA:  Initial evaluation for acute left facial droop, left-sided weakness. EXAM: CT ANGIOGRAPHY HEAD AND NECK TECHNIQUE: Multidetector CT imaging of the head and neck was performed using the standard protocol during bolus administration of intravenous contrast. Multiplanar CT image reconstructions and MIPs were obtained to evaluate the vascular anatomy. Carotid stenosis measurements (when applicable) are obtained utilizing NASCET criteria, using the distal internal carotid diameter as the denominator. CONTRAST:  80mL OMNIPAQUE IOHEXOL 350 MG/ML SOLN COMPARISON:  Prior head CT from earlier the same day. FINDINGS: CTA NECK FINDINGS Aortic arch: Partially visualized aortic arch of normal caliber with normal branch pattern. No flow-limiting stenosis seen about the origin of the great vessels. Visualized subclavian arteries widely patent. Right carotid system: Right common carotid artery widely patent from its origin to the bifurcation without stenosis. Mild eccentric plaque about the right bifurcation without significant stenosis. Right ICA widely patent distally to the skull base without stenosis, dissection or occlusion. Left carotid system: Left CCA widely patent from its origin to the bifurcation without stenosis. Mild atheromatous change about the left bifurcation without significant stenosis. Left ICA widely patent distally to the skull base without stenosis, dissection or occlusion. Vertebral arteries: Both vertebral arteries arise from the subclavian arteries. Vertebral arteries widely patent within the neck without stenosis, dissection or occlusion. Skeleton: No visible acute osseous abnormality, although evaluation somewhat limited by lack of sagittal MPR sequence. No  discrete or worrisome osseous lesions. Other neck: No other acute soft tissue abnormality within the neck. No mass lesion or adenopathy. Upper chest: Visualized upper chest demonstrates no acute finding. Subcentimeter calcified granuloma noted at the left upper lobe. Review of the MIP images confirms the above findings CTA HEAD FINDINGS Anterior circulation: Petrous, cavernous, and supraclinoid ICAs widely patent without stenosis or other abnormality. A1 segments patent bilaterally. Normal anterior communicating artery complex. Anterior cerebral arteries patent to their distal aspects without stenosis. No M1 stenosis or occlusion. Negative MCA bifurcations. Distal MCA branches well perfused and symmetric. Posterior circulation: Vertebral arteries patent to the vertebrobasilar junction without stenosis. Both picas patent. Basilar patent to its distal aspect without stenosis. Superior cerebral arteries patent bilaterally. Both PCAs primarily supplied via the basilar well perfused to their distal aspects. Small right posterior communicating artery noted. Venous sinuses: Grossly patent allowing for timing the contrast bolus. Anatomic variants: None significant.  No visible aneurysm. Review of the MIP images confirms the above findings IMPRESSION: 1. Negative CTA for emergent large vessel occlusion. 2. Mild atheromatous change about the carotid  bifurcations without significant stenosis. 3. No other hemodynamically significant or correctable stenosis identified about the major arterial vasculature of the head and neck. Electronically Signed   By: Jeannine Boga M.D.   On: 08/26/2019 02:14   MR BRAIN WO CONTRAST  Result Date: 08/26/2019 CLINICAL DATA:  Left-sided weakness and left facial droop. EXAM: MRI HEAD WITHOUT CONTRAST TECHNIQUE: Multiplanar, multiecho pulse sequences of the brain and surrounding structures were obtained without intravenous contrast. COMPARISON:  CT and CTA from earlier today FINDINGS:  Brain: No acute infarction, hemorrhage, hydrocephalus, extra-axial collection or mass lesion. Normal brain volume. Mild chronic small vessel ischemic type change in the cerebral white matter and pons-patient has multiple vascular risk factors. The bilateral globus pallidus has a normal appearance. Vascular: Normal flow voids Skull and upper cervical spine: Negative Sinuses/Orbits: Partial right mastoid opacification. IMPRESSION: 1. No acute finding.  No MR findings of carbon monoxide poisoning. 2. Mild chronic small vessel ischemia. Electronically Signed   By: Monte Fantasia M.D.   On: 08/26/2019 07:43   CT ABDOMEN PELVIS W CONTRAST  Result Date: 08/10/2019 CLINICAL DATA:  Abdominal pain and vomiting. EXAM: CT ABDOMEN AND PELVIS WITH CONTRAST TECHNIQUE: Multidetector CT imaging of the abdomen and pelvis was performed using the standard protocol following bolus administration of intravenous contrast. CONTRAST:  116mL OMNIPAQUE IOHEXOL 300 MG/ML  SOLN COMPARISON:  CT scan 06/22/2019 FINDINGS: Lower chest: The lung bases are clear of acute process. No pleural effusion or pulmonary lesions. The heart is normal in size. No pericardial effusion. The distal esophagus and aorta are unremarkable. Hepatobiliary: No focal hepatic lesions or intrahepatic biliary dilatation. The gallbladder appears normal. No common bile duct dilatation. The portal and hepatic veins are patent. Pancreas: No mass, inflammation or ductal dilatation. Small duodenal diverticulum noted near the pancreatic head. Spleen: Small calcified granulomas but no mass. Normal size. Adrenals/Urinary Tract: The adrenal glands and kidneys are unremarkable. The bladder is unremarkable. Stomach/Bowel: The stomach, duodenum and small bowel are unremarkable. No acute inflammatory changes, mass lesions or obstructive findings. The terminal ileum is normal. The appendix is surgically absent. Changes of acute uncomplicated diverticulitis involving the mid sigmoid  colon. Severe sigmoid colon diverticulosis. No abscess or free air. Stable significant diverticulosis involving the descending colon also. Vascular/Lymphatic: Age advanced atherosclerotic calcifications involving the aorta and iliac arteries but no aneurysm or dissection. The branch vessels are patent. The major venous structures are patent. No mesenteric or retroperitoneal mass or adenopathy. Reproductive: Surgically absent. Other: No pelvic mass or adenopathy. No free pelvic fluid collections. No inguinal mass or adenopathy. No abdominal wall hernia or subcutaneous lesions. Musculoskeletal: No significant bony findings. IMPRESSION: 1. Acute uncomplicated diverticulitis involving the mid sigmoid colon. 2. No other significant abdominal/pelvic findings, mass lesions or adenopathy. 3. Age advanced atherosclerotic calcifications involving the aorta and iliac arteries. Aortic Atherosclerosis (ICD10-I70.0). Electronically Signed   By: Marijo Sanes M.D.   On: 08/10/2019 13:01   DG Chest Port 1 View  Result Date: 08/26/2019 CLINICAL DATA:  59 year old female with code stroke. EXAM: PORTABLE CHEST 1 VIEW COMPARISON:  Chest radiograph dated 03/31/2019. FINDINGS: Minimal left lung base atelectasis. No focal consolidation, pleural effusion, or pneumothorax. The cardiac silhouette is within limits. No acute osseous pathology. IMPRESSION: No active disease. Electronically Signed   By: Anner Crete M.D.   On: 08/26/2019 03:25   NCV with EMG(electromyography)  Result Date: 07/30/2019 [name], MD     08/06/2019  3:44 PM  [facility] NCS (NERVE CONDUCTION STUDY)  WITH EMG (ELECTROMYOGRAPHY) REPORT STUDY DATE: 07/30/19 PATIENT NAME: MALLIKA SANMIGUEL DOB: [birth year] MRN: [id number] ORDERING CLINICIAN: Andrey Spearman, MD TECHNOLOGIST: Sherre Scarlet ELECTROMYOGRAPHER: Earlean Polka. Penumalli, MD CLINICAL INFORMATION: 59 year old female with ptosis and weakness.  Evaluate for myasthenia gravis. FINDINGS:  NERVE CONDUCTION STUDY: Right median, right ulnar, right peroneal and right tibial motor responses normal. Right sural, right superficial peroneal, right median and right ulnar sensory responses are normal. Right tibial and right ulnar F-wave latencies are normal. Repetitive nerve stimulation of right spinal accessory nerve and recording of right trapezius muscle was performed at baseline, immediately after exertion and at 1 minute increments up to 5 minutes after exertion.  No significant decremental response was noted at baseline or following exertion. NEEDLE ELECTROMYOGRAPHY: Needle examination of right upper extremity is normal. IMPRESSION: This is a normal study.  No electrodiagnostic evidence of large fiber neuropathy, myopathy or neuromuscular junction disorder at this time. INTERPRETING PHYSICIAN: [name], MD Certified in Neurology, Neurophysiology and Neuroimaging [facility] [address] [place], [place] [address] [phone] [facility]   Nerve / Sites Muscle Latency Ref. Amplitude Ref. Rel Amp Segments Distance Velocity Ref. Area   ms ms mV mV %  cm m/s m/s mVms R Median - APB    Wrist APB 3.0 ?4.4 8.0 ?4.0 100 Wrist - APB 7   26.8    Upper arm APB 7.1  7.5  93.4 Upper arm - Wrist 23 55 ?49 24.5 R Ulnar - ADM    Wrist ADM 2.7 ?3.3 9.2 ?6.0 100 Wrist - ADM 7   24.4    B.Elbow ADM 6.0  8.5  92.2 B.Elbow - Wrist 21 63 ?49 24.1    A.Elbow ADM 7.9  8.5  101 A.Elbow - B.Elbow 10 53 ?49 24.2        A.Elbow - Wrist     R Peroneal - EDB    Ankle EDB 3.5 ?6.5 4.8 ?2.0 100 Ankle - EDB 9   19.8    Fib head EDB 10.1  4.1  85.8 Fib head - Ankle 32 48 ?44 20.6    Pop fossa EDB 12.2  4.4  106 Pop fossa - Fib head 10 49 ?44 21.3        Pop fossa - Ankle     R Tibial - AH    Ankle AH 3.6 ?5.8 8.7 ?4.0 100 Ankle - AH 9   13.3    Pop fossa AH 13.0  4.9  56.4 Pop fossa - Ankle 41 44 ?41 13.4           SNC   Nerve / Sites Rec. Site Peak Lat Ref.  Amp Ref. Segments Distance   ms ms V V  cm R  Sural - Ankle (Calf)    Calf Ankle 3.7 ?4.4 15 ?6 Calf - Ankle 14 R Superficial peroneal - Ankle    Lat leg Ankle 2.5 ?4.4 31 ?6 Lat leg - Ankle 14 R Median - Orthodromic (Dig II, Mid palm)    Dig II Wrist 2.7 ?3.4 20 ?10 Dig II - Wrist 13 R Ulnar - Orthodromic, (Dig V, Mid palm)    Dig V Wrist 2.5 ?3.1 11 ?5 Dig V - Wrist 75           F  Wave   Nerve F Lat Ref.  ms ms R Tibial - AH 52.7 ?56.0 R Ulnar - ADM 28.4 ?32.0       Rep Stim  Anatomy / Train Rate Ampl. Ampl 4-1 Fac Ampl Area Area 4-1 Fac Area Time  Hz mV % % mVms % %  R Trapezius (upper) - (Accessory spinal) Baseline @1Hz  1 5.3 0.3 100 51.7 2.6 100 0:00:00 Baseline @3Hz  3 5.5 4.7 103 52.5 20 101 0:00:26 Post Exercise @0 :00 3 4.8 -5 90.1 43.6 6.1 84.2 0:01:52 @ 0:30 3 5.7 5.1 106 52.8 17.1 102 0:02:42 @ 1:00 3 5.8 11 109 54.4 18.3 105 0:03:13 @ 2:00 3 5.7 4.2 107 54.5 20.5 105 0:04:14 @ 3:00 3 5.8 5.7 109 54.6 20 106 0:05:15 @ 4:00 3 5.8 9.1 109 54.1 16.9 105 0:06:16 @ 5:00 3 5.8 6.3 108 54.4 19.8 105 0:07:17     EMG Summary Table   Spontaneous MUAP Recruitment Muscle IA Fib PSW Fasc Other Amp Dur. Poly Pattern R. Deltoid Normal None None None _______ Normal Normal Normal Normal R. Biceps brachii Normal None None None _______ Normal Normal Normal Normal R. Triceps brachii Normal None None None _______ Normal Normal Normal Normal R. Flexor carpi radialis Normal None None None _______ Normal Normal Normal Normal R. First dorsal interosseous Normal None None None _______ Normal Normal Normal Normal    ECHOCARDIOGRAM COMPLETE  Result Date: 08/26/2019    ECHOCARDIOGRAM REPORT   Patient Name:   NAASIA WEILBACHER Date of Exam: 08/26/2019 Medical Rec #:  [id number]        Height:       69.0 in Accession #:    [id number]       Weight:       183.4 lb Date of Birth:  [birth year]         BSA:          1.991 m Patient Age:    1 years         BP:           156/102 mmHg Patient Gender: F                HR:           84 bpm. Exam Location:  Inpatient Procedure: 2D Echo,  Cardiac Doppler, Color Doppler and Intracardiac            Opacification Agent Indications:    TIA  History:        Patient has no prior history of Echocardiogram examinations.                 Risk Factors:Hypertension, Dyslipidemia and Former Smoker.  Sonographer:    Clayton Lefort RDCS (AE) Referring Phys: [id number] [facility] A SMITH  Sonographer Comments: Suboptimal apical window and suboptimal subcostal window. IMPRESSIONS  1. Left ventricular ejection fraction, by estimation, is 55 to 60%. The left ventricle has normal function. The left ventricle has no regional wall motion abnormalities. There is mild concentric left ventricular hypertrophy. Left ventricular diastolic parameters are consistent with Grade I diastolic dysfunction (impaired relaxation).  2. Right ventricular systolic function is normal. The right ventricular size is normal.  3. The mitral valve is normal in structure. No evidence of mitral valve regurgitation. No evidence of mitral stenosis.  4. The aortic valve is tricuspid. Aortic valve regurgitation is not visualized. No aortic stenosis is present.  5. Aortic dilatation noted. There is mild dilatation of the aortic root measuring 39 mm.  6. The inferior vena cava is normal in size with greater than 50% respiratory variability, suggesting right atrial pressure of 3 mmHg. FINDINGS  Left Ventricle: Left ventricular ejection fraction,  by estimation, is 55 to 60%. The left ventricle has normal function. The left ventricle has no regional wall motion abnormalities. Definity contrast agent was given IV to delineate the left ventricular  endocardial borders. The left ventricular internal cavity size was normal in size. There is mild concentric left ventricular hypertrophy. Left ventricular diastolic parameters are consistent with Grade I diastolic dysfunction (impaired relaxation). Right Ventricle: The right ventricular size is normal. No increase in right ventricular wall thickness. Right ventricular  systolic function is normal. Left Atrium: Left atrial size was normal in size. Right Atrium: Right atrial size was normal in size. Pericardium: Trivial pericardial effusion is present. Mitral Valve: The mitral valve is normal in structure. Normal mobility of the mitral valve leaflets. No evidence of mitral valve regurgitation. No evidence of mitral valve stenosis. MV peak gradient, 2.6 mmHg. The mean mitral valve gradient is 1.0 mmHg. Tricuspid Valve: The tricuspid valve is normal in structure. Tricuspid valve regurgitation is not demonstrated. No evidence of tricuspid stenosis. Aortic Valve: The aortic valve is tricuspid. Aortic valve regurgitation is not visualized. No aortic stenosis is present. Aortic valve mean gradient measures 3.0 mmHg. Aortic valve peak gradient measures 4.8 mmHg. Aortic valve area, by VTI measures 2.15 cm. Pulmonic Valve: The pulmonic valve was normal in structure. Pulmonic valve regurgitation is not visualized. No evidence of pulmonic stenosis. Aorta: Aortic dilatation noted. There is mild dilatation of the aortic root measuring 39 mm. Venous: The inferior vena cava is normal in size with greater than 50% respiratory variability, suggesting right atrial pressure of 3 mmHg. IAS/Shunts: No atrial level shunt detected by color flow Doppler.  LEFT VENTRICLE PLAX 2D LVIDd:         4.70 cm  Diastology LVIDs:         3.40 cm  LV e' lateral:   7.29 cm/s LV PW:         1.20 cm  LV E/e' lateral: 6.7 LV IVS:        1.17 cm  LV e' medial:    5.44 cm/s LVOT diam:     2.10 cm  LV E/e' medial:  9.0 LV SV:         43 LV SV Index:   22 LVOT Area:     3.46 cm  RIGHT VENTRICLE RV S prime:     14.10 cm/s TAPSE (M-mode): 2.6 cm LEFT ATRIUM             Index       RIGHT ATRIUM           Index LA diam:        3.20 cm 1.61 cm/m  RA Area:     13.80 cm LA Vol (A2C):   40.8 ml 20.49 ml/m RA Volume:   30.50 ml  15.32 ml/m LA Vol (A4C):   58.1 ml 29.18 ml/m LA Biplane Vol: 53.1 ml 26.66 ml/m  AORTIC VALVE AV  Area (Vmax):    2.11 cm AV Area (Vmean):   1.85 cm AV Area (VTI):     2.15 cm AV Vmax:           109.00 cm/s AV Vmean:          76.800 cm/s AV VTI:            0.201 m AV Peak Grad:      4.8 mmHg AV Mean Grad:      3.0 mmHg LVOT Vmax:         66.30 cm/s  LVOT Vmean:        41.100 cm/s LVOT VTI:          0.125 m LVOT/AV VTI ratio: 0.62  AORTA Ao Root diam: 3.90 cm Ao Asc diam:  3.30 cm MITRAL VALVE MV Area (PHT): 4.80 cm    SHUNTS MV Peak grad:  2.6 mmHg    Systemic VTI:  0.12 m MV Mean grad:  1.0 mmHg    Systemic Diam: 2.10 cm MV Vmax:       0.80 m/s MV Vmean:      47.9 cm/s MV Decel Time: 158 msec MV E velocity: 48.70 cm/s MV A velocity: 73.40 cm/s MV E/A ratio:  0.66 Skeet Latch MD Electronically signed by Skeet Latch MD Signature Date/Time: 08/26/2019/3:47:58 PM    Final    CT HEAD CODE STROKE WO CONTRAST  Result Date: 08/26/2019 CLINICAL DATA:  Code stroke. Initial evaluation for possible stroke, left-sided weakness with left facial droop. EXAM: CT HEAD WITHOUT CONTRAST TECHNIQUE: Contiguous axial images were obtained from the base of the skull through the vertex without intravenous contrast. COMPARISON:  None available. FINDINGS: Brain: Cerebral volume within normal limits for age. No acute intracranial hemorrhage. No acute large vessel territory infarct. Subcentimeter lucency at the inferior aspect of the left lentiform nucleus favored to reflect a small dilated perivascular space. No mass lesion, midline shift or mass effect. No hydrocephalus or extra-axial fluid collection. At least partially empty sella noted. Vascular: No hyperdense vessel. Skull: Scalp soft tissues and calvarium within normal limits. Sinuses/Orbits: Globes and orbital soft tissues demonstrate no acute finding. Paranasal sinuses are clear. Trace chronic right mastoid effusion noted, of doubtful significance. Other: None. ASPECTS [facility] Stroke Program Early CT Score) - Ganglionic level infarction (caudate, lentiform nuclei,  internal capsule, insula, M1-M3 cortex): 7 - Supraganglionic infarction (M4-M6 cortex): 3 Total score (0-10 with 10 being normal): 10 IMPRESSION: 1. Negative head CT.  No acute intracranial abnormality identified. 2. ASPECTS is 10. Critical Value/emergent results were called by telephone at the time of interpretation on 08/26/2019 at 1:02 am to provider Dr. Cheral Marker, who verbally acknowledged these results. Electronically Signed   By: Jeannine Boga M.D.   On: 08/26/2019 01:10     Phillips Climes M.D on 08/27/2019 at 2:59 PM    [facility] -  Office  [phone]

## 2019-08-27 NOTE — ED Notes (Signed)
Lunch Tray Ordered @ 1120. 

## 2019-08-27 NOTE — Progress Notes (Signed)
Patient remains in the ED at this time. Will attempt to re consult once she is stable and on the medical floor.

## 2019-08-27 NOTE — BH Assessment (Signed)
Patient accepted to [facility] by Mordecai Maes, NP. The attending provider is Dr. Mallie Darting. Room assignment [id number]. Nurse report [phone]. Please send around 5pm.
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
## 2019-08-27 NOTE — Progress Notes (Signed)
Pt reports she is fully independent, was being seen by OT when PT arrived.  Follow up with nursing to ask rehab to return if pt decides further therapy is needed.   08/27/19 1146  PT Visit Information  Last PT Received On 08/27/19  Reason Eval/Treat Not Completed Other (comment)    Kathleen Sweeney, PT MS Acute Rehab Dept. Number: [place]

## 2019-08-27 NOTE — BH Assessment (Signed)
Patient meets criteria for inpatient treatment per Mordecai Maes, NP. Patient to be accepted to [facility].

## 2019-08-27 NOTE — Progress Notes (Signed)
Pt stated she has issues with depression and sleeping. Pt encouraged to try to relax to allow the medication to work.     08/27/19 2200  Psych Admission Type (Psych Patients Only)  Admission Status Involuntary  Psychosocial Assessment  Patient Complaints Anxiety  Eye Contact Fair  Facial Expression Sad  Affect Anxious  Speech Soft  Interaction Cautious  Motor Activity Slow  Appearance/Hygiene Disheveled  Behavior Characteristics Cooperative  Mood Anxious  Aggressive Behavior  Effect No apparent injury  Thought Process  Coherency Circumstantial  Content WDL  Delusions WDL  Perception WDL  Hallucination None reported or observed  Judgment Poor  Confusion WDL  Danger to Self  Current suicidal ideation? Denies  Danger to Others  Danger to Others None reported or observed

## 2019-08-27 NOTE — Progress Notes (Signed)
The patient rated her day as a 3 out of a possible 10 since her discharge was cancelled for today. Her goal for tomorrow is to go home.
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
## 2019-08-27 NOTE — Progress Notes (Signed)
Admission Note: Patient is a 59 year old female admitted to the unit from [facility] under IVC status for symptoms of depression and suicidal ideation.  Patient denies suicidal ideation, auditory and visual hallucinations.  Report history of chronic insomnia, Diverticulitis and Fibromyalgia.  Per ED report: patient left the lawn mower running in her garage while she went upstairs to have a drink. Patient fell asleep while upstairs but woke up to the alarm going off.  She report stressor as been separated from husband and not having her dog with her.  Patient is alert and oriented x 4.  Presents with anxious affect and mood.  Admission plan of care reviewed and treatment consent signed.  Skin assessment and personal belongings completed.  Skin is dry and intact.  No contraband found.  Patient oriented to the unit, staff and room.  Verbalizes understanding of unit rules and protocols.  Routine safety checks initiated.  Patient is safe on the unit.

## 2019-08-27 NOTE — Progress Notes (Signed)
Patient is medically cleared for disposition for inpatient psych facility as deemed necessary by psychiatry, no  Further  medical work-up is indicated at this point, she can be placed for inpatient psych treatment once bed available, detailed progress note will follow soon. Phillips Climes MD

## 2019-08-28 DIAGNOSIS — F339 Major depressive disorder, recurrent, unspecified: Secondary | ICD-10-CM

## 2019-08-28 DIAGNOSIS — G4700 Insomnia, unspecified: Secondary | ICD-10-CM

## 2019-08-28 MED ORDER — LEVOTHYROXINE SODIUM 75 MCG PO TABS
75.0000 ug | ORAL_TABLET | Freq: Every day | ORAL | Status: DC
  Administered 2019-08-29 – 2019-08-31 (×3): 75 ug via ORAL
  Filled 2019-08-28 (×6): qty 1

## 2019-08-28 MED ORDER — OLANZAPINE 5 MG PO TBDP
15.0000 mg | ORAL_TABLET | Freq: Every day | ORAL | Status: DC
  Administered 2019-08-29 – 2019-08-30 (×2): 15 mg via ORAL
  Filled 2019-08-28 (×4): qty 1

## 2019-08-28 MED ORDER — FLUCONAZOLE 150 MG PO TABS
150.0000 mg | ORAL_TABLET | ORAL | Status: DC
  Administered 2019-08-28: 150 mg via ORAL
  Filled 2019-08-28 (×2): qty 1

## 2019-08-28 MED ORDER — OLANZAPINE 10 MG PO TBDP
10.0000 mg | ORAL_TABLET | Freq: Every day | ORAL | Status: DC
  Filled 2019-08-28 (×2): qty 1

## 2019-08-28 MED ORDER — OLANZAPINE 10 MG PO TBDP
10.0000 mg | ORAL_TABLET | Freq: Every day | ORAL | Status: DC
  Filled 2019-08-28: qty 1

## 2019-08-28 NOTE — H&P (Signed)
Psychiatric Admission Assessment Adult  Patient Identification: Kathleen Sweeney MRN:  [id number] Date of Evaluation:  08/28/2019 Chief Complaint:  Altered mental status [R41.82] Principal Diagnosis: <principal problem not specified> Diagnosis:  Active Problems:   Altered mental status  History of Present Illness: Patient is seen and examined.  Patient is a 59 year old female with a past psychiatric history significant for depression who presented to the [facility] emergency department on 08/26/2019.  The patient reported that she was in her home, and was napping.  She woke up when the smoke alarms in her home were going up.  After that she was confused and called 911.  She stated that prior to this she had attempting to start her lawnmower, and had started the lawnmower but placed something on the controls that would keep the engine running.  She stated that she had left it on the stoop of an opening to her basement which was connected to the outside.  She stated after she had started it she went into the home, got a glass of water, and then apparently went up to her bed and went to sleep.  She woke up when the alarms were going off, and then was taken by EMS.  She was initially reported to have a carboxyhemoglobin of 14, and there was concern for left-sided weakness as well as a facial droop.  She was placed on a nonrebreather oxygen mask.  A CT scan of her brain was apparently negative.  Her laboratories revealed a creatinine of 1.27, a carboxyhemoglobin of 8.9, and admit hemoglobin of 1.1.  Her drug screen revealed benzodiazepines, but she does have a prescription for that.  She subsequently underwent an MRI which was negative for any stroke.  She been placed under involuntary commitment for concern of a suicide attempt.  She admitted that there have been recent stress over a separation with her husband.  She had also had problems with a dog that was dying, and she was being limited  access to the dog because "my husband was the one who took the dog there".  She admitted that she had a longstanding history of depression for several years.  She has been treated with multiple medications in the past.  Most recently she was treated with fluoxetine, but decided to go off of it in January or February of this year.  She was most recently seen by Dr. Toy Care with whom she had had a telephone visit.  She had had 1 previous psychiatric hospitalization at our facility in 2011.  She had also been hospitalized in 2001 on a psychiatric unit.  Unfortunately those records are not available through the electronic medical record.  She stated that this was not a suicide attempt, but she does not remember exactly what has happened.  She has had very somatic neurological symptoms including sleep disturbance for many years.  Her neurological work-up is apparently negative, and has been on multiple medications for sleep disturbance.  She stated that previously on one of the psychiatric hospitalization she had been awake for approximately 2 weeks prior to that admission.  With regard to the potential for bipolar disorder she denied any episodes of euphoria, excessive spending, and other symptoms.  She denies depression currently.  She denies suicidal ideation.  She was admitted to the hospital for evaluation and stabilization.  Associated Signs/Symptoms: Depression Symptoms:  anhedonia, insomnia, psychomotor agitation, fatigue, suicidal attempt, anxiety, disturbed sleep, (Hypo) Manic Symptoms:  Distractibility, Anxiety Symptoms:  Excessive Worry, Psychotic  Symptoms:  Denied PTSD Symptoms: Negative Total Time spent with patient: 45 minutes  Past Psychiatric History: Patient has been seen by Dr.Kaur most recently.  She apparently had been psychiatrically hospitalized in 2011 and was also hospitalized in 2001.  Is the patient at risk to self? Yes.    Has the patient been a risk to self in the past 6  months? No.  Has the patient been a risk to self within the distant past? Yes.    Is the patient a risk to others? No.  Has the patient been a risk to others in the past 6 months? No.  Has the patient been a risk to others within the distant past? No.   Prior Inpatient Therapy:   Prior Outpatient Therapy:    Alcohol Screening: 1. How often do you have a drink containing alcohol?: Never 2. How many drinks containing alcohol do you have on a typical day when you are drinking?: 1 or 2 3. How often do you have six or more drinks on one occasion?: Never AUDIT-C Score: 0 4. How often during the last year have you found that you were not able to stop drinking once you had started?: Never 5. How often during the last year have you failed to do what was normally expected from you because of drinking?: Never 6. How often during the last year have you needed a first drink in the morning to get yourself going after a heavy drinking session?: Never 7. How often during the last year have you had a feeling of guilt of remorse after drinking?: Never 8. How often during the last year have you been unable to remember what happened the night before because you had been drinking?: Never 9. Have you or someone else been injured as a result of your drinking?: No 10. Has a relative or friend or a doctor or another health worker been concerned about your drinking or suggested you cut down?: No Alcohol Use Disorder Identification Test Final Score (AUDIT): 0 Substance Abuse History in the last 12 months:  No. Consequences of Substance Abuse: Negative Previous Psychotropic Medications: Yes  Psychological Evaluations: Yes  Past Medical History:  Past Medical History:  Diagnosis Date  . CKD (chronic kidney disease)   . Depression   . Facial droop   . Fibromyalgia   . Hyperlipidemia   . Hypertension   . Hypothyroid   . IBS (irritable bowel syndrome)   . Insomnia   . Lupus ([place])   . Migraine   . RLS  (restless legs syndrome)   . Skin cancer Had cancer removed in 2000  . Thyroid disease     Past Surgical History:  Procedure Laterality Date  . ABDOMINAL HYSTERECTOMY  2008  . APPENDECTOMY  1998  . thoracic tumor  03/1995   excision   Family History:  Family History  Problem Relation Age of Onset  . Sleep apnea Mother   . Irregular heart beat Mother        A fib  . Hypertension Mother   . Diabetes Mother   . Dementia Mother   . Pneumonia Father   . Sleep apnea Brother   . Dementia Maternal Grandmother    Family Psychiatric  History: Noncontributory Tobacco Screening:   Social History:  Social History   Substance and Sexual Activity  Alcohol Use No     Social History   Substance and Sexual Activity  Drug Use Not Currently  . Types: Marijuana, Other-see comments  Comment: CBD oil, last used 2020    Additional Social History:                           Allergies:   Allergies  Allergen Reactions  . Erythromycin Anaphylaxis    Chest pain  . Morphine And Related Nausea And Vomiting    Not given  . Promethazine Nausea And Vomiting    "made her sick"  . Simvastatin Other (See Comments)    Muscle aches   Lab Results:  Results for orders placed or performed during the hospital encounter of 08/26/19 (from the past 48 hour(s))  Lipid panel     Status: Abnormal   Collection Time: 08/27/19  7:50 PM  Result Value Ref Range   Cholesterol 240 (H) 0 - 200 mg/dL   Triglycerides 273 (H) <150 mg/dL   HDL 44 >40 mg/dL   Total CHOL/HDL Ratio 5.5 RATIO   VLDL 55 (H) 0 - 40 mg/dL   LDL Cholesterol 141 (H) 0 - 99 mg/dL    Comment:        Total Cholesterol/HDL:CHD Risk Coronary Heart Disease Risk Table                     Men   Women  1/2 Average Risk   3.4   3.3  Average Risk       5.0   4.4  2 X Average Risk   9.6   7.1  3 X Average Risk  23.4   11.0        Use the calculated Patient Ratio above and the CHD Risk Table to determine the patient's CHD  Risk.        ATP III CLASSIFICATION (LDL):  <100     mg/dL   Optimal  100-129  mg/dL   Near or Above                    Optimal  130-159  mg/dL   Borderline  160-189  mg/dL   High  >190     mg/dL   Very High Performed at [facility] [address], [place], [place] [address]     Blood Alcohol level:  Lab Results  Component Value Date   ETH <10 08/26/2019   [facility]  06/10/2009    <5        LOWEST DETECTABLE LIMIT FOR SERUM ALCOHOL IS 5 mg/dL FOR MEDICAL PURPOSES ONLY    Metabolic Disorder Labs:  No results found for: HGBA1C, MPG No results found for: PROLACTIN Lab Results  Component Value Date   CHOL 240 (H) 08/27/2019   TRIG 273 (H) 08/27/2019   HDL 44 08/27/2019   CHOLHDL 5.5 08/27/2019   VLDL 55 (H) 08/27/2019   LDLCALC 141 (H) 08/27/2019    Current Medications: Current Facility-Administered Medications  Medication Dose Route Frequency Provider Last Rate Last Admin  . acetaminophen (TYLENOL) tablet 650 mg  650 mg Oral Q6H PRN Sharma Covert, MD   650 mg at 08/28/19 1444  . alum & mag hydroxide-simeth (MAALOX/MYLANTA) 200-200-20 MG/5ML suspension 30 mL  30 mL Oral Q4H PRN Sharma Covert, MD      . fluconazole (DIFLUCAN) tablet 150 mg  150 mg Oral Weekly Lindell Spar I, NP   150 mg at 08/28/19 1754  . hydrOXYzine (ATARAX/VISTARIL) tablet 25 mg  25 mg Oral TID PRN Sharma Covert, MD   25 mg  at 08/27/19 2353  . levothyroxine (SYNTHROID) tablet 100 mcg  100 mcg Oral Q0600 Sharma Covert, MD   100 mcg at 08/28/19 [phone]  . LORazepam (ATIVAN) tablet 1 mg  1 mg Oral BID Sharma Covert, MD   1 mg at 08/28/19 1647  . risperiDONE (RISPERDAL M-TABS) disintegrating tablet 2 mg  2 mg Oral Q8H PRN Sharma Covert, MD       And  . LORazepam (ATIVAN) tablet 1 mg  1 mg Oral PRN Sharma Covert, MD       And  . ziprasidone (GEODON) injection 20 mg  20 mg Intramuscular PRN Sharma Covert, MD      . magnesium hydroxide (MILK OF MAGNESIA) suspension 30  mL  30 mL Oral Daily PRN Sharma Covert, MD      . Derrill Memo ON 08/29/2019] OLANZapine zydis (ZYPREXA) disintegrating tablet 10 mg  10 mg Oral QHS Sharma Covert, MD      . pantoprazole (PROTONIX) EC tablet 40 mg  40 mg Oral Daily Sharma Covert, MD   40 mg at 08/28/19 5329  . zolpidem (AMBIEN) tablet 5 mg  5 mg Oral QHS PRN Sharma Covert, MD   5 mg at 08/27/19 2158   PTA Medications: Medications Prior to Admission  Medication Sig Dispense Refill Last Dose  . acetaminophen (TYLENOL) 325 MG tablet Take 325 mg by mouth 2 (two) times daily as needed for mild pain.     Marland Kitchen azelastine (ASTELIN) 0.1 % nasal spray Place 2 sprays into both nostrils 2 (two) times daily.     Marland Kitchen docusate sodium (COLACE) 100 MG capsule Take 1 capsule (100 mg total) by mouth daily. 30 capsule 0   . doxepin (SINEQUAN) 25 MG capsule Take 25-50 mg by mouth at bedtime.      . gabapentin (NEURONTIN) 100 MG capsule Take 1 capsule (100 mg total) by mouth 2 (two) times daily for 14 days. 28 capsule 0   . levothyroxine (SYNTHROID) 100 MCG tablet Take 100 mcg by mouth daily.     Marland Kitchen LORazepam (ATIVAN) 1 MG tablet Take 1 mg by mouth 3 (three) times daily as needed for anxiety or sleep.      . Multiple Vitamin (MULTIVITAMIN) capsule Take 1 capsule by mouth daily.     Marland Kitchen omeprazole (PRILOSEC) 20 MG capsule Take 20 mg by mouth daily as needed (for acid reflux).      . polyethylene glycol (MIRALAX) 17 g packet Take 17 g by mouth daily as needed for mild constipation. 14 each 0   . pravastatin (PRAVACHOL) 10 MG tablet Take 10 mg by mouth daily.     . Probiotic CAPS Take 1 capsule by mouth daily. 30 capsule 1   . tiZANidine (ZANAFLEX) 4 MG tablet Take 4 mg by mouth at bedtime as needed for muscle spasms.      Marland Kitchen zolpidem (AMBIEN CR) 12.5 MG CR tablet Take 12.5 mg by mouth at bedtime as needed for sleep.       Musculoskeletal: Strength & Muscle Tone: within normal limits Gait & Station: normal Patient leans: N/A  Psychiatric  Specialty Exam: Physical Exam Vitals and nursing note reviewed.  Constitutional:      Appearance: Normal appearance.  HENT:     Head: Normocephalic and atraumatic.  Pulmonary:     Effort: Pulmonary effort is normal.  Neurological:     General: No focal deficit present.     Mental Status: She is alert  and oriented to person, place, and time.     Review of Systems  Blood pressure 122/90, pulse 89, temperature 98.2 F (36.8 C), temperature source Oral, resp. rate 18, height 5\' 8"  (1.727 m), weight 81.6 kg, SpO2 95 %.Body mass index is 27.37 kg/m.  General Appearance: Casual  Eye Contact:  Good  Speech:  Normal Rate  Volume:  Normal  Mood:  Euthymic  Affect:  Congruent  Thought Process:  Coherent and Descriptions of Associations: Intact  Orientation:  Full (Time, Place, and Person)  Thought Content:  Logical  Suicidal Thoughts:  No  Homicidal Thoughts:  No  Memory:  Immediate;   Fair Recent;   Fair Remote;   Fair  Judgement:  Intact  Insight:  Fair  Psychomotor Activity:  Normal  Concentration:  Concentration: Fair and Attention Span: Fair  Recall:  [employer] of Knowledge:  Fair  Language:  Fair  Akathisia:  Negative  Handed:  Right  AIMS (if indicated):     Assets:  Desire for Improvement Resilience  ADL's:  Intact  Cognition:  WNL  Sleep:  Number of Hours: 4.75    Treatment Plan Summary: Daily contact with patient to assess and evaluate symptoms and progress in treatment, Medication management and Plan : Patient is seen and examined.  Patient is a 59 year old female with the above-stated past psychiatric history who was admitted secondary to concern for possible suicide attempt by carbon monoxide poisoning.  She will be admitted to the hospital.  She will be integrated in the milieu.  She will be encouraged to attend groups.  She stated that she is not actively taking any antidepressant medicine.  She has been prescribed Ambien and Seroquel for sleep from Dr. Toy Care.   The dosage of Seroquel she reports is 25 mg and she is able to take 2 to 3 tablets at at bedtime.  She is also prescribed lorazepam 1 mg p.o. twice daily and that will be continued.  She was started on Zyprexa 10 mg p.o. daily, and that is been switched to nightly on admission.  We will continue the Ambien on a as needed basis.  We will collect collateral information from her family with regard to her safety.  Of her admission laboratories revealed a blood sugar of 131 on the morning of 7/14.  Her last electrolyte panel was from 7/14.  Her creatinine was mildly elevated 1.2, and her AST was elevated at 61.  The rest of her electrolytes were essentially normal.  Her total cholesterol was elevated at 240 and her triglycerides were elevated to 73.  Her CBC was normal including her platelets.  PT and INR were normal.  Her acetaminophen was less than 10, salicylate was less than 7.  Her TSH was 0.383.  She does have a history of hypothyroidism and is on levothyroxine.  HIV was negative.  Drug screen on admission was positive for benzodiazepines.  Hopefully we can clarify some of these things during the course of the hospitalization.  Observation Level/Precautions:  15 minute checks  Laboratory:  Chemistry Profile  Psychotherapy:    Medications:    Consultations:    Discharge Concerns:    Estimated LOS:  Other:     Physician Treatment Plan for Primary Diagnosis: <principal problem not specified> Long Term Goal(s): Improvement in symptoms so as ready for discharge  Short Term Goals: Ability to identify changes in lifestyle to reduce recurrence of condition will improve, Ability to verbalize feelings will improve, Ability to disclose  and discuss suicidal ideas, Ability to demonstrate self-control will improve, Ability to identify and develop effective coping behaviors will improve and Ability to maintain clinical measurements within normal limits will improve  Physician Treatment Plan for Secondary  Diagnosis: Active Problems:   Altered mental status  Long Term Goal(s): Improvement in symptoms so as ready for discharge  Short Term Goals: Ability to identify changes in lifestyle to reduce recurrence of condition will improve, Ability to verbalize feelings will improve, Ability to disclose and discuss suicidal ideas, Ability to demonstrate self-control will improve, Ability to identify and develop effective coping behaviors will improve and Ability to maintain clinical measurements within normal limits will improve  I certify that inpatient services furnished can reasonably be expected to improve the patient's condition.    Sharma Covert, MD 7/16/20216:13 PM

## 2019-08-28 NOTE — [facility] Suicide Risk Assessment (Signed)
[facility] Admission Suicide Risk Assessment   Nursing information obtained from:  Patient Demographic factors:  Unemployed, Low socioeconomic status, Caucasian, Living alone Current Mental Status:  Suicidal ideation indicated by others Loss Factors:  Decline in physical health Historical Factors:  Prior suicide attempts, Family history of mental illness or substance abuse, Impulsivity Risk Reduction Factors:  Sense of responsibility to family, Positive social support, Positive coping skills or problem solving skills, Positive therapeutic relationship  Total Time spent with patient: 45 minutes Principal Problem: <principal problem not specified> Diagnosis:  Active Problems:   Altered mental status  Subjective Data: Patient is seen and examined.  Patient is a 59 year old female with a past psychiatric history significant for depression who presented to the [facility] emergency department on 08/26/2019.  The patient reported that she was in her home, and was napping.  She woke up when the smoke alarms in her home were going up.  After that she was confused and called 911.  She stated that prior to this she had attempting to start her lawnmower, and had started the lawnmower but placed something on the controls that would keep the engine running.  She stated that she had left it on the stoop of an opening to her basement which was connected to the outside.  She stated after she had started it she went into the home, got a glass of water, and then apparently went up to her bed and went to sleep.  She woke up when the alarms were going off, and then was taken by EMS.  She was initially reported to have a carboxyhemoglobin of 14, and there was concern for left-sided weakness as well as a facial droop.  She was placed on a nonrebreather oxygen mask.  A CT scan of her brain was apparently negative.  Her laboratories revealed a creatinine of 1.27, a carboxyhemoglobin of 8.9, and admit hemoglobin of  1.1.  Her drug screen revealed benzodiazepines, but she does have a prescription for that.  She subsequently underwent an MRI which was negative for any stroke.  She been placed under involuntary commitment for concern of a suicide attempt.  She admitted that there have been recent stress over a separation with her husband.  She had also had problems with a dog that was dying, and she was being limited access to the dog because "my husband was the one who took the dog there".  She admitted that she had a longstanding history of depression for several years.  She has been treated with multiple medications in the past.  Most recently she was treated with fluoxetine, but decided to go off of it in January or February of this year.  She was most recently seen by Dr. Toy Care with whom she had had a telephone visit.  She had had 1 previous psychiatric hospitalization at our facility in 2011.  She had also been hospitalized in 2001 on a psychiatric unit.  Unfortunately those records are not available through the electronic medical record.  She stated that this was not a suicide attempt, but she does not remember exactly what has happened.  She has had very somatic neurological symptoms including sleep disturbance for many years.  Her neurological work-up is apparently negative, and has been on multiple medications for sleep disturbance.  She stated that previously on one of the psychiatric hospitalization she had been awake for approximately 2 weeks prior to that admission.  With regard to the potential for bipolar disorder she denied  any episodes of euphoria, excessive spending, and other symptoms.  She denies depression currently.  She denies suicidal ideation.  She was admitted to the hospital for evaluation and stabilization.  Continued Clinical Symptoms:  Alcohol Use Disorder Identification Test Final Score (AUDIT): 0 The "Alcohol Use Disorders Identification Test", Guidelines for Use in Primary Care, Second Edition.   World Pharmacologist [facility]). Score between 0-7:  no or low risk or alcohol related problems. Score between 8-15:  moderate risk of alcohol related problems. Score between 16-19:  high risk of alcohol related problems. Score 20 or above:  warrants further diagnostic evaluation for alcohol dependence and treatment.   CLINICAL FACTORS:   Depression:   Insomnia   Musculoskeletal: Strength & Muscle Tone: within normal limits Gait & Station: normal Patient leans: N/A  Psychiatric Specialty Exam: Physical Exam Vitals and nursing note reviewed.  Constitutional:      Appearance: Normal appearance.  HENT:     Head: Normocephalic and atraumatic.  Pulmonary:     Effort: Pulmonary effort is normal.  Neurological:     General: No focal deficit present.     Mental Status: She is alert and oriented to person, place, and time.     Review of Systems  Blood pressure 122/90, pulse 89, temperature 98.2 F (36.8 C), temperature source Oral, resp. rate 18, height 5\' 8"  (1.727 m), weight 81.6 kg, SpO2 95 %.Body mass index is 27.37 kg/m.  General Appearance: Casual  Eye Contact:  Good  Speech:  Normal Rate  Volume:  Normal  Mood:  Euthymic  Affect:  Congruent  Thought Process:  Coherent and Descriptions of Associations: Circumstantial  Orientation:  Full (Time, Place, and Person)  Thought Content:  Logical  Suicidal Thoughts:  No  Homicidal Thoughts:  No  Memory:  Immediate;   Fair Recent;   Fair Remote;   Fair  Judgement:  Intact  Insight:  Fair  Psychomotor Activity:  Normal  Concentration:  Concentration: Good and Attention Span: Good  Recall:  Good  Fund of Knowledge:  Good  Language:  Good  Akathisia:  Negative  Handed:  Right  AIMS (if indicated):     Assets:  Desire for Improvement Resilience  ADL's:  Intact  Cognition:  WNL  Sleep:  Number of Hours: 4.75      COGNITIVE FEATURES THAT CONTRIBUTE TO RISK:  None    SUICIDE RISK:   Moderate:  Frequent suicidal  ideation with limited intensity, and duration, some specificity in terms of plans, no associated intent, good self-control, limited dysphoria/symptomatology, some risk factors present, and identifiable protective factors, including available and accessible social support.  PLAN OF CARE: Patient is seen and examined.  Patient is a 59 year old female with the above-stated past psychiatric history who was admitted secondary to concern for possible suicide attempt by carbon monoxide poisoning.  She will be admitted to the hospital.  She will be integrated in the milieu.  She will be encouraged to attend groups.  She stated that she is not actively taking any antidepressant medicine.  She has been prescribed Ambien and Seroquel for sleep from Dr. Toy Care.  The dosage of Seroquel she reports is 25 mg and she is able to take 2 to 3 tablets at at bedtime.  She is also prescribed lorazepam 1 mg p.o. twice daily and that will be continued.  She was started on Zyprexa 10 mg p.o. daily, and that is been switched to nightly on admission.  We will continue the Ambien on  a as needed basis.  We will collect collateral information from her family with regard to her safety.  Of her admission laboratories revealed a blood sugar of 131 on the morning of 7/14.  Her last electrolyte panel was from 7/14.  Her creatinine was mildly elevated 1.2, and her AST was elevated at 61.  The rest of her electrolytes were essentially normal.  Her total cholesterol was elevated at 240 and her triglycerides were elevated to 73.  Her CBC was normal including her platelets.  PT and INR were normal.  Her acetaminophen was less than 10, salicylate was less than 7.  Her TSH was 0.383.  She does have a history of hypothyroidism and is on levothyroxine.  HIV was negative.  Drug screen on admission was positive for benzodiazepines.  Hopefully we can clarify some of these things during the course of the hospitalization.  I certify that inpatient services  furnished can reasonably be expected to improve the patient's condition.   Sharma Covert, MD 08/28/2019, 11:52 AM

## 2019-08-28 NOTE — Progress Notes (Signed)
   08/27/19 2200  Psych Admission Type (Psych Patients Only)  Admission Status Involuntary  Psychosocial Assessment  Patient Complaints Anxiety  Eye Contact Fair  Facial Expression Sad  Affect Anxious  Speech Soft  Interaction Cautious  Motor Activity Slow  Appearance/Hygiene Disheveled  Behavior Characteristics Cooperative  Mood Anxious  Aggressive Behavior  Effect No apparent injury  Thought Process  Coherency Circumstantial  Content WDL  Delusions WDL  Perception WDL  Hallucination None reported or observed  Judgment Poor  Confusion WDL  Danger to Self  Current suicidal ideation? Denies  Danger to Others  Danger to Others None reported or observed

## 2019-08-28 NOTE — Progress Notes (Signed)
Pt transferred to [address] hall today and appears to be much more comfortable interacting with roommate and other patients on the unit.  Pt denies SI/HI/AVH.  RN established rapport with pt and assessed for needs/concerns.  RN administered medications per MD orders. No adverse reactions were noted.  Pt is calm at this time.  Q 15 min safety checks remain in place.

## 2019-08-28 NOTE — H&P (Signed)
Psychiatric Admission Assessment Adult  Patient Identification: Kathleen Sweeney MRN:  [id number] Date of Evaluation:  08/28/2019 Chief Complaint:  Altered mental status [R41.82] "Acci Principal Diagnosis: <principal problem not specified> Diagnosis:  Active Problems:   Altered mental status  History of Present Illness: Kathleen Schermerhorn Bishopis a 59 y.o.femalewith past psychiatric history of depression, anxiety, fibromyalgia and medical history significant ofhypertension, hyperlipidemia, hypothyroidism, lupus, RLS,andhypothyroidismpresented to the hospital after calling 911 working up to alarms going off.  She statesshe had taken the lawn mowerout of the shed and had had a difficult time trying to get it started. When she was able to get it started as she tied her mask around the throttle to keep it running. She reports that she kept the mower running outside of the basement and had gone back into the house to change shoes that she had high heals on at the time. She states she got watervto drink and then somehow went upstairs and fell asleep. She woke up toalarms going off and she was unable to figure out why they were going off and call 911. She reports that she had no thoughts of wanting to hurt her self, but does have a difficult time with insomnia. Her and her husband have been separated and he is currently staying at an friend's house, but also took their dog. She had been in disagreement with her husband over the dog who is currently old and dying.Patient had sent text messages to her son in regards to this situation whom she rarely talks to and at the end of the text message saidI willalways loveyou. Review of text messages to her other son also notethe same phrase and she notes that that did not mean that she wanted to kill her self.  Collateral from Sister: Patient gave her verbal and written consent to talk to her sister. Spent 24 minutes on phone conversation. Sister states  she thinks Kathleen Sweeney needs help. She states she thinks this was a suicide attempt. She reports past 3 suicide attempts 10 years ago. She adds the patient has chronic insomnia and she actually sleeps very little and patient going upstairs to change shoes and taking a nap does not make any sense to her. She states the patient is going to be really manipulative to get out of the hospital and will outwit all doctors and nurses.  Patient is alert, anxious and tearful today. She denies any suicidal ideation,homicidal ideation or any AVH. She does not admit the attempt to be intentional rather claiming it to be accident. She states she is not depressed and wants to go home.She complains of insomnia from long time and is seeing Dr. Toy Care. She states she took Prozac foe her depression for around 15 years but stopped it in January 2021 along with her hormonal replacement therapy. She was tearful during whole interview and cried couple of times wanting to go home and that how she does not belong here.  ED Course:Upon arrival into the emergency department patient was noted lethargic. She was initially on a nonrebreather oxygen. A code stroke had been called for concern of left-sided weakness. CT scan of the brain negative for any acute abnormalities. Labs were significant for creatinine 1.27 carboxyhemoglobin 8.9, andmethemoglobin 1.1.Urine drug screen was positive for benzodiazepines which are on the patient's prescription list. Subsequently MRI was also noted to be negative for any sign of a stroke. Patient had been IVC for concern for suicidal ideation. Repeat carboxyhemoglobin levels be within normal limits and patient  started to wake up.  Hospital Course   Carbon-monoxide poisoning -Carboxyhemoglobin level elevated initially on admission at 8.9, but this has normalized on recheck at 1.2. -on admission it was unclear if it was intentional versus unintentional, so she was involuntary committed,psychiatry  has been consulted, and it does appear from their assessment it was a suicide attempt, please see Psych consult note in details recommend psych admission, was accepted to behavioral health and she has a bed available.  Left-sided weakness: -This has resolved,Suspect symptoms secondary to above as CT and MRI of the brain negative for any acute abnormalities. Echocardiograms revealed EF of 50-60%. Neurology has reevaluated and recommended no further work-up.  Anxiety: -Home medications include Ativan 1 mg 3 times daily as needed foranxiety   Hypothyroidism:  -EXH3.716 -Continue levothyroxine  Hyperlipidemia -Continue pravastatin Associated Signs/Symptoms: Depression Symptoms:  depressed mood, insomnia, anxiety, (Hypo) Manic Symptoms:  NA Anxiety Symptoms:  NA Psychotic Symptoms:  NA PTSD Symptoms: NA Total Time spent with patient: 1 hour  Past Psychiatric History: She has past psychiatric history of depression, fibromyalgia, anxiety and reported 3 suicide attempts 10 years ago.  Is the patient at risk to self? No.  Has the patient been a risk to self in the past 6 months? No.  Has the patient been a risk to self within the distant past? Yes.    Is the patient a risk to others? No.  Has the patient been a risk to others in the past 6 months? No.  Has the patient been a risk to others within the distant past? No.   Prior Inpatient Therapy:   Prior Outpatient Therapy:    Alcohol Screening: 1. How often do you have a drink containing alcohol?: Never 2. How many drinks containing alcohol do you have on a typical day when you are drinking?: 1 or 2 3. How often do you have six or more drinks on one occasion?: Never AUDIT-C Score: 0 4. How often during the last year have you found that you were not able to stop drinking once you had started?: Never 5. How often during the last year have you failed to do what was normally expected from you because of drinking?: Never 6.  How often during the last year have you needed a first drink in the morning to get yourself going after a heavy drinking session?: Never 7. How often during the last year have you had a feeling of guilt of remorse after drinking?: Never 8. How often during the last year have you been unable to remember what happened the night before because you had been drinking?: Never 9. Have you or someone else been injured as a result of your drinking?: No 10. Has a relative or friend or a doctor or another health worker been concerned about your drinking or suggested you cut down?: No Alcohol Use Disorder Identification Test Final Score (AUDIT): 0 Substance Abuse History in the last 12 months:  No. Consequences of Substance Abuse: NA Previous Psychotropic Medications: Yes  Psychological Evaluations: Yes  Past Medical History:  Past Medical History:  Diagnosis Date  . CKD (chronic kidney disease)   . Depression   . Facial droop   . Fibromyalgia   . Hyperlipidemia   . Hypertension   . Hypothyroid   . IBS (irritable bowel syndrome)   . Insomnia   . Lupus ([place])   . Migraine   . RLS (restless legs syndrome)   . Skin cancer Had cancer removed in 2000  .  Thyroid disease     Past Surgical History:  Procedure Laterality Date  . ABDOMINAL HYSTERECTOMY  2008  . APPENDECTOMY  1998  . thoracic tumor  03/1995   excision   Family History:  Family History  Problem Relation Age of Onset  . Sleep apnea Mother   . Irregular heart beat Mother        A fib  . Hypertension Mother   . Diabetes Mother   . Dementia Mother   . Pneumonia Father   . Sleep apnea Brother   . Dementia Maternal Grandmother    Family Psychiatric  History: Not pertinent Tobacco Screening:   Social History:  Social History   Substance and Sexual Activity  Alcohol Use No     Social History   Substance and Sexual Activity  Drug Use Not Currently  . Types: Marijuana, Other-see comments   Comment: CBD oil, last used 2020     Additional Social History:                           Allergies:   Allergies  Allergen Reactions  . Erythromycin Anaphylaxis    Chest pain  . Morphine And Related Nausea And Vomiting    Not given  . Promethazine Nausea And Vomiting    "made her sick"  . Simvastatin Other (See Comments)    Muscle aches   Lab Results:  Results for orders placed or performed during the hospital encounter of 08/26/19 (from the past 48 hour(s))  Lipid panel     Status: Abnormal   Collection Time: 08/27/19  7:50 PM  Result Value Ref Range   Cholesterol 240 (H) 0 - 200 mg/dL   Triglycerides 273 (H) <150 mg/dL   HDL 44 >40 mg/dL   Total CHOL/HDL Ratio 5.5 RATIO   VLDL 55 (H) 0 - 40 mg/dL   LDL Cholesterol 141 (H) 0 - 99 mg/dL    Comment:        Total Cholesterol/HDL:CHD Risk Coronary Heart Disease Risk Table                     Men   Women  1/2 Average Risk   3.4   3.3  Average Risk       5.0   4.4  2 X Average Risk   9.6   7.1  3 X Average Risk  23.4   11.0        Use the calculated Patient Ratio above and the CHD Risk Table to determine the patient's CHD Risk.        ATP III CLASSIFICATION (LDL):  <100     mg/dL   Optimal  100-129  mg/dL   Near or Above                    Optimal  130-159  mg/dL   Borderline  160-189  mg/dL   High  >190     mg/dL   Very High Performed at [facility] [address], [place], [place] [address]     Blood Alcohol level:  Lab Results  Component Value Date   ETH <10 08/26/2019   [facility]  06/10/2009    <5        LOWEST DETECTABLE LIMIT FOR SERUM ALCOHOL IS 5 mg/dL FOR MEDICAL PURPOSES ONLY    Metabolic Disorder Labs:  No results found for: HGBA1C, MPG No results found for: PROLACTIN Lab  Results  Component Value Date   CHOL 240 (H) 08/27/2019   TRIG 273 (H) 08/27/2019   HDL 44 08/27/2019   CHOLHDL 5.5 08/27/2019   VLDL 55 (H) 08/27/2019   LDLCALC 141 (H) 08/27/2019    Current Medications: Current Facility-Administered  Medications  Medication Dose Route Frequency Provider Last Rate Last Admin  . acetaminophen (TYLENOL) tablet 650 mg  650 mg Oral Q6H PRN Sharma Covert, MD   650 mg at 08/28/19 1444  . alum & mag hydroxide-simeth (MAALOX/MYLANTA) 200-200-20 MG/5ML suspension 30 mL  30 mL Oral Q4H PRN Sharma Covert, MD      . fluconazole (DIFLUCAN) tablet 150 mg  150 mg Oral Weekly Lindell Spar I, NP   150 mg at 08/28/19 1754  . hydrOXYzine (ATARAX/VISTARIL) tablet 25 mg  25 mg Oral TID PRN Sharma Covert, MD   25 mg at 08/27/19 2353  . [START ON 08/29/2019] levothyroxine (SYNTHROID) tablet 75 mcg  75 mcg Oral Q0600 Sharma Covert, MD      . LORazepam (ATIVAN) tablet 1 mg  1 mg Oral BID Sharma Covert, MD   1 mg at 08/28/19 1647  . risperiDONE (RISPERDAL M-TABS) disintegrating tablet 2 mg  2 mg Oral Q8H PRN Sharma Covert, MD       And  . LORazepam (ATIVAN) tablet 1 mg  1 mg Oral PRN Sharma Covert, MD       And  . ziprasidone (GEODON) injection 20 mg  20 mg Intramuscular PRN Sharma Covert, MD      . magnesium hydroxide (MILK OF MAGNESIA) suspension 30 mL  30 mL Oral Daily PRN Sharma Covert, MD      . Derrill Memo ON 08/29/2019] OLANZapine zydis (ZYPREXA) disintegrating tablet 15 mg  15 mg Oral QHS Sharma Covert, MD      . pantoprazole (PROTONIX) EC tablet 40 mg  40 mg Oral Daily Sharma Covert, MD   40 mg at 08/28/19 1610  . zolpidem (AMBIEN) tablet 5 mg  5 mg Oral QHS PRN Sharma Covert, MD   5 mg at 08/27/19 2158   PTA Medications: Medications Prior to Admission  Medication Sig Dispense Refill Last Dose  . acetaminophen (TYLENOL) 325 MG tablet Take 325 mg by mouth 2 (two) times daily as needed for mild pain.     Marland Kitchen azelastine (ASTELIN) 0.1 % nasal spray Place 2 sprays into both nostrils 2 (two) times daily.     Marland Kitchen docusate sodium (COLACE) 100 MG capsule Take 1 capsule (100 mg total) by mouth daily. 30 capsule 0   . doxepin (SINEQUAN) 25 MG capsule Take 25-50 mg by  mouth at bedtime.      . gabapentin (NEURONTIN) 100 MG capsule Take 1 capsule (100 mg total) by mouth 2 (two) times daily for 14 days. 28 capsule 0   . levothyroxine (SYNTHROID) 100 MCG tablet Take 100 mcg by mouth daily.     Marland Kitchen LORazepam (ATIVAN) 1 MG tablet Take 1 mg by mouth 3 (three) times daily as needed for anxiety or sleep.      . Multiple Vitamin (MULTIVITAMIN) capsule Take 1 capsule by mouth daily.     Marland Kitchen omeprazole (PRILOSEC) 20 MG capsule Take 20 mg by mouth daily as needed (for acid reflux).      . polyethylene glycol (MIRALAX) 17 g packet Take 17 g by mouth daily as needed for mild constipation. 14 each 0   . pravastatin (PRAVACHOL) 10  MG tablet Take 10 mg by mouth daily.     . Probiotic CAPS Take 1 capsule by mouth daily. 30 capsule 1   . tiZANidine (ZANAFLEX) 4 MG tablet Take 4 mg by mouth at bedtime as needed for muscle spasms.      Marland Kitchen zolpidem (AMBIEN CR) 12.5 MG CR tablet Take 12.5 mg by mouth at bedtime as needed for sleep.       Musculoskeletal: Strength & Muscle Tone: within normal limits Gait & Station: normal Patient leans: N/A  Psychiatric Specialty Exam: Physical Exam Musculoskeletal:     Cervical back: Normal range of motion.  Neurological:     Mental Status: She is alert and oriented to person, place, and time.     Review of Systems  Blood pressure 122/90, pulse 89, temperature 98.2 F (36.8 C), temperature source Oral, resp. rate 18, height 5\' 8"  (1.727 m), weight 81.6 kg, SpO2 95 %.Body mass index is 27.37 kg/m.  General Appearance: Casual  Eye Contact:  Good  Speech:  Normal Rate  Volume:  Normal  Mood:  Anxious  Affect:  Labile  Thought Process:  Goal Directed  Orientation:  Full (Time, Place, and Person)  Thought Content:  WDL  Suicidal Thoughts:  No  Homicidal Thoughts:  No  Memory:  Immediate;   Good Recent;   Good  Judgement:  Fair  Insight:  Fair  Psychomotor Activity:  Normal  Concentration:  Concentration: Good and Attention Span: Good   Recall:  Good  Fund of Knowledge:  Good  Language:  Good  Akathisia:  No  Handed:  Right  AIMS (if indicated):     Assets:  Communication Skills Desire for Improvement Resilience Social Support  ADL's:  Intact  Cognition:  WNL  Sleep:  Number of Hours: 4.75    Treatment Plan Summary: Daily contact with patient to assess and evaluate symptoms and progress in treatment.  Plan:  1. Continue Hydroxyzine 25 mg PO TID PRN for anxiety. 2.Start Lorazepam 1 mg Po BID. 3. Continue Zolpidem 5 mg PO QHS PRN 4. Start Zyprexa 15 mg QHS tomorrow for depression. 5. Start Fluconazole 150 mg PO weekly tomorrow.  Observation Level/Precautions:  Continuous Observation  Laboratory:  NA  Psychotherapy:    Medications:    Consultations:    Discharge Concerns:    Estimated LOS:  Other:     Physician Treatment Plan for Primary Diagnosis: <principal problem not specified> Long Term Goal(s): Improvement in symptoms so as ready for discharge  Short Term Goals: Ability to identify changes in lifestyle to reduce recurrence of condition will improve, Ability to verbalize feelings will improve, Ability to disclose and discuss suicidal ideas, Ability to identify and develop effective coping behaviors will improve and Ability to maintain clinical measurements within normal limits will improve  Physician Treatment Plan for Secondary Diagnosis: Active Problems:   Altered mental status  Long Term Goal(s): Improvement in symptoms so as ready for discharge  Short Term Goals: Ability to identify changes in lifestyle to reduce recurrence of condition will improve, Ability to verbalize feelings will improve, Ability to disclose and discuss suicidal ideas, Ability to demonstrate self-control will improve, Ability to identify and develop effective coping behaviors will improve, Ability to maintain clinical measurements within normal limits will improve and Compliance with prescribed medications will improve  I  certify that inpatient services furnished can reasonably be expected to improve the patient's condition.    Honor Junes, MD 7/16/20216:29 PM

## 2019-08-28 NOTE — Discharge Summary (Signed)
Kathleen Sweeney, is a 59 y.o. female  DOB [birth year]  MRN [id number].  Admission date:  08/26/2019  Admitting Physician  Norval Morton, MD  Discharge Date:  08/28/2019   Primary MD  Maurice Small, MD  Recommendations for primary care physician for things to follow:  - mangment per [facility]   Admission Diagnosis  Carbon monoxide poisoning [T58.91XA]   Discharge Diagnosis  Carbon monoxide poisoning [T58.91XA]    Active Problems:   Hypothyroid   Hyperlipidemia   Carbon monoxide poisoning   Anxiety   Left-sided weakness      Past Medical History:  Diagnosis Date  . CKD (chronic kidney disease)   . Depression   . Facial droop   . Fibromyalgia   . Hyperlipidemia   . Hypertension   . Hypothyroid   . IBS (irritable bowel syndrome)   . Insomnia   . Lupus ([place])   . Migraine   . RLS (restless legs syndrome)   . Skin cancer Had cancer removed in 2000  . Thyroid disease     Past Surgical History:  Procedure Laterality Date  . ABDOMINAL HYSTERECTOMY  2008  . APPENDECTOMY  1998  . thoracic tumor  03/1995   excision       History of present illness and  Hospital Course:     Kindly see H&P for history of present illness and admission details, please review complete Labs, Consult reports and Test reports for all details in brief  HPI  from the history and physical done on the day of admission 08/27/2019  Kathleen M Bishopis a 59 y.o.femalewith medical history significant ofhypertension, hyperlipidemia, hypothyroidism, lupus, RLS,andhypothyroidismpresented to the hospital after calling 911 working up to alarms going off. History is able to be obtained from patient now that she is more alert and awake. She had taken the lawn mowerout of the shed and had had a difficult time trying to get it started. When she was able to get it started as she tied her mask around the throttle to keep it  running. She reports that she kept tomorrow morning outside of the basement and had gone back into the house to change shoes that she had flip-flops on at the time. She thinks that she may have gotten something to drink and then somehow went upstairs and fell asleep somehow. She awoke up toalarms going off and she was unable to figure out why they were going off and call 911. She reports that she had no thoughts of wanting to hurt her self, but does have a difficult time with insomnia. Her and her husband have been separated and he is currently staying at an friend's house, but also took their dog. She had been in disagreement with her husband over the dog who is currently old and dying.Patient had sent text messages to her son in regards to this situation whom she rarely talks to and at the of the text message saidI willalways loveyou. Review of text messages to her other son also notethe same  phrase and she notes that that did not mean that she wanted to kill her self.  En route withEMS patient was initially reported to have a carboxyhemoglobin level of 14 and there was concern for left-sided weaknessand facial droop.  ED Course:Upon arrival into the emergency department patient was noted lethargic. She was initially on a nonrebreather oxygen. A code stroke had been called for concern of left-sided weakness. CT scan of the brain negative for any acute abnormalities. Labs were significant for creatinine 1.27 carboxyhemoglobin 8.9, andmethemoglobin 1.1.Urine drug screen was positive for benzodiazepines which are on the patient's prescription list. Subsequently MRI was also noted to be negative for any sign of a stroke. Patient had been IVC for concern for suicidal ideation. Repeat carboxyhemoglobin levels be within normal limits and patient started to wake up.  Hospital Course   Carbon-monoxide poisoning -Carboxyhemoglobin level elevated initially on admission at 8.9, but this  has normalized on recheck at 1.2. -on admission it was unclear if it was intentional versus unintentional, so she was involuntary committed, psychiatry has been consulted, and it does appear from their assessment it was a suicide attempt, please see Psych consult note in details recommend psych admission, was accepted to behavioral health and she has a bed available.  Left-sided weakness: -This  has resolved,Suspect symptoms secondary to above as CT and MRI of the brain negative for any acute abnormalities. Echocardiograms revealed EF of 50-60%. Neurology has reevaluated and recommended no further work-up.  Anxiety:  - Home medications include Ativan 1 mg 3 times daily as needed foranxiety   Hypothyroidism:  -TSH 0.383 -Continue levothyroxine  Hyperlipidemia -Continue pravastatin     Discharge Condition:  DC to [facility]   Follow UP   Follow-up Information    [name], [name], MD. Schedule an appointment as soon as possible for a visit in 2 week(s).   Specialty: Neurology Contact information: [address] [place] [address] [phone]        [name], MD. Schedule an appointment as soon as possible for a visit in 4 week(s).   Specialties: Neurology, Radiology Contact information: [address] [place] [address] [phone]                 Discharge Instructions  and  Discharge Medications     Discharge Instructions    Ambulatory referral to Neurology   Complete by: As directed    Follow-up with [name] in 4 weeks.   Ambulatory referral to Neurology   Complete by: As directed    Follow-up with [name] in 2 weeks.     Allergies as of 08/27/2019      Reactions   Erythromycin Anaphylaxis   Chest pain   Morphine And Related Nausea And Vomiting   Not given   Promethazine Nausea And Vomiting   "made her sick"   Simvastatin Other (See Comments)   Muscle aches      Medication List    ASK  your doctor about these medications   acetaminophen 325 MG tablet Commonly known as: TYLENOL Take 325 mg by mouth 2 (two) times daily as needed for mild pain.   azelastine 0.1 % nasal spray Commonly known as: ASTELIN Place 2 sprays into both nostrils 2 (two) times daily.   docusate sodium 100 MG capsule Commonly known as: Colace Take 1 capsule (100 mg total) by mouth daily.   doxepin 25 MG capsule Commonly known as: SINEQUAN Take 25-50 mg by mouth at bedtime.  gabapentin 100 MG capsule Commonly known as: Neurontin Take 1 capsule (100 mg total) by mouth 2 (two) times daily for 14 days.   levothyroxine 100 MCG tablet Commonly known as: SYNTHROID Take 100 mcg by mouth daily.   LORazepam 1 MG tablet Commonly known as: ATIVAN Take 1 mg by mouth 3 (three) times daily as needed for anxiety or sleep.   multivitamin capsule Take 1 capsule by mouth daily.   omeprazole 20 MG capsule Commonly known as: PRILOSEC Take 20 mg by mouth daily as needed (for acid reflux).   polyethylene glycol 17 g packet Commonly known as: MiraLax Take 17 g by mouth daily as needed for mild constipation.   pravastatin 10 MG tablet Commonly known as: PRAVACHOL Take 10 mg by mouth daily.   Probiotic Caps Take 1 capsule by mouth daily.   tiZANidine 4 MG tablet Commonly known as: ZANAFLEX Take 4 mg by mouth at bedtime as needed for muscle spasms.   zolpidem 12.5 MG CR tablet Commonly known as: AMBIEN CR Take 12.5 mg by mouth at bedtime as needed for sleep.         Diet and Activity recommendation: See Discharge Instructions above   Consults obtained - TTS   Major procedures and Radiology Reports - PLEASE review detailed and final reports for all details, in brief -      CT Code Stroke CTA Head W/WO contrast  Result Date: 08/26/2019 CLINICAL DATA:  Initial evaluation for acute left facial droop, left-sided weakness. EXAM: CT ANGIOGRAPHY HEAD AND NECK TECHNIQUE: Multidetector CT  imaging of the head and neck was performed using the standard protocol during bolus administration of intravenous contrast. Multiplanar CT image reconstructions and MIPs were obtained to evaluate the vascular anatomy. Carotid stenosis measurements (when applicable) are obtained utilizing NASCET criteria, using the distal internal carotid diameter as the denominator. CONTRAST:  21mL OMNIPAQUE IOHEXOL 350 MG/ML SOLN COMPARISON:  Prior head CT from earlier the same day. FINDINGS: CTA NECK FINDINGS Aortic arch: Partially visualized aortic arch of normal caliber with normal branch pattern. No flow-limiting stenosis seen about the origin of the great vessels. Visualized subclavian arteries widely patent. Right carotid system: Right common carotid artery widely patent from its origin to the bifurcation without stenosis. Mild eccentric plaque about the right bifurcation without significant stenosis. Right ICA widely patent distally to the skull base without stenosis, dissection or occlusion. Left carotid system: Left CCA widely patent from its origin to the bifurcation without stenosis. Mild atheromatous change about the left bifurcation without significant stenosis. Left ICA widely patent distally to the skull base without stenosis, dissection or occlusion. Vertebral arteries: Both vertebral arteries arise from the subclavian arteries. Vertebral arteries widely patent within the neck without stenosis, dissection or occlusion. Skeleton: No visible acute osseous abnormality, although evaluation somewhat limited by lack of sagittal MPR sequence. No discrete or worrisome osseous lesions. Other neck: No other acute soft tissue abnormality within the neck. No mass lesion or adenopathy. Upper chest: Visualized upper chest demonstrates no acute finding. Subcentimeter calcified granuloma noted at the left upper lobe. Review of the MIP images confirms the above findings CTA HEAD FINDINGS Anterior circulation: Petrous, cavernous, and  supraclinoid ICAs widely patent without stenosis or other abnormality. A1 segments patent bilaterally. Normal anterior communicating artery complex. Anterior cerebral arteries patent to their distal aspects without stenosis. No M1 stenosis or occlusion. Negative MCA bifurcations. Distal MCA branches well perfused and symmetric. Posterior circulation: Vertebral arteries patent to the vertebrobasilar junction without stenosis. Both  picas patent. Basilar patent to its distal aspect without stenosis. Superior cerebral arteries patent bilaterally. Both PCAs primarily supplied via the basilar well perfused to their distal aspects. Small right posterior communicating artery noted. Venous sinuses: Grossly patent allowing for timing the contrast bolus. Anatomic variants: None significant.  No visible aneurysm. Review of the MIP images confirms the above findings IMPRESSION: 1. Negative CTA for emergent large vessel occlusion. 2. Mild atheromatous change about the carotid bifurcations without significant stenosis. 3. No other hemodynamically significant or correctable stenosis identified about the major arterial vasculature of the head and neck. Electronically Signed   By: Jeannine Boga M.D.   On: 08/26/2019 02:14   CT Code Stroke CTA Neck W/WO contrast  Result Date: 08/26/2019 CLINICAL DATA:  Initial evaluation for acute left facial droop, left-sided weakness. EXAM: CT ANGIOGRAPHY HEAD AND NECK TECHNIQUE: Multidetector CT imaging of the head and neck was performed using the standard protocol during bolus administration of intravenous contrast. Multiplanar CT image reconstructions and MIPs were obtained to evaluate the vascular anatomy. Carotid stenosis measurements (when applicable) are obtained utilizing NASCET criteria, using the distal internal carotid diameter as the denominator. CONTRAST:  45mL OMNIPAQUE IOHEXOL 350 MG/ML SOLN COMPARISON:  Prior head CT from earlier the same day. FINDINGS: CTA NECK FINDINGS  Aortic arch: Partially visualized aortic arch of normal caliber with normal branch pattern. No flow-limiting stenosis seen about the origin of the great vessels. Visualized subclavian arteries widely patent. Right carotid system: Right common carotid artery widely patent from its origin to the bifurcation without stenosis. Mild eccentric plaque about the right bifurcation without significant stenosis. Right ICA widely patent distally to the skull base without stenosis, dissection or occlusion. Left carotid system: Left CCA widely patent from its origin to the bifurcation without stenosis. Mild atheromatous change about the left bifurcation without significant stenosis. Left ICA widely patent distally to the skull base without stenosis, dissection or occlusion. Vertebral arteries: Both vertebral arteries arise from the subclavian arteries. Vertebral arteries widely patent within the neck without stenosis, dissection or occlusion. Skeleton: No visible acute osseous abnormality, although evaluation somewhat limited by lack of sagittal MPR sequence. No discrete or worrisome osseous lesions. Other neck: No other acute soft tissue abnormality within the neck. No mass lesion or adenopathy. Upper chest: Visualized upper chest demonstrates no acute finding. Subcentimeter calcified granuloma noted at the left upper lobe. Review of the MIP images confirms the above findings CTA HEAD FINDINGS Anterior circulation: Petrous, cavernous, and supraclinoid ICAs widely patent without stenosis or other abnormality. A1 segments patent bilaterally. Normal anterior communicating artery complex. Anterior cerebral arteries patent to their distal aspects without stenosis. No M1 stenosis or occlusion. Negative MCA bifurcations. Distal MCA branches well perfused and symmetric. Posterior circulation: Vertebral arteries patent to the vertebrobasilar junction without stenosis. Both picas patent. Basilar patent to its distal aspect without  stenosis. Superior cerebral arteries patent bilaterally. Both PCAs primarily supplied via the basilar well perfused to their distal aspects. Small right posterior communicating artery noted. Venous sinuses: Grossly patent allowing for timing the contrast bolus. Anatomic variants: None significant.  No visible aneurysm. Review of the MIP images confirms the above findings IMPRESSION: 1. Negative CTA for emergent large vessel occlusion. 2. Mild atheromatous change about the carotid bifurcations without significant stenosis. 3. No other hemodynamically significant or correctable stenosis identified about the major arterial vasculature of the head and neck. Electronically Signed   By: Jeannine Boga M.D.   On: 08/26/2019 02:14   MR BRAIN WO  CONTRAST  Result Date: 08/26/2019 CLINICAL DATA:  Left-sided weakness and left facial droop. EXAM: MRI HEAD WITHOUT CONTRAST TECHNIQUE: Multiplanar, multiecho pulse sequences of the brain and surrounding structures were obtained without intravenous contrast. COMPARISON:  CT and CTA from earlier today FINDINGS: Brain: No acute infarction, hemorrhage, hydrocephalus, extra-axial collection or mass lesion. Normal brain volume. Mild chronic small vessel ischemic type change in the cerebral white matter and pons-patient has multiple vascular risk factors. The bilateral globus pallidus has a normal appearance. Vascular: Normal flow voids Skull and upper cervical spine: Negative Sinuses/Orbits: Partial right mastoid opacification. IMPRESSION: 1. No acute finding.  No MR findings of carbon monoxide poisoning. 2. Mild chronic small vessel ischemia. Electronically Signed   By: Monte Fantasia M.D.   On: 08/26/2019 07:43   CT ABDOMEN PELVIS W CONTRAST  Result Date: 08/10/2019 CLINICAL DATA:  Abdominal pain and vomiting. EXAM: CT ABDOMEN AND PELVIS WITH CONTRAST TECHNIQUE: Multidetector CT imaging of the abdomen and pelvis was performed using the standard protocol following bolus  administration of intravenous contrast. CONTRAST:  167mL OMNIPAQUE IOHEXOL 300 MG/ML  SOLN COMPARISON:  CT scan 06/22/2019 FINDINGS: Lower chest: The lung bases are clear of acute process. No pleural effusion or pulmonary lesions. The heart is normal in size. No pericardial effusion. The distal esophagus and aorta are unremarkable. Hepatobiliary: No focal hepatic lesions or intrahepatic biliary dilatation. The gallbladder appears normal. No common bile duct dilatation. The portal and hepatic veins are patent. Pancreas: No mass, inflammation or ductal dilatation. Small duodenal diverticulum noted near the pancreatic head. Spleen: Small calcified granulomas but no mass. Normal size. Adrenals/Urinary Tract: The adrenal glands and kidneys are unremarkable. The bladder is unremarkable. Stomach/Bowel: The stomach, duodenum and small bowel are unremarkable. No acute inflammatory changes, mass lesions or obstructive findings. The terminal ileum is normal. The appendix is surgically absent. Changes of acute uncomplicated diverticulitis involving the mid sigmoid colon. Severe sigmoid colon diverticulosis. No abscess or free air. Stable significant diverticulosis involving the descending colon also. Vascular/Lymphatic: Age advanced atherosclerotic calcifications involving the aorta and iliac arteries but no aneurysm or dissection. The branch vessels are patent. The major venous structures are patent. No mesenteric or retroperitoneal mass or adenopathy. Reproductive: Surgically absent. Other: No pelvic mass or adenopathy. No free pelvic fluid collections. No inguinal mass or adenopathy. No abdominal wall hernia or subcutaneous lesions. Musculoskeletal: No significant bony findings. IMPRESSION: 1. Acute uncomplicated diverticulitis involving the mid sigmoid colon. 2. No other significant abdominal/pelvic findings, mass lesions or adenopathy. 3. Age advanced atherosclerotic calcifications involving the aorta and iliac arteries.  Aortic Atherosclerosis (ICD10-I70.0). Electronically Signed   By: Marijo Sanes M.D.   On: 08/10/2019 13:01   DG Chest Port 1 View  Result Date: 08/26/2019 CLINICAL DATA:  59 year old female with code stroke. EXAM: PORTABLE CHEST 1 VIEW COMPARISON:  Chest radiograph dated 03/31/2019. FINDINGS: Minimal left lung base atelectasis. No focal consolidation, pleural effusion, or pneumothorax. The cardiac silhouette is within limits. No acute osseous pathology. IMPRESSION: No active disease. Electronically Signed   By: Anner Crete M.D.   On: 08/26/2019 03:25   NCV with EMG(electromyography)  Result Date: 07/30/2019 [name], MD     08/06/2019  3:44 PM  [facility] NCS (NERVE CONDUCTION STUDY) WITH EMG (ELECTROMYOGRAPHY) REPORT STUDY DATE: 07/30/19 PATIENT NAME: AURILLA COULIBALY DOB: [birth year] MRN: [id number] ORDERING CLINICIAN: Andrey Spearman, MD TECHNOLOGIST: Sherre Scarlet ELECTROMYOGRAPHER: Earlean Polka. Penumalli, MD CLINICAL INFORMATION: 59 year old female with ptosis and weakness.  Evaluate for myasthenia gravis.  FINDINGS: NERVE CONDUCTION STUDY: Right median, right ulnar, right peroneal and right tibial motor responses normal. Right sural, right superficial peroneal, right median and right ulnar sensory responses are normal. Right tibial and right ulnar F-wave latencies are normal. Repetitive nerve stimulation of right spinal accessory nerve and recording of right trapezius muscle was performed at baseline, immediately after exertion and at 1 minute increments up to 5 minutes after exertion.  No significant decremental response was noted at baseline or following exertion. NEEDLE ELECTROMYOGRAPHY: Needle examination of right upper extremity is normal. IMPRESSION: This is a normal study.  No electrodiagnostic evidence of large fiber neuropathy, myopathy or neuromuscular junction disorder at this time. INTERPRETING PHYSICIAN: [name], MD Certified in Neurology, Neurophysiology  and Neuroimaging [facility] [address], [place], [place] [address] [phone] [facility]   Nerve / Sites Muscle Latency Ref. Amplitude Ref. Rel Amp Segments Distance Velocity Ref. Area   ms ms mV mV %  cm m/s m/s mVms R Median - APB    Wrist APB 3.0 ?4.4 8.0 ?4.0 100 Wrist - APB 7   26.8    Upper arm APB 7.1  7.5  93.4 Upper arm - Wrist 23 55 ?49 24.5 R Ulnar - ADM    Wrist ADM 2.7 ?3.3 9.2 ?6.0 100 Wrist - ADM 7   24.4    B.Elbow ADM 6.0  8.5  92.2 B.Elbow - Wrist 21 63 ?49 24.1    A.Elbow ADM 7.9  8.5  101 A.Elbow - B.Elbow 10 53 ?49 24.2        A.Elbow - Wrist     R Peroneal - EDB    Ankle EDB 3.5 ?6.5 4.8 ?2.0 100 Ankle - EDB 9   19.8    Fib head EDB 10.1  4.1  85.8 Fib head - Ankle 32 48 ?44 20.6    Pop fossa EDB 12.2  4.4  106 Pop fossa - Fib head 10 49 ?44 21.3        Pop fossa - Ankle     R Tibial - AH    Ankle AH 3.6 ?5.8 8.7 ?4.0 100 Ankle - AH 9   13.3    Pop fossa AH 13.0  4.9  56.4 Pop fossa - Ankle 41 44 ?41 13.4           SNC   Nerve / Sites Rec. Site Peak Lat Ref.  Amp Ref. Segments Distance   ms ms V V  cm R Sural - Ankle (Calf)    Calf Ankle 3.7 ?4.4 15 ?6 Calf - Ankle 14 R Superficial peroneal - Ankle    Lat leg Ankle 2.5 ?4.4 31 ?6 Lat leg - Ankle 14 R Median - Orthodromic (Dig II, Mid palm)    Dig II Wrist 2.7 ?3.4 20 ?10 Dig II - Wrist 13 R Ulnar - Orthodromic, (Dig V, Mid palm)    Dig V Wrist 2.5 ?3.1 11 ?5 Dig V - Wrist 73           F  Wave   Nerve F Lat Ref.  ms ms R Tibial - AH 52.7 ?56.0 R Ulnar - ADM 28.4 ?32.0       Rep Stim   Anatomy / Train Rate Ampl. Ampl 4-1 Fac Ampl Area Area 4-1 Fac Area Time  Hz mV % % mVms % %  R Trapezius (upper) - (Accessory spinal) Baseline @1Hz  1 5.3 0.3 100 51.7 2.6 100 0:00:00 Baseline @3Hz  3  5.5 4.7 103 52.5 20 101 0:00:26 Post Exercise @0 :00 3 4.8 -5 90.1 43.6 6.1 84.2 0:01:52 @ 0:30 3 5.7 5.1 106 52.8 17.1 102 0:02:42 @ 1:00 3 5.8 11 109 54.4 18.3 105 0:03:13 @ 2:00 3 5.7 4.2 107 54.5 20.5 105 0:04:14 @ 3:00 3 5.8 5.7 109 54.6  20 106 0:05:15 @ 4:00 3 5.8 9.1 109 54.1 16.9 105 0:06:16 @ 5:00 3 5.8 6.3 108 54.4 19.8 105 0:07:17     EMG Summary Table   Spontaneous MUAP Recruitment Muscle IA Fib PSW Fasc Other Amp Dur. Poly Pattern R. Deltoid Normal None None None _______ Normal Normal Normal Normal R. Biceps brachii Normal None None None _______ Normal Normal Normal Normal R. Triceps brachii Normal None None None _______ Normal Normal Normal Normal R. Flexor carpi radialis Normal None None None _______ Normal Normal Normal Normal R. First dorsal interosseous Normal None None None _______ Normal Normal Normal Normal    ECHOCARDIOGRAM COMPLETE  Result Date: 08/26/2019    ECHOCARDIOGRAM REPORT   Patient Name:   MARAM BENTLY Date of Exam: 08/26/2019 Medical Rec #:  [id number]        Height:       69.0 in Accession #:    [id number]       Weight:       183.4 lb Date of Birth:  Apr 16, 1960         BSA:          1.991 m Patient Age:    50 years         BP:           156/102 mmHg Patient Gender: F                HR:           84 bpm. Exam Location:  Inpatient Procedure: 2D Echo, Cardiac Doppler, Color Doppler and Intracardiac            Opacification Agent Indications:    TIA  History:        Patient has no prior history of Echocardiogram examinations.                 Risk Factors:Hypertension, Dyslipidemia and Former Smoker.  Sonographer:    Clayton Lefort RDCS (AE) Referring Phys: [id number] [facility] A SMITH  Sonographer Comments: Suboptimal apical window and suboptimal subcostal window. IMPRESSIONS  1. Left ventricular ejection fraction, by estimation, is 55 to 60%. The left ventricle has normal function. The left ventricle has no regional wall motion abnormalities. There is mild concentric left ventricular hypertrophy. Left ventricular diastolic parameters are consistent with Grade I diastolic dysfunction (impaired relaxation).  2. Right ventricular systolic function is normal. The right ventricular size is normal.  3. The mitral valve is normal in  structure. No evidence of mitral valve regurgitation. No evidence of mitral stenosis.  4. The aortic valve is tricuspid. Aortic valve regurgitation is not visualized. No aortic stenosis is present.  5. Aortic dilatation noted. There is mild dilatation of the aortic root measuring 39 mm.  6. The inferior vena cava is normal in size with greater than 50% respiratory variability, suggesting right atrial pressure of 3 mmHg. FINDINGS  Left Ventricle: Left ventricular ejection fraction, by estimation, is 55 to 60%. The left ventricle has normal function. The left ventricle has no regional wall motion abnormalities. Definity contrast agent was given IV to delineate the left ventricular  endocardial borders. The left ventricular internal cavity size was  normal in size. There is mild concentric left ventricular hypertrophy. Left ventricular diastolic parameters are consistent with Grade I diastolic dysfunction (impaired relaxation). Right Ventricle: The right ventricular size is normal. No increase in right ventricular wall thickness. Right ventricular systolic function is normal. Left Atrium: Left atrial size was normal in size. Right Atrium: Right atrial size was normal in size. Pericardium: Trivial pericardial effusion is present. Mitral Valve: The mitral valve is normal in structure. Normal mobility of the mitral valve leaflets. No evidence of mitral valve regurgitation. No evidence of mitral valve stenosis. MV peak gradient, 2.6 mmHg. The mean mitral valve gradient is 1.0 mmHg. Tricuspid Valve: The tricuspid valve is normal in structure. Tricuspid valve regurgitation is not demonstrated. No evidence of tricuspid stenosis. Aortic Valve: The aortic valve is tricuspid. Aortic valve regurgitation is not visualized. No aortic stenosis is present. Aortic valve mean gradient measures 3.0 mmHg. Aortic valve peak gradient measures 4.8 mmHg. Aortic valve area, by VTI measures 2.15 cm. Pulmonic Valve: The pulmonic valve was  normal in structure. Pulmonic valve regurgitation is not visualized. No evidence of pulmonic stenosis. Aorta: Aortic dilatation noted. There is mild dilatation of the aortic root measuring 39 mm. Venous: The inferior vena cava is normal in size with greater than 50% respiratory variability, suggesting right atrial pressure of 3 mmHg. IAS/Shunts: No atrial level shunt detected by color flow Doppler.  LEFT VENTRICLE PLAX 2D LVIDd:         4.70 cm  Diastology LVIDs:         3.40 cm  LV e' lateral:   7.29 cm/s LV PW:         1.20 cm  LV E/e' lateral: 6.7 LV IVS:        1.17 cm  LV e' medial:    5.44 cm/s LVOT diam:     2.10 cm  LV E/e' medial:  9.0 LV SV:         43 LV SV Index:   22 LVOT Area:     3.46 cm  RIGHT VENTRICLE RV S prime:     14.10 cm/s TAPSE (M-mode): 2.6 cm LEFT ATRIUM             Index       RIGHT ATRIUM           Index LA diam:        3.20 cm 1.61 cm/m  RA Area:     13.80 cm LA Vol (A2C):   40.8 ml 20.49 ml/m RA Volume:   30.50 ml  15.32 ml/m LA Vol (A4C):   58.1 ml 29.18 ml/m LA Biplane Vol: 53.1 ml 26.66 ml/m  AORTIC VALVE AV Area (Vmax):    2.11 cm AV Area (Vmean):   1.85 cm AV Area (VTI):     2.15 cm AV Vmax:           109.00 cm/s AV Vmean:          76.800 cm/s AV VTI:            0.201 m AV Peak Grad:      4.8 mmHg AV Mean Grad:      3.0 mmHg LVOT Vmax:         66.30 cm/s LVOT Vmean:        41.100 cm/s LVOT VTI:          0.125 m LVOT/AV VTI ratio: 0.62  AORTA Ao Root diam: 3.90 cm Ao Asc diam:  3.30 cm MITRAL VALVE  MV Area (PHT): 4.80 cm    SHUNTS MV Peak grad:  2.6 mmHg    Systemic VTI:  0.12 m MV Mean grad:  1.0 mmHg    Systemic Diam: 2.10 cm MV Vmax:       0.80 m/s MV Vmean:      47.9 cm/s MV Decel Time: 158 msec MV E velocity: 48.70 cm/s MV A velocity: 73.40 cm/s MV E/A ratio:  0.66 Skeet Latch MD Electronically signed by Skeet Latch MD Signature Date/Time: 08/26/2019/3:47:58 PM    Final    CT HEAD CODE STROKE WO CONTRAST  Result Date: 08/26/2019 CLINICAL DATA:  Code  stroke. Initial evaluation for possible stroke, left-sided weakness with left facial droop. EXAM: CT HEAD WITHOUT CONTRAST TECHNIQUE: Contiguous axial images were obtained from the base of the skull through the vertex without intravenous contrast. COMPARISON:  None available. FINDINGS: Brain: Cerebral volume within normal limits for age. No acute intracranial hemorrhage. No acute large vessel territory infarct. Subcentimeter lucency at the inferior aspect of the left lentiform nucleus favored to reflect a small dilated perivascular space. No mass lesion, midline shift or mass effect. No hydrocephalus or extra-axial fluid collection. At least partially empty sella noted. Vascular: No hyperdense vessel. Skull: Scalp soft tissues and calvarium within normal limits. Sinuses/Orbits: Globes and orbital soft tissues demonstrate no acute finding. Paranasal sinuses are clear. Trace chronic right mastoid effusion noted, of doubtful significance. Other: None. ASPECTS [facility] Stroke Program Early CT Score) - Ganglionic level infarction (caudate, lentiform nuclei, internal capsule, insula, M1-M3 cortex): 7 - Supraganglionic infarction (M4-M6 cortex): 3 Total score (0-10 with 10 being normal): 10 IMPRESSION: 1. Negative head CT.  No acute intracranial abnormality identified. 2. ASPECTS is 10. Critical Value/emergent results were called by telephone at the time of interpretation on 08/26/2019 at 1:02 am to provider Dr. Cheral Marker, who verbally acknowledged these results. Electronically Signed   By: Jeannine Boga M.D.   On: 08/26/2019 01:10    Micro Results     Recent Results (from the past 240 hour(s))  SARS Coronavirus 2 by RT PCR (hospital order, performed in [facility] hospital lab) Nasopharyngeal Nasopharyngeal Swab     Status: None   Collection Time: 08/26/19  2:15 AM   Specimen: Nasopharyngeal Swab  Result Value Ref Range Status   SARS Coronavirus 2 NEGATIVE NEGATIVE Final    Comment: (NOTE) SARS-CoV-2  target nucleic acids are NOT DETECTED.  The SARS-CoV-2 RNA is generally detectable in upper and lower respiratory specimens during the acute phase of infection. The lowest concentration of SARS-CoV-2 viral copies this assay can detect is 250 copies / mL. A negative result does not preclude SARS-CoV-2 infection and should not be used as the sole basis for treatment or other patient management decisions.  A negative result may occur with improper specimen collection / handling, submission of specimen other than nasopharyngeal swab, presence of viral mutation(s) within the areas targeted by this assay, and inadequate number of viral copies (<250 copies / mL). A negative result must be combined with clinical observations, patient history, and epidemiological information.  Fact Sheet for Patients:   StrictlyIdeas.no  Fact Sheet for Healthcare Providers: BankingDealers.co.za  This test is not yet approved or  cleared by the [place] FDA and has been authorized for detection and/or diagnosis of SARS-CoV-2 by FDA under an Emergency Use Authorization (EUA).  This EUA will remain in effect (meaning this test can be used) for the duration of the COVID-19 declaration under Section 564(b)(1) of the  Act, 21 U.S.C. section 360bbb-3(b)(1), unless the authorization is terminated or revoked sooner.  Performed at [facility], [place] [address] [place], [place] [address]         Objective:   Blood pressure (!) 175/99, pulse 82, temperature 99 F (37.2 C), temperature source Oral, resp. rate (!) 21, height 5\' 9"  (1.753 m), weight 83.2 kg, SpO2 99 %.  No intake or output data in the 24 hours ending 08/28/19 1503   Data Review   CBC w Diff:  Lab Results  Component Value Date   WBC 8.3 08/26/2019   HGB 13.6 08/26/2019   HGB 15.1 03/02/2019   HCT 40.0 08/26/2019   HCT 45.1 03/02/2019   PLT 242 08/26/2019   PLT 231 03/02/2019    LYMPHOPCT 16 08/26/2019   MONOPCT 5 08/26/2019   EOSPCT 0 08/26/2019   BASOPCT 1 08/26/2019    CMP:  Lab Results  Component Value Date   NA 142 08/26/2019   NA 138 03/02/2019   K 4.8 08/26/2019   CL 104 08/26/2019   CO2 25 08/26/2019   BUN 15 08/26/2019   BUN 10 03/02/2019   CREATININE 1.20 (H) 08/26/2019   PROT 6.6 08/26/2019   PROT 7.1 03/02/2019   ALBUMIN 3.3 (L) 08/26/2019   ALBUMIN 4.2 03/02/2019   BILITOT 0.9 08/26/2019   BILITOT 0.4 03/02/2019   ALKPHOS 84 08/26/2019   AST 61 (H) 08/26/2019   ALT 29 08/26/2019  .    Phillips Climes M.D  [facility]   Office  [phone]

## 2019-08-28 NOTE — Progress Notes (Signed)
   08/28/19 2100  COVID-19 Daily Checkoff  Have you had a fever (temp > 37.80C/100F)  in the past 24 hours?  No  COVID-19 EXPOSURE  Have you traveled outside the state in the past 14 days? No  Have you been in contact with someone with a confirmed diagnosis of COVID-19 or PUI in the past 14 days without wearing appropriate PPE? No  Have you been living in the same home as a person with confirmed diagnosis of COVID-19 or a PUI (household contact)? No  Have you been diagnosed with COVID-19? No

## 2019-08-28 NOTE — Tx Team (Signed)
Interdisciplinary Treatment and Diagnostic Plan Update  08/28/2019 Time of Session: 9:30am Kathleen Sweeney MRN: [id number]  Principal Diagnosis: <principal problem not specified>  Secondary Diagnoses: Active Problems:   Altered mental status   Current Medications:  Current Facility-Administered Medications  Medication Dose Route Frequency Provider Last Rate Last Admin  . acetaminophen (TYLENOL) tablet 650 mg  650 mg Oral Q6H PRN Sharma Covert, MD      . alum & mag hydroxide-simeth (MAALOX/MYLANTA) 200-200-20 MG/5ML suspension 30 mL  30 mL Oral Q4H PRN Sharma Covert, MD      . doxepin [facility]) capsule 25 mg  25 mg Oral QHS Sharma Covert, MD   25 mg at 08/27/19 2158  . hydrOXYzine (ATARAX/VISTARIL) tablet 25 mg  25 mg Oral TID PRN Sharma Covert, MD   25 mg at 08/27/19 2353  . levothyroxine (SYNTHROID) tablet 100 mcg  100 mcg Oral Q0600 Sharma Covert, MD   100 mcg at 08/28/19 [phone]  . LORazepam (ATIVAN) tablet 1 mg  1 mg Oral BID Sharma Covert, MD   1 mg at 08/28/19 5456  . risperiDONE (RISPERDAL M-TABS) disintegrating tablet 2 mg  2 mg Oral Q8H PRN Sharma Covert, MD       And  . LORazepam (ATIVAN) tablet 1 mg  1 mg Oral PRN Sharma Covert, MD       And  . ziprasidone (GEODON) injection 20 mg  20 mg Intramuscular PRN Sharma Covert, MD      . magnesium hydroxide (MILK OF MAGNESIA) suspension 30 mL  30 mL Oral Daily PRN Sharma Covert, MD      . OLANZapine zydis (ZYPREXA) disintegrating tablet 10 mg  10 mg Oral Daily Sharma Covert, MD      . pantoprazole (PROTONIX) EC tablet 40 mg  40 mg Oral Daily Sharma Covert, MD   40 mg at 08/28/19 2563  . tiZANidine (ZANAFLEX) tablet 4 mg  4 mg Oral Q8H PRN Sharma Covert, MD   4 mg at 08/27/19 2353  . zolpidem (AMBIEN) tablet 5 mg  5 mg Oral QHS PRN Sharma Covert, MD   5 mg at 08/27/19 2158   PTA Medications: Medications Prior to Admission  Medication Sig Dispense Refill Last Dose   . acetaminophen (TYLENOL) 325 MG tablet Take 325 mg by mouth 2 (two) times daily as needed for mild pain.     Marland Kitchen azelastine (ASTELIN) 0.1 % nasal spray Place 2 sprays into both nostrils 2 (two) times daily.     Marland Kitchen docusate sodium (COLACE) 100 MG capsule Take 1 capsule (100 mg total) by mouth daily. 30 capsule 0   . doxepin (SINEQUAN) 25 MG capsule Take 25-50 mg by mouth at bedtime.      . gabapentin (NEURONTIN) 100 MG capsule Take 1 capsule (100 mg total) by mouth 2 (two) times daily for 14 days. 28 capsule 0   . levothyroxine (SYNTHROID) 100 MCG tablet Take 100 mcg by mouth daily.     Marland Kitchen LORazepam (ATIVAN) 1 MG tablet Take 1 mg by mouth 3 (three) times daily as needed for anxiety or sleep.      . Multiple Vitamin (MULTIVITAMIN) capsule Take 1 capsule by mouth daily.     Marland Kitchen omeprazole (PRILOSEC) 20 MG capsule Take 20 mg by mouth daily as needed (for acid reflux).      . polyethylene glycol (MIRALAX) 17 g packet Take 17 g by mouth daily as needed  for mild constipation. 14 each 0   . pravastatin (PRAVACHOL) 10 MG tablet Take 10 mg by mouth daily.     . Probiotic CAPS Take 1 capsule by mouth daily. 30 capsule 1   . tiZANidine (ZANAFLEX) 4 MG tablet Take 4 mg by mouth at bedtime as needed for muscle spasms.      Marland Kitchen zolpidem (AMBIEN CR) 12.5 MG CR tablet Take 12.5 mg by mouth at bedtime as needed for sleep.       Patient Stressors: Financial difficulties Health problems Marital or family conflict  Patient Strengths: Ability for insight Average or above average intelligence Communication skills Supportive family/friends  Treatment Modalities: Medication Management, Group therapy, Case management,  1 to 1 session with clinician, Psychoeducation, Recreational therapy.   Physician Treatment Plan for Primary Diagnosis: <principal problem not specified> Long Term Goal(s):     Short Term Goals:    Medication Management: Evaluate patient's response, side effects, and tolerance of medication  regimen.  Therapeutic Interventions: 1 to 1 sessions, Unit Group sessions and Medication administration.  Evaluation of Outcomes: Not Met  Physician Treatment Plan for Secondary Diagnosis: Active Problems:   Altered mental status  Long Term Goal(s):     Short Term Goals:       Medication Management: Evaluate patient's response, side effects, and tolerance of medication regimen.  Therapeutic Interventions: 1 to 1 sessions, Unit Group sessions and Medication administration.  Evaluation of Outcomes: Not Met   RN Treatment Plan for Primary Diagnosis: <principal problem not specified> Long Term Goal(s): Knowledge of disease and therapeutic regimen to maintain health will improve  Short Term Goals: Ability to remain free from injury will improve, Ability to verbalize frustration and anger appropriately will improve, Ability to disclose and discuss suicidal ideas, Ability to identify and develop effective coping behaviors will improve and Compliance with prescribed medications will improve  Medication Management: RN will administer medications as ordered by provider, will assess and evaluate patient's response and provide education to patient for prescribed medication. RN will report any adverse and/or side effects to prescribing provider.  Therapeutic Interventions: 1 on 1 counseling sessions, Psychoeducation, Medication administration, Evaluate responses to treatment, Monitor vital signs and CBGs as ordered, Perform/monitor CIWA, COWS, AIMS and Fall Risk screenings as ordered, Perform wound care treatments as ordered.  Evaluation of Outcomes: Not Met   LCSW Treatment Plan for Primary Diagnosis: <principal problem not specified> Long Term Goal(s): Safe transition to appropriate next level of care at discharge, Engage patient in therapeutic group addressing interpersonal concerns.  Short Term Goals: Engage patient in aftercare planning with referrals and resources, Increase social  support, Identify triggers associated with mental health/substance abuse issues and Increase skills for wellness and recovery  Therapeutic Interventions: Assess for all discharge needs, 1 to 1 time with Social worker, Explore available resources and support systems, Assess for adequacy in community support network, Educate family and significant other(s) on suicide prevention, Complete Psychosocial Assessment, Interpersonal group therapy.  Evaluation of Outcomes: Not Met   Progress in Treatment: Attending groups: No. Participating in groups: No. Taking medication as prescribed: Yes. Toleration medication: Yes. Family/Significant other contact made: No, will contact:  if consent is given. Patient understands diagnosis: Yes. Discussing patient identified problems/goals with staff: Yes. Medical problems stabilized or resolved: Yes. Denies suicidal/homicidal ideation: Yes. Issues/concerns per patient self-inventory: No. Other: none  New problem(s) identified: No, Describe:  none.  New Short Term/Long Term Goal(s): medication stabilization, elimination of SI thoughts, development of comprehensive mental wellness plan.  Patient Goals:  "To go home today"  Discharge Plan or Barriers: Patient recently admitted. CSW will continue to follow and assess for appropriate referrals and possible discharge planning.   Reason for Continuation of Hospitalization: Anxiety Depression Medication stabilization  Estimated Length of Stay: 3-5 days   Attendees: Patient: Kathleen Sweeney 08/28/2019   Physician: Myles Lipps, MD 08/28/2019  Nursing:  08/28/2019   RN Care Manager: 08/28/2019   Social Worker: Darletta Moll, [place] 08/28/2019  Recreational Therapist:  08/28/2019   Other:  08/28/2019  Other:  08/28/2019   Other: 08/28/2019      Scribe for Treatment Team: Vassie Moselle, [place] 08/28/2019 11:40 AM

## 2019-08-29 DIAGNOSIS — F332 Major depressive disorder, recurrent severe without psychotic features: Secondary | ICD-10-CM

## 2019-08-29 DIAGNOSIS — F23 Brief psychotic disorder: Secondary | ICD-10-CM

## 2019-08-29 DIAGNOSIS — F411 Generalized anxiety disorder: Secondary | ICD-10-CM

## 2019-08-29 MED ORDER — LORAZEPAM 1 MG PO TABS
1.0000 mg | ORAL_TABLET | Freq: Every day | ORAL | Status: DC
  Administered 2019-08-29 – 2019-08-30 (×2): 1 mg via ORAL
  Filled 2019-08-29 (×2): qty 1

## 2019-08-29 MED ORDER — ZOLPIDEM TARTRATE 5 MG PO TABS
5.0000 mg | ORAL_TABLET | Freq: Every evening | ORAL | Status: DC | PRN
  Administered 2019-08-29 – 2019-08-30 (×2): 5 mg via ORAL
  Filled 2019-08-29 (×2): qty 1

## 2019-08-29 MED ORDER — LORAZEPAM 1 MG PO TABS
1.0000 mg | ORAL_TABLET | Freq: Once | ORAL | Status: AC
  Administered 2019-08-29: 1 mg via ORAL

## 2019-08-29 NOTE — Progress Notes (Signed)
Pt did not attend wrap-up group   

## 2019-08-29 NOTE — Progress Notes (Signed)
   08/29/19 2339  Psych Admission Type (Psych Patients Only)  Admission Status Involuntary  Psychosocial Assessment  Patient Complaints Insomnia;Anxiety  Eye Contact Fair  Facial Expression Anxious  Affect Anxious;Appropriate to circumstance  Speech Logical/coherent  Interaction Assertive  Motor Activity Slow  Appearance/Hygiene Improved  Behavior Characteristics Appropriate to situation  Mood Depressed  Thought Process  Coherency WDL  Content WDL  Delusions None reported or observed  Perception WDL  Hallucination None reported or observed  Judgment Poor  Confusion None  Danger to Self  Current suicidal ideation? Denies  Danger to Others  Danger to Others None reported or observed
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
## 2019-08-29 NOTE — Progress Notes (Signed)
   08/29/19 2337  COVID-19 Daily Checkoff  Have you had a fever (temp > 37.80C/100F)  in the past 24 hours?  No  If you have had runny nose, nasal congestion, sneezing in the past 24 hours, has it worsened? No  COVID-19 EXPOSURE  Have you traveled outside the state in the past 14 days? No  Have you been in contact with someone with a confirmed diagnosis of COVID-19 or PUI in the past 14 days without wearing appropriate PPE? No  Have you been living in the same home as a person with confirmed diagnosis of COVID-19 or a PUI (household contact)? No  Have you been diagnosed with COVID-19? No
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
## 2019-08-29 NOTE — Progress Notes (Signed)
Pt reported that she did not sleep well last night.  She said she has been having issues with insomnia for the past several years.  Pt denies SI/HI/AVH.  Denies feeling depressed but endorsed having anxiety.  Pt is interacting with peers and spending time in dayroom. Pt took medications without incident and no adverse reactions were noted.  Pt reported that she is coping well at this time. Q15 min safety checks remain in place.

## 2019-08-29 NOTE — BHH Suicide Risk Assessment (Signed)
[place] INPATIENT:  Family/Significant Other Suicide Prevention Education  Suicide Prevention Education:  Patient Refusal for Family/Significant Other Suicide Prevention Education: The patient Kathleen Sweeney has refused to provide written consent for family/significant other to be provided Family/Significant Other Suicide Prevention Education during admission and/or prior to discharge.  Physician notified.  Suicide Prevention Education was reviewed thoroughly with patient, including risk factors, warning signs, and what to do.  Mobile Crisis services were described and that telephone number pointed out, with encouragement to patient to put this number in personal cell phone.  Brochure was provided to patient to share with natural supports.  Patient acknowledged the ways in which they are at risk, and how working through each of their issues can gradually start to reduce their risk factors.  Patient was encouraged to think of the information in the context of people in their own lives.  Patient denied having access to firearms  Patient verbalized understanding of information provided.  Patient endorsed a desire to live.      Berlin Hun Grossman-Orr 08/29/2019, 3:58 PM

## 2019-08-29 NOTE — BHH Group Notes (Signed)
Adult Psychoeducational Group Note  Date:  08/29/2019 Time:  9:32 PM  Group Topic/Focus:  Wrap-Up Group:   The focus of this group is to help patients review their daily goal of treatment and discuss progress on daily workbooks.  Participation Level:  Active  Participation Quality:  Appropriate and Attentive  Affect:  Appropriate  Cognitive:  Alert and Appropriate  Insight: Appropriate and Good  Engagement in Group:  Engaged  Modes of Intervention:  Discussion and Education  Additional Comments:  Pt attended and participated in wrap up group this evening and rated their day a 8/10, due to them speaking with the SW about their anticipated discharge on Monday. Pt completed their goal, which was to get some sleep.   Cristi Loron 08/29/2019, 9:32 PM

## 2019-08-29 NOTE — BHH Group Notes (Signed)
[facility] LCSW Group Therapy Note  Date/Time:    08/29/2019 10:00-11:00AM  Type of Therapy and Topic:  Group Therapy:  Healthy vs Unhealthy Coping Skills  Participation Level:  Active   Description of Group:  The focus of this group was to determine what unhealthy coping techniques typically are used by group members and what healthy coping techniques would be helpful in coping with various problems. Patients were guided in becoming aware of the differences between healthy and unhealthy coping techniques.  Patients were asked to identify 1-2 healthy coping skills they would like to learn to use more effectively, and many mentioned meditation, breathing, and relaxation.  At the end of group, additional ideas of healthy coping skills were shared in a fun exercise.  Therapeutic Goals Patients learned that coping is what human beings do all day long to deal with various situations in their lives Patients defined and discussed healthy vs unhealthy coping techniques Patients identified their preferred coping techniques and identified whether these were healthy or unhealthy Patients determined 1-2 healthy coping skills they would like to become more familiar with and use more often Patients provided support and ideas to each other  Summary of Patient Progress: During group, patient expressed that some of her healthy coping skills including talking to someone, spending time with her granddaughter, and keeping herself busy.  Unhealthy coping mechanisms she often uses include isolation, blaming others, and being angry at others.  She stated she tends to live in the past and remember past wrongs which hinders her ability to be present in the moment.  She expressed an interest in doing some volunteer work, talked about possibly visiting or talking to older citizens as one way to both spend her time as well as to help others.  She was insightful, supportive of others, and willing to learn and grow in thinking about  her range of coping skills.   Therapeutic Modalities Cognitive Behavioral Therapy Motivational Interviewing   Selmer Dominion, LCSW 08/29/2019, 9:21 AM

## 2019-08-29 NOTE — Progress Notes (Signed)
Pt c/o chronic insomnia for the last 10 years. She denies having any racing thoughts at bedtime and she cannot identify any other stressors that may keep her awake. She was provided her PRN 5 mg of Ambien at 2124 which was not effective. Pt then returned to the nurses station and was administered her PRN 25 mg of Vistaril at 2317 which was not effective as well. NP, Lindon Romp was then notified and ordered a one time dose of 1 mg of Ativan po for insomnia. Pt said that she has tried sleep hygiene, diet changes, and even seen specialists for her issue with insomnia. Active listening, reassurance, and support provided. Pt encouraged to discuss her concern with the provider in the AM and also informed that she will be started on 15 mg of zyprexa tonight which may help. Q 15 min safety checks continue. Pt's safety has been maintained.   08/28/19 2100  Psych Admission Type (Psych Patients Only)  Admission Status Involuntary  Psychosocial Assessment  Patient Complaints Anxiety;Insomnia  Eye Contact Fair  Facial Expression Anxious;Flat  Affect Anxious;Flat;Sad  Speech Logical/coherent  Interaction Assertive;Isolative  Motor Activity Slow  Appearance/Hygiene Disheveled  Behavior Characteristics Cooperative;Appropriate to situation;Anxious  Mood Anxious  Thought Process  Coherency Circumstantial  Content WDL  Delusions None reported or observed  Perception WDL  Hallucination None reported or observed  Judgment Poor  Confusion None  Danger to Self  Current suicidal ideation? Denies  Danger to Others  Danger to Others None reported or observed

## 2019-08-30 DIAGNOSIS — F23 Brief psychotic disorder: Principal | ICD-10-CM

## 2019-08-30 MED ORDER — TRAZODONE HCL 50 MG PO TABS
50.0000 mg | ORAL_TABLET | Freq: Once | ORAL | Status: AC
  Administered 2019-08-31: 50 mg via ORAL
  Filled 2019-08-30: qty 1

## 2019-08-30 MED ORDER — MIRTAZAPINE 7.5 MG PO TABS
7.5000 mg | ORAL_TABLET | Freq: Every day | ORAL | Status: DC
  Administered 2019-08-30: 7.5 mg via ORAL
  Filled 2019-08-30 (×3): qty 1

## 2019-08-30 NOTE — Progress Notes (Signed)
   08/30/19 2327  COVID-19 Daily Checkoff  Have you had a fever (temp > 37.80C/100F)  in the past 24 hours?  No  If you have had runny nose, nasal congestion, sneezing in the past 24 hours, has it worsened? No  COVID-19 EXPOSURE  Have you traveled outside the state in the past 14 days? No  Have you been in contact with someone with a confirmed diagnosis of COVID-19 or PUI in the past 14 days without wearing appropriate PPE? No  Have you been living in the same home as a person with confirmed diagnosis of COVID-19 or a PUI (household contact)? No  Have you been diagnosed with COVID-19? No

## 2019-08-30 NOTE — Progress Notes (Signed)
   08/30/19 0900  Psych Admission Type (Psych Patients Only)  Admission Status Involuntary  Psychosocial Assessment  Patient Complaints Anxiety  Eye Contact Fair  Facial Expression Anxious  Affect Anxious  Speech Logical/coherent  Interaction Isolative  Motor Activity Slow  Appearance/Hygiene Improved  Behavior Characteristics Appropriate to situation  Mood Depressed  Thought Process  Coherency WDL  Content WDL  Delusions None reported or observed  Perception WDL  Hallucination None reported or observed  Judgment Poor  Confusion None  Danger to Self  Current suicidal ideation? Denies  Danger to Others  Danger to Others None reported or observed

## 2019-08-30 NOTE — Progress Notes (Signed)
[facility] MD Progress Note  08/30/2019 1:56 PM Kathleen Sweeney  MRN:  [id number]   Subjective:  Patient states her mood is " great" and just wants to go home today. She states first time in months - she slept better last night. She talked about the incident and got really angry- "I can't argue about anything because I'm here. I know I'm not depressed". She adds " After adding one and one from ER, EMS, [place] and inpatient unit I think it was a suicide attempt", But I am fine now and just wants to go home". She reports her appetite is good. She complains about not been helped enough with her sleeping habits.She complains about her roommate being a mess, not cleaning after her at all and wanting hair products for her hair.Denies any suicidal ideation or homicidal ideation.Denies AVH.  Objective: Patient is seen and examined. Patient is a 59 year old female with a past psychiatric history significant for depression who presented to the [facility] emergency department on 08/26/2019. Patient is alert, tearful and angry today about not able to leave hospital and go home. She is agitated and after going back and forth admits to be an intentional suicide attempt.She is informed about adding new medication and decreasing her levothyroxine to help her sleep better. She does not seem to be responding to the internal stimuli and is not showing any self injurious behavior on the unit. Vitals are stable today except pulse is 101. No new labs today.  Principal Problem: Brief psychotic disorder ([place]) Diagnosis: Principal Problem:   Brief psychotic disorder ([place]) Active Problems:   Altered mental status   Major depressive disorder, recurrent episode, severe ([place])   Generalized anxiety disorder  Total Time spent with patient: 25 minutes  Past Psychiatric History: See H & P  Past Medical History:  Past Medical History:  Diagnosis Date  . CKD (chronic kidney disease)   . Depression   . Facial  droop   . Fibromyalgia   . Hyperlipidemia   . Hypertension   . Hypothyroid   . IBS (irritable bowel syndrome)   . Insomnia   . Lupus ([place])   . Migraine   . RLS (restless legs syndrome)   . Skin cancer Had cancer removed in 2000  . Thyroid disease     Past Surgical History:  Procedure Laterality Date  . ABDOMINAL HYSTERECTOMY  2008  . APPENDECTOMY  1998  . thoracic tumor  03/1995   excision   Family History:  Family History  Problem Relation Age of Onset  . Sleep apnea Mother   . Irregular heart beat Mother        A fib  . Hypertension Mother   . Diabetes Mother   . Dementia Mother   . Pneumonia Father   . Sleep apnea Brother   . Dementia Maternal Grandmother    Family Psychiatric  History: See H & P Social History:  Social History   Substance and Sexual Activity  Alcohol Use No     Social History   Substance and Sexual Activity  Drug Use Not Currently  . Types: Marijuana, Other-see comments   Comment: CBD oil, last used 2020    Social History   Socioeconomic History  . Marital status: Married    Spouse name: Araceli Bouche  . Number of children: 2  . Years of education: Not on file  . Highest education level: High school graduate  Occupational History    Comment: na  Tobacco  Use  . Smoking status: Former Smoker    Packs/day: 2.00    Years: 30.00    Pack years: 60.00    Types: Cigarettes    Quit date: 06/23/2006    Years since quitting: 13.1  . Smokeless tobacco: Never Used  Vaping Use  . Vaping Use: Never used  Substance and Sexual Activity  . Alcohol use: No  . Drug use: Not Currently    Types: Marijuana, Other-see comments    Comment: CBD oil, last used 2020  . Sexual activity: Not Currently  Other Topics Concern  . Not on file  Social History Narrative   Lives with spouse   Caffeine-tea 3-4 weekly   Social Determinants of Health   Financial Resource Strain:   . Difficulty of Paying Living Expenses:   Food Insecurity:   . Worried About  Charity fundraiser in the Last Year:   . Arboriculturist in the Last Year:   Transportation Needs:   . Film/video editor (Medical):   Marland Kitchen Lack of Transportation (Non-Medical):   Physical Activity:   . Days of Exercise per Week:   . Minutes of Exercise per Session:   Stress:   . Feeling of Stress :   Social Connections:   . Frequency of Communication with Friends and Family:   . Frequency of Social Gatherings with Friends and Family:   . Attends Religious Services:   . Active Member of Clubs or Organizations:   . Attends Archivist Meetings:   Marland Kitchen Marital Status:    Additional Social History:                         Sleep: Good  Appetite:  Good  Current Medications: Current Facility-Administered Medications  Medication Dose Route Frequency Provider Last Rate Last Admin  . acetaminophen (TYLENOL) tablet 650 mg  650 mg Oral Q6H PRN Sharma Covert, MD   650 mg at 08/29/19 1945  . alum & mag hydroxide-simeth (MAALOX/MYLANTA) 200-200-20 MG/5ML suspension 30 mL  30 mL Oral Q4H PRN Sharma Covert, MD      . fluconazole (DIFLUCAN) tablet 150 mg  150 mg Oral Weekly Lindell Spar I, NP   150 mg at 08/28/19 1754  . hydrOXYzine (ATARAX/VISTARIL) tablet 25 mg  25 mg Oral TID PRN Sharma Covert, MD   25 mg at 08/29/19 2133  . levothyroxine (SYNTHROID) tablet 75 mcg  75 mcg Oral Q0600 Sharma Covert, MD   75 mcg at 08/30/19 0254  . risperiDONE (RISPERDAL M-TABS) disintegrating tablet 2 mg  2 mg Oral Q8H PRN Sharma Covert, MD   2 mg at 08/30/19 1303   And  . LORazepam (ATIVAN) tablet 1 mg  1 mg Oral PRN Sharma Covert, MD       And  . ziprasidone (GEODON) injection 20 mg  20 mg Intramuscular PRN Sharma Covert, MD      . LORazepam (ATIVAN) tablet 1 mg  1 mg Oral QHS Lindell Spar I, NP   1 mg at 08/29/19 2134  . magnesium hydroxide (MILK OF MAGNESIA) suspension 30 mL  30 mL Oral Daily PRN Sharma Covert, MD      . mirtazapine (REMERON)  tablet 7.5 mg  7.5 mg Oral QHS Sharma Covert, MD      . OLANZapine zydis [facility]) disintegrating tablet 15 mg  15 mg Oral QHS Sharma Covert, MD   15 mg  at 08/29/19 2133  . zolpidem (AMBIEN) tablet 5 mg  5 mg Oral QHS PRN Lindell Spar I, NP   5 mg at 08/29/19 2134    Lab Results: No results found for this or any previous visit (from the past 48 hour(s)).  Blood Alcohol level:  Lab Results  Component Value Date   ETH <10 08/26/2019   [facility]  06/10/2009    <5        LOWEST DETECTABLE LIMIT FOR SERUM ALCOHOL IS 5 mg/dL FOR MEDICAL PURPOSES ONLY    Metabolic Disorder Labs: No results found for: HGBA1C, MPG No results found for: PROLACTIN Lab Results  Component Value Date   CHOL 240 (H) 08/27/2019   TRIG 273 (H) 08/27/2019   HDL 44 08/27/2019   CHOLHDL 5.5 08/27/2019   VLDL 55 (H) 08/27/2019   LDLCALC 141 (H) 08/27/2019    Physical Findings: AIMS: Facial and Oral Movements Muscles of Facial Expression: None, normal Lips and Perioral Area: None, normal Jaw: None, normal Tongue: None, normal,Extremity Movements Upper (arms, wrists, hands, fingers): None, normal Lower (legs, knees, ankles, toes): None, normal, Trunk Movements Neck, shoulders, hips: None, normal, Overall Severity Severity of abnormal movements (highest score from questions above): None, normal Incapacitation due to abnormal movements: None, normal Patient's awareness of abnormal movements (rate only patient's report): No Awareness, Dental Status Current problems with teeth and/or dentures?: No Does patient usually wear dentures?: No  CIWA:    COWS:     Musculoskeletal: Strength & Muscle Tone: within normal limits Gait & Station: normal Patient leans: N/A  Psychiatric Specialty Exam: Physical Exam Neurological:     Mental Status: She is alert and oriented to person, place, and time.     Review of Systems  Blood pressure 91/68, pulse (!) 101, temperature 97.7 F (36.5 C), temperature source  Oral, resp. rate 16, height 5\' 8"  (1.727 m), weight 81.6 kg, SpO2 95 %.Body mass index is 27.37 kg/m.  General Appearance: Casual  Eye Contact:  Good  Speech:  Pressured  Volume:  Increased  Mood:  Angry  Affect:  Labile  Thought Process:  Coherent  Orientation:  Full (Time, Place, and Person)  Thought Content:  WDL  Suicidal Thoughts:  No  Homicidal Thoughts:  No  Memory:  Immediate;   Fair  Judgement:  Impaired  Insight:  Lacking  Psychomotor Activity:  Normal  Concentration:  Concentration: Good  Recall:  Poor  Fund of Knowledge:  Fair  Language:  Good  Akathisia:  No  Handed:  Right  AIMS (if indicated):     Assets:  Communication Skills Desire for Improvement Resilience  ADL's:  Intact  Cognition:  WNL  Sleep:  Number of Hours: 5.75   Assessment: Kathleen Thalmann Bishopis a 59 y.o.femalewith past psychiatric history of depression, anxiety, fibromyalgia and medical history significant ofhypertension, hyperlipidemia, hypothyroidism, lupus, RLS,andhypothyroidismpresented to the hospital after calling 911 working up to alarms going off.  Today she admitted the possibility of intentional suicide attempt. Denies depression but angry & agitated about not able to leave hospital to go home. Threatened today to stop all her medications but with encouragement agreed to the new plan.     Treatment Plan Summary: Daily contact with patient to assess and evaluate symptoms and progress in treatment.   Plan:  1. Start Remeron 7.5 mg QHS daily today to help with depression and insomnia. 2. Decrease Levothyroxine to 75 mcg as her TSH is in lower limits of normal. 3. Continue Zyprexa 15 mg  PO QHS for mood. 4. Start Risperdal 2 mg PO Q8H PRN. 5. Continue Ambien 5 mg po Q hs prn. 6. Continue Lorazepam 1 mg po Q hs for anxiety/insomnia  7. Encouragement to attend group therapies. 8. Disposition in progress.  Honor Junes, MD 08/30/2019, 1:56 PM

## 2019-08-30 NOTE — Progress Notes (Signed)
   08/30/19 2330  Psych Admission Type (Psych Patients Only)  Admission Status Involuntary  Psychosocial Assessment  Patient Complaints Anxiety;Insomnia  Eye Contact Fair  Facial Expression Anxious  Affect Appropriate to circumstance  Speech Logical/coherent  Interaction Assertive  Motor Activity Slow  Appearance/Hygiene Improved  Behavior Characteristics Appropriate to situation  Mood Anxious;Pleasant  Thought Process  Coherency WDL  Content WDL  Delusions None reported or observed  Perception WDL  Hallucination None reported or observed  Judgment Poor  Confusion None  Danger to Self  Current suicidal ideation? Denies  Danger to Others  Danger to Others None reported or observed

## 2019-08-30 NOTE — BHH Group Notes (Signed)
[place] LCSW Group Therapy Note  Date/Time:  08/30/2019 9:00-10:00 or 10:00-11:00AM  Type of Therapy and Topic:  Group Therapy:  Healthy and Unhealthy Supports  Participation Level:  Did Not Attend   Description of Group:  Patients in this group were introduced to the idea of adding a variety of healthy supports to address the various needs in their lives.Patients discussed what additional healthy supports could be helpful in their recovery and wellness after discharge in order to prevent future hospitalizations.   An emphasis was placed on using counselor, doctor, therapy groups, 12-step groups, and problem-specific support groups to expand supports.  Several songs were played to emphasize points made throughout group.  Therapeutic Goals:   1)  discuss importance of adding supports to stay well once out of the hospital  2)  compare healthy versus unhealthy supports and identify some examples of each  3)  generate ideas and descriptions of healthy supports that can be added  4)  offer mutual support about how to address unhealthy supports  5)  encourage active participation in and adherence to discharge plan    Summary of Patient Progress:  N/a   Therapeutic Modalities:   Motivational Interviewing Brief Solution-Focused Therapy  Selmer Dominion, LCSW

## 2019-08-30 NOTE — Progress Notes (Signed)
[place] Group Notes:  (Nursing/MHT/Case Management/Adjunct)  Date:  08/30/2019  Time:  2000  Type of Therapy:  wrap up group  Participation Level:  Active  Participation Quality:  Appropriate, Attentive, Sharing and Supportive  Affect:  Appropriate  Cognitive:  Appropriate  Insight:  Improving  Engagement in Group:  Engaged  Modes of Intervention:  Clarification, Education and Support  Summary of Progress/Problems: Positive thinking and self-care were discussed.   Shellia Cleverly 08/30/2019, 9:45 PM

## 2019-08-30 NOTE — Plan of Care (Signed)
  Problem: Education: Goal: Utilization of techniques to improve thought processes will improve Outcome: Progressing   Problem: Activity: Goal: Interest or engagement in leisure activities will improve Outcome: Progressing   D: Patient has complained of some anxiety today. She requested her ativan, and I explained that it was due at bedtime. She took 2 mg risperdal with some improvement. She is observed in the day room interacting with her peers.    A: Continue to monitor medication management and MD orders.  Safety checks completed every 15 minutes per protocol.  Offer support and encouragement as needed.  R: Patient is receptive to staff; her behavior is appropriate.

## 2019-08-31 MED ORDER — FLUCONAZOLE 150 MG PO TABS: 150.0000 mg | ORAL_TABLET | ORAL | 0 refills | Status: DC

## 2019-08-31 MED ORDER — LEVOTHYROXINE SODIUM 75 MCG PO TABS: 75.0000 ug | ORAL_TABLET | Freq: Every day | ORAL | 0 refills | Status: AC

## 2019-08-31 MED ORDER — OLANZAPINE 15 MG PO TBDP: 15.0000 mg | ORAL_TABLET | Freq: Every day | ORAL | 0 refills | Status: DC

## 2019-08-31 MED ORDER — LORAZEPAM 1 MG PO TABS: 1.0000 mg | ORAL_TABLET | Freq: Every day | ORAL | 0 refills | Status: DC

## 2019-08-31 MED ORDER — ZOLPIDEM TARTRATE 5 MG PO TABS: 5.0000 mg | ORAL_TABLET | Freq: Every evening | ORAL | 0 refills | Status: DC | PRN

## 2019-08-31 NOTE — Progress Notes (Signed)
Discharge Note:  Patient discharged via transport.  Patient denied SI and HI.  Denied A/V hallucinations.  Suicide prevention information given and discussed with patient who stated she understood and had no questions.  Patient stated she received all her belongings, clothing, toiletries, misc items, etc.  Patient stated she appreciated all assistance received from [facility] staff.  All required discharge information given to patient at discharge.

## 2019-08-31 NOTE — Progress Notes (Signed)
  [facility] Adult Case Management Discharge Plan :  Will you be returning to the same living situation after discharge:  Yes,  home At discharge, do you have transportation home?: Yes,  brother in-law Do you have the ability to pay for your medications: Yes,  BCBS  Release of information consent forms completed and in the chart;  Patient's signature needed at discharge.  Patient to Follow up at:  Follow-up Information    Chucky May, MD Follow up on 09/25/2019.   Specialty: Psychiatry Why: You have an appointment on 09/25/19 at 5:30 pm.  This appointment will be Virtual. Contact information: [place]  [address] [phone]               Next level of care provider has access to [place] and Suicide Prevention discussed: Yes,  yes     Has patient been referred to the Quitline?: Patient refused referral  Patient has been referred for addiction treatment: [place], LCSW 08/31/2019, 11:04 AM

## 2019-08-31 NOTE — Progress Notes (Signed)
D:  Patient's self inventory sheet, patient has poor sleep, sleep medication not helpful.  Fair appetite, low energy level, good concentration.  Rated depression 5, denied hopeless, anxiety 2.  Denied withdrawals. Denied SI.  Physical problems, fibro, headaches, blurred vision, pain, back and arms, pain #6 in past 24 hours.  Goal is discharge.  Does have discharge plans. A:  Medications administered per MD orders.  Emotional support and encouragement given patient. R:  Denied SI and HI, contracts for safety.  Denied A/V hallucinations.  Safety maintained with 15 minute checks.

## 2019-08-31 NOTE — BHH Suicide Risk Assessment (Signed)
[facility] Discharge Suicide Risk Assessment   Principal Problem: Brief psychotic disorder [facility]) Discharge Diagnoses: Principal Problem:   Brief psychotic disorder ([place]) Active Problems:   Altered mental status   Major depressive disorder, recurrent episode, severe ([place])   Generalized anxiety disorder   Total Time spent with patient: 30 minutes  Musculoskeletal: Strength & Muscle Tone: within normal limits Gait & Station: normal Patient leans: N/A  Psychiatric Specialty Exam: Review of Systems  Musculoskeletal: Positive for arthralgias.  Psychiatric/Behavioral: Positive for sleep disturbance.  All other systems reviewed and are negative.   Blood pressure (!) 134/101, pulse (!) 106, temperature 97.6 F (36.4 C), temperature source Oral, resp. rate 16, height 5\' 8"  (1.727 m), weight 81.6 kg, SpO2 99 %.Body mass index is 27.37 kg/m.  General Appearance: Casual  Eye Contact::  Good  Speech:  Normal Rate409  Volume:  Normal  Mood:  Euthymic  Affect:  Congruent  Thought Process:  Coherent and Descriptions of Associations: Intact  Orientation:  Full (Time, Place, and Person)  Thought Content:  Logical  Suicidal Thoughts:  No  Homicidal Thoughts:  No  Memory:  Immediate;   Good Recent;   Good Remote;   Good  Judgement:  Intact  Insight:  Fair  Psychomotor Activity:  Normal  Concentration:  Good  Recall:  Good  Fund of Knowledge:Good  Language: Good  Akathisia:  Negative  Handed:  Right  AIMS (if indicated):     Assets:  Communication Skills Desire for Improvement Housing Resilience Social Support Talents/Skills Transportation  Sleep:  Number of Hours: 3.5  Cognition: WNL  ADL's:  Intact   Mental Status Per Nursing Assessment::   On Admission:  Suicidal ideation indicated by others  Demographic Factors:  Caucasian, Living alone and Unemployed  Loss Factors: Loss of significant relationship  Historical Factors: Impulsivity  Risk Reduction Factors:   Positive  social support and Positive therapeutic relationship  Continued Clinical Symptoms:  Depression:   Impulsivity Insomnia  Cognitive Features That Contribute To Risk:  None    Suicide Risk:  Minimal: No identifiable suicidal ideation.  Patients presenting with no risk factors but with morbid ruminations; may be classified as minimal risk based on the severity of the depressive symptoms   Follow-up Information    Chucky May, MD Follow up.   Specialty: Psychiatry Why: Appointment needed Contact information: [place] [address] [phone]               Plan Of Care/Follow-up recommendations:  Activity:  ad lib  Sharma Covert, MD 08/31/2019, 9:26 AM

## 2019-08-31 NOTE — Progress Notes (Signed)
Recreation Therapy Notes  Date:  7.19.20 Time: 0945 Location: [address] Group Room  Group Topic: Stress Management  Goal Area(s) Addresses:  Patient will identify positive stress management techniques. Patient will identify benefits of using stress management post d/c.  Behavioral Response: Engaged  Intervention: Stress Management   Activity : Meditation.  LRT played Sweeney meditation that focused on being resilient in the face of adversity.  Patients were to listen to meditation as it played to engage in activity.   Education:  Stress Management, Discharge Planning.   Education Outcome: Acknowledges Education  Clinical Observations/Feedback: Pt attended and participated in activity.    Kathleen Sweeney, LRT/CTRS         Kathleen Sweeney 08/31/2019 11:20 AM 

## 2019-08-31 NOTE — Discharge Summary (Signed)
Physician Discharge Summary Note  Patient:  Kathleen Sweeney is an 59 y.o., female MRN:  [id number] DOB:  1960-02-13 Patient phone:  [phone] (home)  Patient address:   [place] [address],  Total Time spent with patient: 30 minutes  Date of Admission:  08/27/2019 Date of Discharge: 08/31/19  Reason for Admission:   Laurieann Friddle Bishopis a 59 y.o.femalewith past psychiatric history of depression, anxiety, fibromyalgia and medical history significant ofhypertension, hyperlipidemia, hypothyroidism, lupus, RLS,andhypothyroidismpresented to the hospital after calling 911 working up to CO alarms going off ( suicide attempt). She was admitted to hospital for further evaluation and stabilization.  Principal Problem: Brief psychotic disorder [facility]) Discharge Diagnoses: Principal Problem:   Brief psychotic disorder ([place]) Active Problems:   Altered mental status   Major depressive disorder, recurrent episode, severe ([place])   Generalized anxiety disorder   Past Psychiatric History: She has past psychiatric history of depression, fibromyalgia, anxiety and reported 3 suicide attempts 10 years ago.  Past Medical History:  Past Medical History:  Diagnosis Date  . CKD (chronic kidney disease)   . Depression   . Facial droop   . Fibromyalgia   . Hyperlipidemia   . Hypertension   . Hypothyroid   . IBS (irritable bowel syndrome)   . Insomnia   . Lupus ([place])   . Migraine   . RLS (restless legs syndrome)   . Skin cancer Had cancer removed in 2000  . Thyroid disease     Past Surgical History:  Procedure Laterality Date  . ABDOMINAL HYSTERECTOMY  2008  . APPENDECTOMY  1998  . thoracic tumor  03/1995   excision   Family History:  Family History  Problem Relation Age of Onset  . Sleep apnea Mother   . Irregular heart beat Mother        A fib  . Hypertension Mother   . Diabetes Mother   . Dementia Mother   . Pneumonia Father   . Sleep apnea Brother   .  Dementia Maternal Grandmother    Family Psychiatric  History: Not pertinent Social History:  Social History   Substance and Sexual Activity  Alcohol Use No     Social History   Substance and Sexual Activity  Drug Use Not Currently  . Types: Marijuana, Other-see comments   Comment: CBD oil, last used 2020    Social History   Socioeconomic History  . Marital status: Married    Spouse name: Araceli Bouche  . Number of children: 2  . Years of education: Not on file  . Highest education level: High school graduate  Occupational History    Comment: na  Tobacco Use  . Smoking status: Former Smoker    Packs/day: 2.00    Years: 30.00    Pack years: 60.00    Types: Cigarettes    Quit date: 06/23/2006    Years since quitting: 13.1  . Smokeless tobacco: Never Used  Vaping Use  . Vaping Use: Never used  Substance and Sexual Activity  . Alcohol use: No  . Drug use: Not Currently    Types: Marijuana, Other-see comments    Comment: CBD oil, last used 2020  . Sexual activity: Not Currently  Other Topics Concern  . Not on file  Social History Narrative   Lives with spouse   Caffeine-tea 3-4 weekly   Social Determinants of Health   Financial Resource Strain:   . Difficulty of Paying Living Expenses:   Food Insecurity:   .  Worried About Charity fundraiser in the Last Year:   . Arboriculturist in the Last Year:   Transportation Needs:   . Film/video editor (Medical):   Marland Kitchen Lack of Transportation (Non-Medical):   Physical Activity:   . Days of Exercise per Week:   . Minutes of Exercise per Session:   Stress:   . Feeling of Stress :   Social Connections:   . Frequency of Communication with Friends and Family:   . Frequency of Social Gatherings with Friends and Family:   . Attends Religious Services:   . Active Member of Clubs or Organizations:   . Attends Archivist Meetings:   Marland Kitchen Marital Status:     Hospital Course:  On 08/31/19- Patient was admitted and  examined. Patient is a 59 year old female with the above-stated past psychiatric history who was admitted secondary to concern for possible suicide attempt by carbon monoxide poisoning.  She stated that she is not actively taking any antidepressant medicine.  She has been prescribed Ambien and Seroquel for sleep from Dr. Toy Care.  The dosage of Seroquel she reports is 25 mg and she is able to take 2 to 3 tablets at at bedtime.  She is also prescribed lorazepam 1 mg p.o. twice daily and that will be continued.  She was started on Zyprexa 10 mg p.o. daily, and that is been switched to nightly on admission. We continued the Ambien on a as needed basis. Of her admission laboratories revealed a blood sugar of 131 on the morning of 7/14.  Her last electrolyte panel was from 7/14.  Her creatinine was mildly elevated 1.2, and her AST was elevated at 61.  The rest of her electrolytes were essentially normal.  Her total cholesterol was elevated at 240 and her triglycerides were elevated to 73.  Her CBC was normal including her platelets.  PT and INR were normal.  Her acetaminophen was less than 10, salicylate was less than 7.  Her TSH was 0.383.  She does have a history of hypothyroidism and is on levothyroxine.  HIV was negative.  Drug screen on admission was positive for benzodiazepines.   Next day Lorazepam was changed to 1 mg PO QHS as per patient's request for anxiety and insomnia. On 08/30/19-she admitted the possibility of intentional suicide attempt. Denies depression but angry & agitated about not able to leave hospital to go home. Threatened  to stop all her medications but with encouragement agreed to the new plan of starting Remeron 7.5 mg QHS daily for depression and insomnia.  During the course of his hospitalization, the 15-minute checks were adequate to ensure patient's safety. Patient did not display any dangerous, violent or suicidal behavior on the unit. She interacted with patients &staff appropriately  and participated appropriately in the group therapy sessions. Her medications were addressed &adjusted to meet his needs. At the time of discharge,patient is not reporting any acute suicidal ideation &feels more confident about her self-care and managing suicidal thoughts/mental health. Denies homicidal ideations. She stated to go live with family for a while. She is going to discuss it further with her sister.Education and supportive counseling provided. She was able to engage in safety planning including plan to return to [facility] or contact emergency services if he feels unable to maintain her own safety or the safety of others. Pt had no further questions, comments, or concerns.     Physical Findings: AIMS: Facial and Oral Movements Muscles of Facial Expression: None,  normal Lips and Perioral Area: None, normal Jaw: None, normal Tongue: None, normal,Extremity Movements Upper (arms, wrists, hands, fingers): None, normal Lower (legs, knees, ankles, toes): None, normal, Trunk Movements Neck, shoulders, hips: None, normal, Overall Severity Severity of abnormal movements (highest score from questions above): None, normal Incapacitation due to abnormal movements: None, normal Patient's awareness of abnormal movements (rate only patient's report): No Awareness, Dental Status Current problems with teeth and/or dentures?: No Does patient usually wear dentures?: No  CIWA:    COWS:     Musculoskeletal: Strength & Muscle Tone: within normal limits Gait & Station: normal Patient leans: N/A  Psychiatric Specialty Exam: Physical Exam Constitutional:      Appearance: Normal appearance.  Musculoskeletal:     Cervical back: Normal range of motion.     Comments: arthralgias   Neurological:     Mental Status: She is alert and oriented to person, place, and time.  Psychiatric:        Behavior: Behavior normal.        Thought Content: Thought content normal.        Judgment: Judgment normal.      Comments: Insomnia      Review of Systems  Blood pressure (!) 134/101, pulse (!) 106, temperature 97.6 F (36.4 C), temperature source Oral, resp. rate 16, height 5\' 8"  (1.727 m), weight 81.6 kg, SpO2 99 %.Body mass index is 27.37 kg/m.  General Appearance: Casual  Eye Contact:  Good  Speech:  Clear and Coherent  Volume:  Normal  Mood:  Dysphoric  Affect:  Appropriate  Thought Process:  Coherent  Orientation:  Full (Time, Place, and Person)  Thought Content:  WDL  Suicidal Thoughts:  No  Homicidal Thoughts:  No  Memory:  Immediate;   Fair Recent;   Fair  Judgement:  Fair  Insight:  Fair  Psychomotor Activity:  Normal  Concentration:  Concentration: Good and Attention Span: Good  Recall:  [place] of Knowledge:  Fair  Language:  Good  Akathisia:  No  Handed:  Right  AIMS (if indicated):     Assets:  Communication Skills Desire for Improvement Resilience  ADL's:  Intact  Cognition:  WNL  Sleep:  Number of Hours: 3.5        Has this patient used any form of tobacco in the last 30 days? (Cigarettes, Smokeless Tobacco, Cigars, and/or Pipes) No  Blood Alcohol level:  Lab Results  Component Value Date   ETH <10 08/26/2019   [facility]  06/10/2009    <5        LOWEST DETECTABLE LIMIT FOR SERUM ALCOHOL IS 5 mg/dL FOR MEDICAL PURPOSES ONLY    Metabolic Disorder Labs:  No results found for: HGBA1C, MPG No results found for: PROLACTIN Lab Results  Component Value Date   CHOL 240 (H) 08/27/2019   TRIG 273 (H) 08/27/2019   HDL 44 08/27/2019   CHOLHDL 5.5 08/27/2019   VLDL 55 (H) 08/27/2019   LDLCALC 141 (H) 08/27/2019    See Psychiatric Specialty Exam and Suicide Risk Assessment completed by Attending Physician prior to discharge.  Discharge destination:  Home  Is patient on multiple antipsychotic therapies at discharge:  No   Has Patient had three or more failed trials of antipsychotic monotherapy by history:  No  Recommended Plan for Multiple  Antipsychotic Therapies: NA  Discharge Instructions    Discharge instructions   Complete by: As directed    Patient is instructed to take all prescribed medications as recommended.  Report any side effects or adverse reactions to your outpatient psychiatrist. Patient is instructed to abstain from alcohol and illegal drugs while on prescription medications. In the event of worsening symptoms, patient is instructed to call the crisis hotline, 911, or go to the nearest emergency department for evaluation and treatment.     Allergies as of 08/31/2019      Reactions   Erythromycin Anaphylaxis   Chest pain   Morphine And Related Nausea And Vomiting   Not given   Promethazine Nausea And Vomiting   "made her sick"   Simvastatin Other (See Comments)   Muscle aches      Medication List    STOP taking these medications   acetaminophen 325 MG tablet Commonly known as: TYLENOL   azelastine 0.1 % nasal spray Commonly known as: ASTELIN   docusate sodium 100 MG capsule Commonly known as: Colace   doxepin 25 MG capsule Commonly known as: SINEQUAN   gabapentin 100 MG capsule Commonly known as: Neurontin   multivitamin capsule   omeprazole 20 MG capsule Commonly known as: PRILOSEC   polyethylene glycol 17 g packet Commonly known as: MiraLax   pravastatin 10 MG tablet Commonly known as: PRAVACHOL   Probiotic Caps   tiZANidine 4 MG tablet Commonly known as: ZANAFLEX   zolpidem 12.5 MG CR tablet Commonly known as: AMBIEN CR Replaced by: zolpidem 5 MG tablet     TAKE these medications     Indication  fluconazole 150 MG tablet Commonly known as: DIFLUCAN Take 1 tablet (150 mg total) by mouth once a week. Start taking on: September 04, 2019  Indication: Vaginal yeast infection   levothyroxine 75 MCG tablet Commonly known as: SYNTHROID Take 1 tablet (75 mcg total) by mouth daily at 6 (six) AM. Start taking on: September 01, 2019 What changed:   medication strength  how much  to take  when to take this  Indication: Underactive Thyroid   LORazepam 1 MG tablet Commonly known as: ATIVAN Take 1 tablet (1 mg total) by mouth at bedtime. What changed:   when to take this  reasons to take this  Indication: Trouble Sleeping   olanzapine zydis 15 MG disintegrating tablet Commonly known as: ZYPREXA Take 1 tablet (15 mg total) by mouth at bedtime.  Indication: Mood   zolpidem 5 MG tablet Commonly known as: AMBIEN Take 1 tablet (5 mg total) by mouth at bedtime as needed for sleep. Replaces: zolpidem 12.5 MG CR tablet  Indication: Trouble Sleeping       Follow-up Information    Chucky May, MD Follow up on 09/25/2019.   Specialty: Psychiatry Why: You have an appointment on 09/25/19 at 5:30 pm.  This appointment will be Virtual. Contact information: [address] [place] [address] [phone]               Follow-up recommendations:  Activity:  Normal Diet:  Noraml  Comments:  Prescriptions given at discharge. Patient agreeable to plan.Given opportunity to ask questions. Appears to feel comfortable with discharge denies any current suicidal or homicidal thought. Patient is also instructed prior to discharge to: Take all medications as prescribed by his/her mental healthcare provider. Report any adverse effects and or reactions from the medicines to his/her outpatient provider promptly. Patient has been instructed & cautioned: To not engage in alcohol and or illegal drug use while on prescription medicines. In the event of worsening symptoms, patient is instructed to call the crisis hotline, 911 and or go to  the nearest ED for appropriate evaluation and treatment of symptoms.To follow-up with his/her primary care provider for your other medical issues, concerns and or health care needs.  Signed: Honor Junes, MD 08/31/2019, 10:34 AM

## 2019-08-31 NOTE — Progress Notes (Signed)
Spiritual care group on grief and loss facilitated by chaplain Socorro Kanitz  Group Goal:  Support / Education around grief and loss Members engage in facilitated group support and psycho-social education.  Group Description:  Following introductions and group rules, group members engaged in facilitated group dialog and support around topic of loss, with particular support around experiences of loss in their lives. Group Identified types of loss (relationships / self / things) and identified patterns, circumstances, and changes that precipitate losses. Reflected on thoughts / feelings around loss, normalized grief responses, and recognized variety in grief experience. Patient Progress: Did not attend  

## 2019-09-30 ENCOUNTER — Ambulatory Visit (INDEPENDENT_AMBULATORY_CARE_PROVIDER_SITE_OTHER): Payer: BC Managed Care – PPO | Admitting: Neurology

## 2019-09-30 DIAGNOSIS — G4733 Obstructive sleep apnea (adult) (pediatric): Secondary | ICD-10-CM

## 2019-09-30 DIAGNOSIS — R0683 Snoring: Secondary | ICD-10-CM

## 2019-09-30 DIAGNOSIS — F5101 Primary insomnia: Secondary | ICD-10-CM

## 2019-10-05 ENCOUNTER — Ambulatory Visit: Payer: BC Managed Care – PPO | Admitting: Diagnostic Neuroimaging

## 2019-10-05 ENCOUNTER — Other Ambulatory Visit: Payer: Self-pay

## 2019-10-05 ENCOUNTER — Encounter: Payer: Self-pay | Admitting: Diagnostic Neuroimaging

## 2019-10-05 VITALS — BP 155/102 | HR 105 | Ht 68.0 in | Wt 186.0 lb

## 2019-10-05 DIAGNOSIS — H02403 Unspecified ptosis of bilateral eyelids: Secondary | ICD-10-CM | POA: Diagnosis not present

## 2019-10-05 DIAGNOSIS — R531 Weakness: Secondary | ICD-10-CM | POA: Diagnosis not present

## 2019-10-05 NOTE — Progress Notes (Signed)
[facility]  PATIENT: Kathleen Sweeney DOB: 11/14/1960  REFERRING CLINICIAN: Maurice Small, MD HISTORY FROM: patient  REASON FOR VISIT: follow up   HISTORICAL  CHIEF COMPLAINT:  Chief Complaint  Patient presents with  . Hosptial FU, MRI results, insomnia    rm 6 "they saw a spot on MRI"    HISTORY OF PRESENT ILLNESS:   UPDATE (10/05/19, VRP): Since last visit, patient was hospitalized for intentional carbon monoxide poisoning in July 2021. Neuro symptoms improved. Awaiting psychiatry clinic follow up with Dr. Toy Care.   UPDATE (06/01/19, VRP): Since last visit, doing better on Mestinon 30 mg 3 times a day.  Ptosis, speech and general weakness are improved.  AchR antibody and anti-MuSK antibody testing performed and were negative.  Patient continues to struggle with insomnia, depression and anxiety.  She is seeing Dr Toy Care from psychiatry for these issues.  Patient also has some unexplained pain issues, concerned she may have fibromyalgia. Also having memory loss issues concerned about dementia.   PRIOR HPI: 59 year old female here for evaluation of ptosis.  Patient has had at least 2 years of gradual onset progressive drooping eyelids, left worse than right.  Patient went to eye doctor for routine checkup and was complaining of ptosis.  She was referred to another eye doctor who raised possibility of myasthenia gravis.  Patient referred here for further evaluation.  Patient also been having lower extremity weakness, left worse than right.  She is also having some slurred speech and trouble swallowing.  No specific triggering or aggravating factors.  Symptoms slightly fluctuate.  She has some blurred vision as well.  Sometimes her ptosis obstructs her vision.  No shortness of breath.    REVIEW OF SYSTEMS: Full 14 system review of systems performed and negative with exception of: As per HPI.  ALLERGIES: Allergies  Allergen Reactions  . Erythromycin Anaphylaxis    Chest  pain  . Morphine And Related Nausea And Vomiting    Not given  . Promethazine Nausea And Vomiting    "made her sick"  . Simvastatin Other (See Comments)    Muscle aches    HOME MEDICATIONS: Outpatient Medications Prior to Visit  Medication Sig Dispense Refill  . amoxicillin-clavulanate (AUGMENTIN) 875-125 MG tablet SMARTSIG:1 Tablet(s) By Mouth Every 12 Hours    . levothyroxine (SYNTHROID) 75 MCG tablet Take 1 tablet (75 mcg total) by mouth daily at 6 (six) AM. 30 tablet 0  . LORazepam (ATIVAN) 1 MG tablet Take 1 tablet (1 mg total) by mouth at bedtime. 1 tablet 0  . OLANZapine zydis (ZYPREXA) 15 MG disintegrating tablet Take 1 tablet (15 mg total) by mouth at bedtime. 30 tablet 0  . ondansetron (ZOFRAN) 8 MG tablet Take 8 mg by mouth 2 (two) times daily as needed.    . pravastatin (PRAVACHOL) 10 MG tablet Take 10 mg by mouth daily.    Marland Kitchen tiZANidine (ZANAFLEX) 4 MG tablet Take 8-12 mg by mouth at bedtime. As needed    . zolpidem (AMBIEN) 5 MG tablet Take 1 tablet (5 mg total) by mouth at bedtime as needed for sleep. 15 tablet 0  . fluconazole (DIFLUCAN) 150 MG tablet Take 1 tablet (150 mg total) by mouth once a week. (Patient not taking: Reported on 10/05/2019) 3 tablet 0   No facility-administered medications prior to visit.    PAST MEDICAL HISTORY: Past Medical History:  Diagnosis Date  . CKD (chronic kidney disease)   . Depression   . Facial droop   .  Fibromyalgia   . Hyperlipidemia   . Hypertension   . Hypothyroid   . IBS (irritable bowel syndrome)   . Insomnia   . Lupus ([place])   . Migraine   . RLS (restless legs syndrome)   . Skin cancer Had cancer removed in 2000  . Thyroid disease     PAST SURGICAL HISTORY: Past Surgical History:  Procedure Laterality Date  . ABDOMINAL HYSTERECTOMY  2008  . APPENDECTOMY  1998  . thoracic tumor  03/1995   excision    FAMILY HISTORY: Family History  Problem Relation Age of Onset  . Sleep apnea Mother   . Irregular heart beat  Mother        A fib  . Hypertension Mother   . Diabetes Mother   . Dementia Mother   . Pneumonia Father   . Sleep apnea Brother   . Dementia Maternal Grandmother     SOCIAL HISTORY: Social History   Socioeconomic History  . Marital status: Married    Spouse name: Araceli Bouche  . Number of children: 2  . Years of education: Not on file  . Highest education level: High school graduate  Occupational History    Comment: na  Tobacco Use  . Smoking status: Former Smoker    Packs/day: 2.00    Years: 30.00    Pack years: 60.00    Types: Cigarettes    Quit date: 06/23/2006    Years since quitting: 13.2  . Smokeless tobacco: Never Used  Vaping Use  . Vaping Use: Never used  Substance and Sexual Activity  . Alcohol use: No  . Drug use: Not Currently    Types: Marijuana, Other-see comments    Comment: CBD oil, last used 2020  . Sexual activity: Not Currently  Other Topics Concern  . Not on file  Social History Narrative   Lives with spouse   Caffeine-tea 3-4 weekly   Social Determinants of Health   Financial Resource Strain:   . Difficulty of Paying Living Expenses: Not on file  Food Insecurity:   . Worried About Charity fundraiser in the Last Year: Not on file  . Ran Out of Food in the Last Year: Not on file  Transportation Needs:   . Lack of Transportation (Medical): Not on file  . Lack of Transportation (Non-Medical): Not on file  Physical Activity:   . Days of Exercise per Week: Not on file  . Minutes of Exercise per Session: Not on file  Stress:   . Feeling of Stress : Not on file  Social Connections:   . Frequency of Communication with Friends and Family: Not on file  . Frequency of Social Gatherings with Friends and Family: Not on file  . Attends Religious Services: Not on file  . Active Member of Clubs or Organizations: Not on file  . Attends Archivist Meetings: Not on file  . Marital Status: Not on file  Intimate Partner Violence:   . Fear of  Current or Ex-Partner: Not on file  . Emotionally Abused: Not on file  . Physically Abused: Not on file  . Sexually Abused: Not on file     PHYSICAL EXAM  GENERAL EXAM/CONSTITUTIONAL: Vitals:  Vitals:   10/05/19 1533  BP: (!) 155/102  Pulse: (!) 105  Weight: 186 lb (84.4 kg)  Height: 5\' 8"  (1.727 m)   Body mass index is 28.28 kg/m. Wt Readings from Last 3 Encounters:  10/05/19 186 lb (84.4 kg)  08/26/19 183 lb  6.8 oz (83.2 kg)  08/13/19 178 lb 5.6 oz (80.9 kg)    Patient is in no distress; well developed, nourished and groomed; neck is supple  TEARFUL  CARDIOVASCULAR:  Examination of carotid arteries is normal; no carotid bruits  Regular rate and rhythm, no murmurs  Examination of peripheral vascular system by observation and palpation is normal  EYES:  Ophthalmoscopic exam of optic discs and posterior segments is normal; no papilledema or hemorrhages No exam data present  MUSCULOSKELETAL:  Gait, strength, tone, movements noted in Neurologic exam below  NEUROLOGIC: MENTAL STATUS:  No flowsheet data found.  awake, alert, oriented to person, place and time  recent and remote memory intact  normal attention and concentration  language fluent, comprehension intact, naming intact  fund of knowledge appropriate  CRANIAL NERVE:   2nd - no papilledema on fundoscopic exam  2nd, 3rd, 4th, 6th - pupils equal and reactive to light, visual fields full to confrontation, extraocular muscles intact, no nystagmus; NO PTOSIS  5th - facial sensation symmetric  7th - facial strength symmetric  8th - hearing intact  9th - palate elevates symmetrically, uvula midline  11th - shoulder shrug symmetric  12th - tongue protrusion midline  MOTOR:   normal bulk and tone, full strength in the BUE, BLE  SENSORY:   normal and symmetric to light touch  COORDINATION:   finger-nose-finger, fine finger movements normal  REFLEXES:   deep tendon reflexes present  and symmetric  GAIT/STATION:   narrow based gait; CAUTIOUS UNSTEADY GAIT     DIAGNOSTIC DATA (LABS, IMAGING, TESTING) - I reviewed patient records, labs, notes, testing and imaging myself where available.  Lab Results  Component Value Date   WBC 8.3 08/26/2019   HGB 13.6 08/26/2019   HCT 40.0 08/26/2019   MCV 85.4 08/26/2019   PLT 242 08/26/2019      Component Value Date/Time   NA 142 08/26/2019 0033   NA 138 03/02/2019 1200   K 4.8 08/26/2019 0033   CL 104 08/26/2019 0033   CO2 25 08/26/2019 0021   GLUCOSE 136 (H) 08/26/2019 0033   BUN 15 08/26/2019 0033   BUN 10 03/02/2019 1200   CREATININE 1.20 (H) 08/26/2019 0033   CALCIUM 9.3 08/26/2019 0021   PROT 6.6 08/26/2019 0021   PROT 7.1 03/02/2019 1200   ALBUMIN 3.3 (L) 08/26/2019 0021   ALBUMIN 4.2 03/02/2019 1200   AST 61 (H) 08/26/2019 0021   ALT 29 08/26/2019 0021   ALKPHOS 84 08/26/2019 0021   BILITOT 0.9 08/26/2019 0021   BILITOT 0.4 03/02/2019 1200   GFRNONAA 46 (L) 08/26/2019 0021   GFRAA 53 (L) 08/26/2019 0021   Lab Results  Component Value Date   CHOL 240 (H) 08/27/2019   HDL 44 08/27/2019   LDLCALC 141 (H) 08/27/2019   TRIG 273 (H) 08/27/2019   CHOLHDL 5.5 08/27/2019   No results found for: HGBA1C No results found for: VITAMINB12 Lab Results  Component Value Date   TSH 0.383 08/10/2019    AchR ab - negative  Anti-musk ab - negative  08/26/19 MRI brain  1. No acute finding.  No MR findings of carbon monoxide poisoning. 2. Mild chronic small vessel ischemia.   ASSESSMENT AND PLAN  59 y.o. year old female here with gradual onset progressive ptosis, weakness of lower extremities, slurred speech and trouble swallowing since 2018.  Signs and symptoms initially concerning for myasthenia gravis, but antibody testing and EMG was negative.   Dx:  1. Weakness  2. Ptosis of both eyelids      PLAN:  BILATERAL PTOSIS, WEAKNESS, DYSARTHRIA, DYSPHAGIA (initialyl considered sero-negative  myasthenia gravis but EMG/NCS unremarkable; also not much benefit with empiric trial of mestinon; therefore, MG is not likely diagnosis; MRI brain also unremarkable) - monitor for now  INSOMNIA / DEPRESSION / ANXIETY  - follow up with Dr. Toy Care  No follow-ups on file.    [name], MD 04/07/16, 0:97 PM Certified in Neurology, Neurophysiology and Neuroimaging  [facility] [address] [place], [place] [address] [phone]

## 2019-10-09 DIAGNOSIS — R0683 Snoring: Secondary | ICD-10-CM | POA: Insufficient documentation

## 2019-10-09 DIAGNOSIS — G4733 Obstructive sleep apnea (adult) (pediatric): Secondary | ICD-10-CM | POA: Insufficient documentation

## 2019-10-09 DIAGNOSIS — F3189 Other bipolar disorder: Secondary | ICD-10-CM | POA: Diagnosis not present

## 2019-10-09 NOTE — Addendum Note (Signed)
Addended by: Larey Seat on: 10/09/2019 01:13 PM   Modules accepted: Orders

## 2019-10-09 NOTE — Procedures (Signed)
  Patient Information     First Name: Kathleen Last Name: Sweeney ID: [id number]  Birth Date: 02-28-2060 Age: 59 Gender: Female  Referring Provider: Maurice Small, MD BMI: 27.1 (W=178 lbs, H=5' 8'')  Neck Circ.:  15 '' Epworth:  1/24   Sleep Study Information    Study Date: 09/30/19 S/H/A Version: 001.001.001.001 / 4.1.1528 / 77  History:    [name] is a right-handed Caucasian female with INSOMNIA, a chronic sleep disorder.   She has a medical history of CKD (chronic kidney disease), Depression, Facial droop, Fibromyalgia, Hyperlipidemia, Hypertension, Hypothyroid, IBS (irritable bowel syndrome), Insomnia, Lupus ([place]), Migraine, RLS (restless legs syndrome), Weakness, Skin cancer (Had cancer removed in 2000), and Thyroid disease.       Summary & Diagnosis:    This HST indicates the presence of moderate Obstructive Sleep Apnea at AHI of 22.3/h and with REM sleep dependent apnea - REM AHI was 30.6/h. Supine sleep contributed to more apnea and loud snoring.   Recommendations:     REM sleep accentuated AHI will be responding best to CPAP therapy. avoiding supine sleep is also highly recommended.  I like for this patient to start on auto PAP- therapy with a setting from 6-16 cm water, 2 cm EPR, heated humidification and with a fitting for an interface which allows non-supine sleep.   Interpreting Physician: Mack Hook             Sleep Summary  Oxygen Saturation Statistics   Start Study Time: End Study Time: Total Recording Time:          11:29:06 PM 8:36:08 AM 9 h, 7 min  Total Sleep Time % REM of Sleep Time:  7 h, 26 min  23.0    Mean: 93 Minimum: 88 Maximum: 98  Mean of Desaturations Nadirs (%):   92  Oxygen Desaturation. %: 4-9 10-20 >20 Total  Events Number Total  60 100.0  0 0.0  0 0.0  60 100.0  Oxygen Saturation: <90 <=88 <85 <80 <70  Duration (minutes): Sleep % 0.1 0.0 0.0 0.0 0.0 0.0 0.0 0.0 0.0 0.0     Respiratory Indices      Total Events  REM NREM All Night  pRDI:  121  pAHI:  121 ODI:  60  pAHIc:  3  % CSR: 0.0 30.6 30.6 16.5 1.2 20.8 20.8 10.1 0.5 22.3 22.3 11.1 0.6       Pulse Rate Statistics during Sleep (BPM)      Mean: 57 Minimum: 49 Maximum: 93    Indices are calculated using technically valid sleep time of 5 h, 25 min. Central-Indices are calculated using technically valid sleep time of 4 s, 57 min. pRDI/pAHI are calculated using oxi desaturations ? 3%  Body Position Statistics  Position Supine Prone Right Left Non-Supine  Sleep (min) 338.0 98.0 10.0 0.0 108.0  Sleep % 75.8 22.0 2.2 0.0 24.2  pRDI 25.8 15.8 N/A N/A 14.9  pAHI 25.8 15.8 N/A N/A 14.9  ODI 15.2 2.5 N/A N/A 2.3     Snoring Statistics Snoring Level (dB) >40 >50 >60 >70 >80 >Threshold (45)  Sleep (min) 380.1 157.9 9.2 0.0 0.0 245.1  Sleep % 85.2 35.4 2.1 0.0 0.0 54.9    Mean: 48 dB

## 2019-10-09 NOTE — Progress Notes (Signed)
Cc Kathleen Sweeney Cc Kathleen Sweeney  Summary & Diagnosis:   This HST indicates the presence of moderate Obstructive Sleep  Apnea at AHI of 22.3/h and with REM sleep dependent apnea - REM  AHI was 30.6/h. Supine sleep contributed to more apnea and loud  snoring.   Recommendations:    REM sleep accentuated AHI will be responding best to CPAP  therapy. Avoiding supine sleep is also highly recommended.   I like for this patient to start on auto PAP- therapy with a  setting from 6-16 cm water, 2 cm EPR, heated humidification and  with a fitting for an interface which allows non-supine sleep.   Interpreting Physician: [name] Eligha Kmetz,MD

## 2019-10-12 ENCOUNTER — Telehealth: Payer: Self-pay | Admitting: Neurology

## 2019-10-12 NOTE — Telephone Encounter (Signed)
-----   Message from Larey Seat, MD sent at 10/09/2019  1:13 PM EDT ----- Cc Nila Nephew  Summary & Diagnosis:   This HST indicates the presence of moderate Obstructive Sleep  Apnea at AHI of 22.3/h and with REM sleep dependent apnea - REM  AHI was 30.6/h. Supine sleep contributed to more apnea and loud  snoring.   Recommendations:    REM sleep accentuated AHI will be responding best to CPAP  therapy. Avoiding supine sleep is also highly recommended.   I like for this patient to start on auto PAP- therapy with a  setting from 6-16 cm water, 2 cm EPR, heated humidification and  with a fitting for an interface which allows non-supine sleep.   Interpreting Physician: [name] [name],MD

## 2019-10-12 NOTE — Telephone Encounter (Signed)
I called pt. I advised pt that [name] reviewed their sleep study results and found that pt moderate OSA. [name] recommends that pt starts auto CPAP. I reviewed PAP compliance expectations with the pt. Pt is agreeable to starting a CPAP. I advised pt that an order will be sent to a DME, [facility] and [facility] will call the pt within about one week after they file with the pt's insurance. [facility]) will show the pt how to use the machine, fit for masks, and troubleshoot the CPAP if needed. A follow up appt was made for insurance purposes with Ward Givens, NP on 01/14/20 at 11:30 am. Pt verbalized understanding to arrive 15 minutes early and bring their CPAP. A letter with all of this information in it will be mailed to the pt as a reminder. I verified with the pt that the address we have on file is correct. Pt verbalized understanding of results. Pt had no questions at this time but was encouraged to call back if questions arise. I have sent the order to [facility]) and have received confirmation that they have received the order.

## 2019-10-13 DIAGNOSIS — K5792 Diverticulitis of intestine, part unspecified, without perforation or abscess without bleeding: Secondary | ICD-10-CM | POA: Diagnosis not present

## 2019-10-26 ENCOUNTER — Other Ambulatory Visit (HOSPITAL_COMMUNITY): Payer: Self-pay | Admitting: Gastroenterology

## 2019-10-26 ENCOUNTER — Other Ambulatory Visit: Payer: Self-pay | Admitting: Gastroenterology

## 2019-10-26 DIAGNOSIS — K5732 Diverticulitis of large intestine without perforation or abscess without bleeding: Secondary | ICD-10-CM | POA: Diagnosis not present

## 2019-10-26 DIAGNOSIS — R1084 Generalized abdominal pain: Secondary | ICD-10-CM | POA: Diagnosis not present

## 2019-10-30 ENCOUNTER — Other Ambulatory Visit: Payer: Self-pay

## 2019-10-30 ENCOUNTER — Ambulatory Visit (HOSPITAL_COMMUNITY)
Admission: RE | Admit: 2019-10-30 | Discharge: 2019-10-30 | Disposition: A | Discharge status: Home / Self Care | Payer: BC Managed Care – PPO | Source: Ambulatory Visit | Attending: Gastroenterology | Admitting: Gastroenterology

## 2019-10-30 DIAGNOSIS — R1084 Generalized abdominal pain: Secondary | ICD-10-CM | POA: Diagnosis not present

## 2019-10-30 DIAGNOSIS — R109 Unspecified abdominal pain: Secondary | ICD-10-CM | POA: Diagnosis not present

## 2019-10-30 DIAGNOSIS — K5732 Diverticulitis of large intestine without perforation or abscess without bleeding: Secondary | ICD-10-CM | POA: Diagnosis not present

## 2019-10-30 LAB — POCT I-STAT CREATININE: Creatinine, Ser: 1.1 mg/dL — ABNORMAL HIGH (ref 0.44–1.00)

## 2019-10-30 IMAGING — CT CT ABD-PELV W/ CM
2 of 5 series · 15 of 46 positions shown, 17 images · IV contrast (APPLIED)
Comparison: [DATE]

CLINICAL DATA: Sigmoid diverticulitis, recurrent episodes,
abdominal pain

EXAM:
CT ABDOMEN AND PELVIS WITH CONTRAST
TECHNIQUE: Multidetector CT imaging of the abdomen and pelvis was performed
using the standard protocol following bolus administration of
intravenous contrast.
CONTRAST:  100mL OMNIPAQUE IOHEXOL 300 MG/ML SOLN, additional oral
enteric contrast

[Series 2: axial st · axial · 0.96mm/px · z∈[+1039,+1489]mm · 12 of 104 slices shown, 14 images]
[im 7/104  soft-tissue]
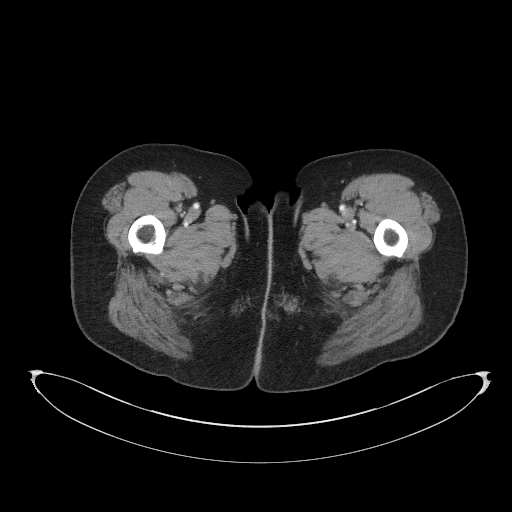
[im 7/104  bone]
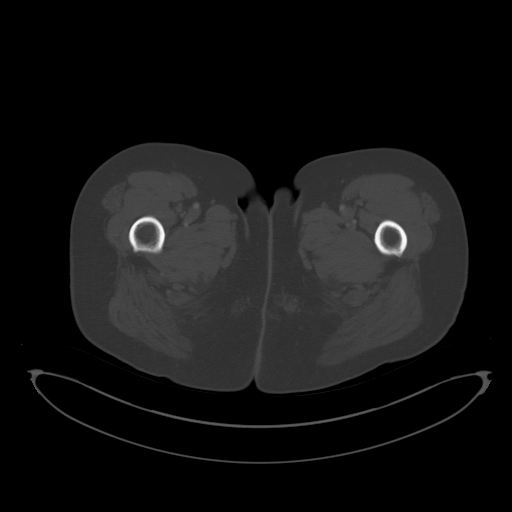
[im 19/104  soft-tissue]
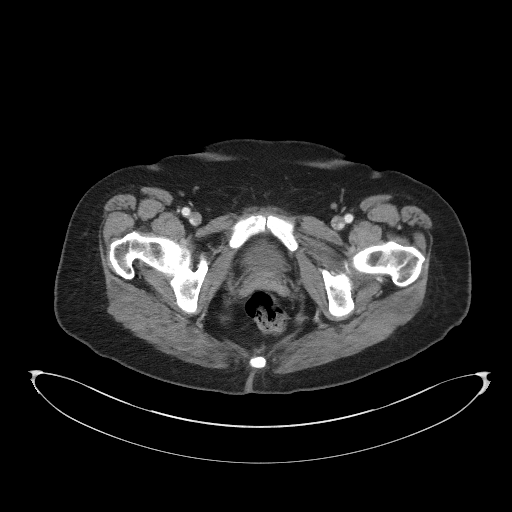
[im 25/104  soft-tissue]
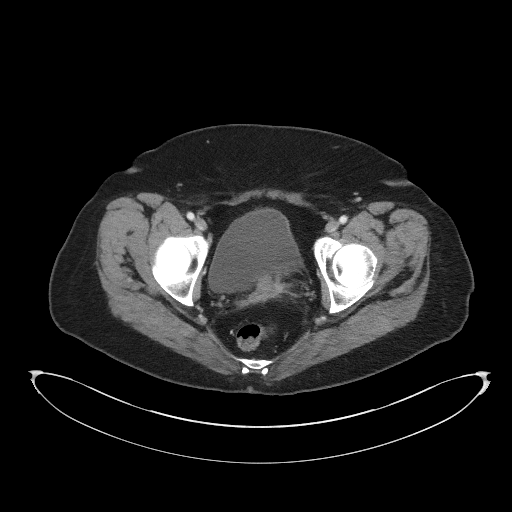
[im 31/104  soft-tissue]
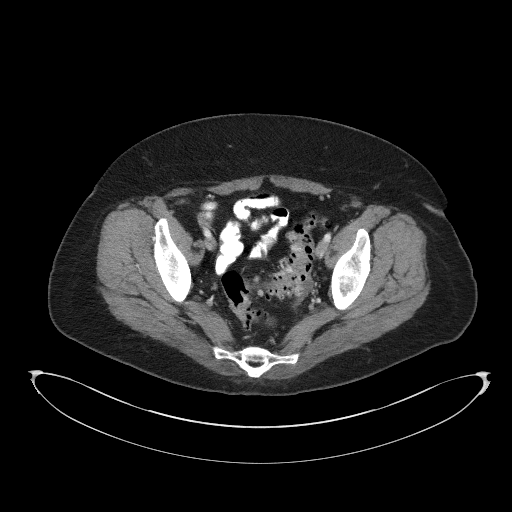
[im 43/104  soft-tissue]
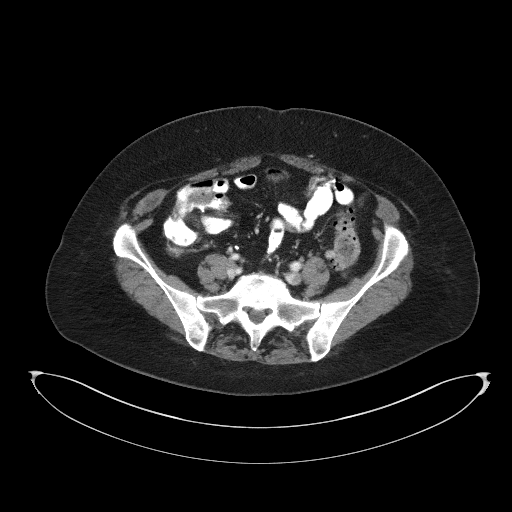
[im 49/104  soft-tissue]
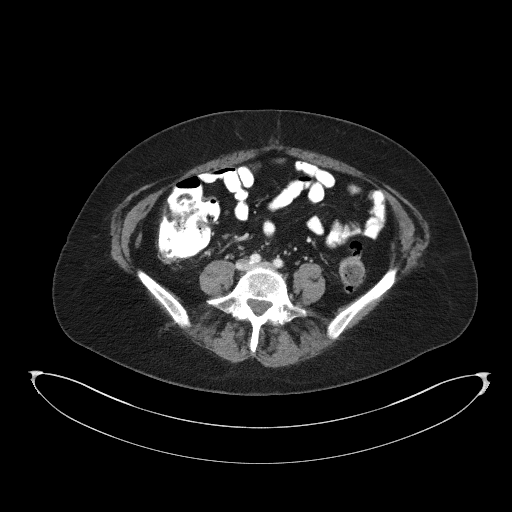
[im 55/104  soft-tissue]
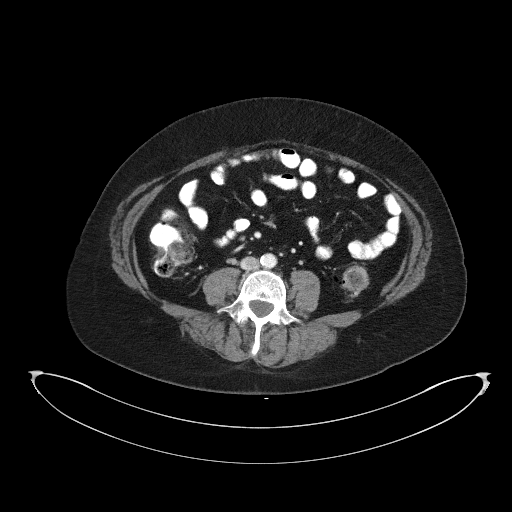
[im 67/104  soft-tissue]
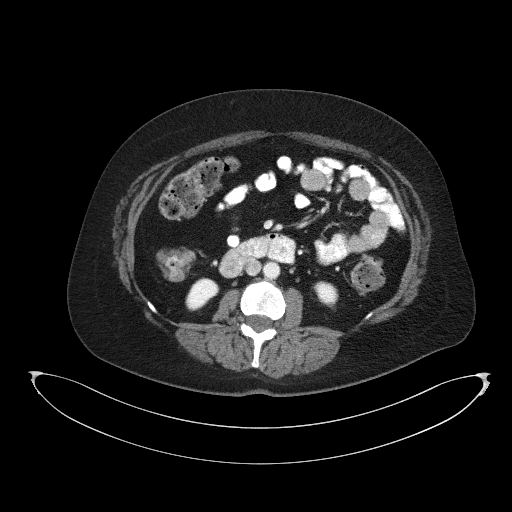
[im 73/104  soft-tissue]
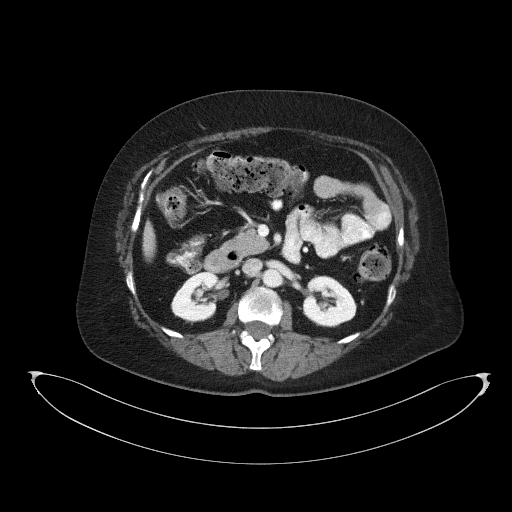
[im 73/104  bone]
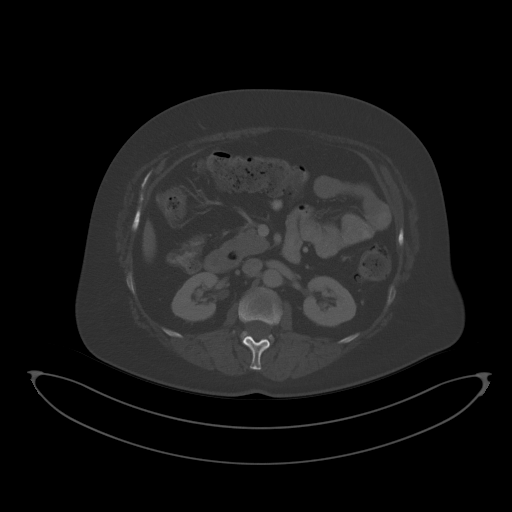
[im 79/104  soft-tissue]
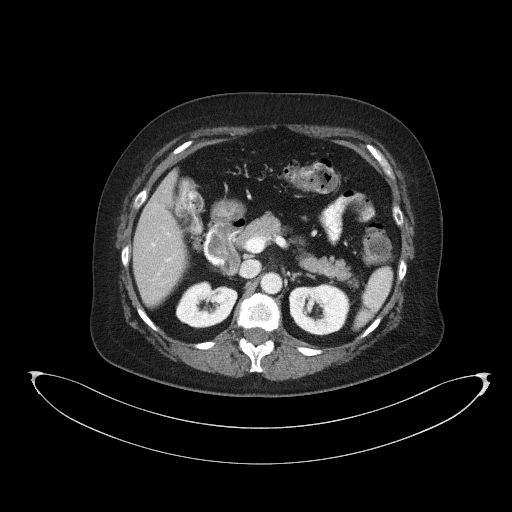
[im 91/104  soft-tissue]
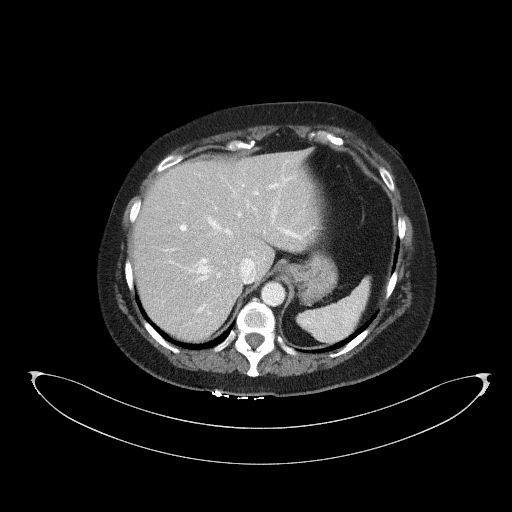
[im 97/104  soft-tissue]
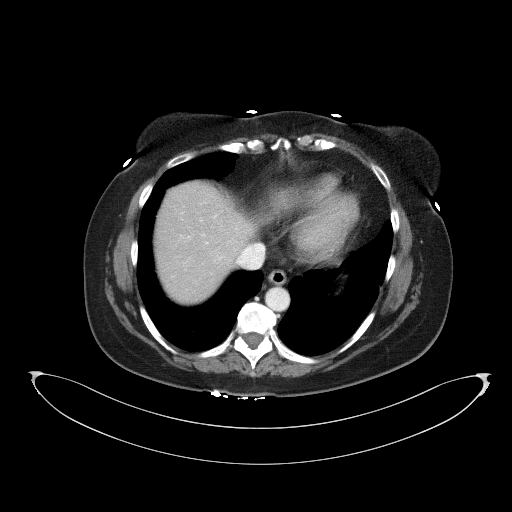

[Series 5: coronal st · coronal · 0.71mm/px · 3 of 101 slices shown]
[im 34/101  soft-tissue]
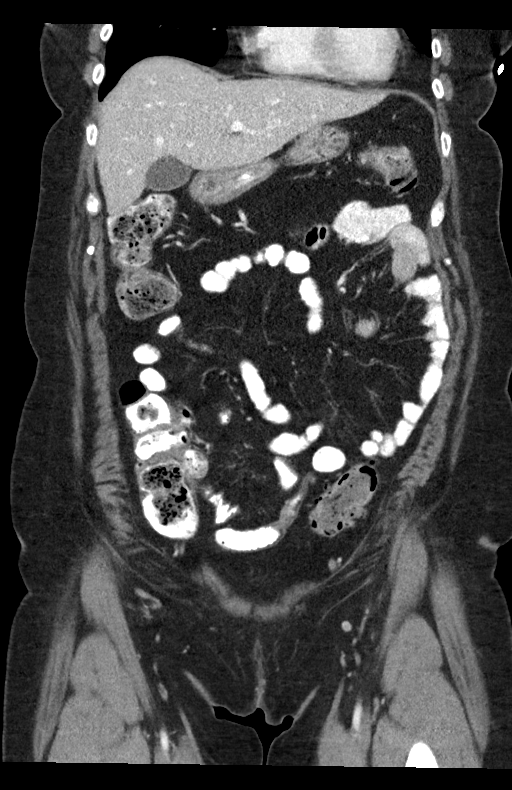
[im 45/101  soft-tissue]
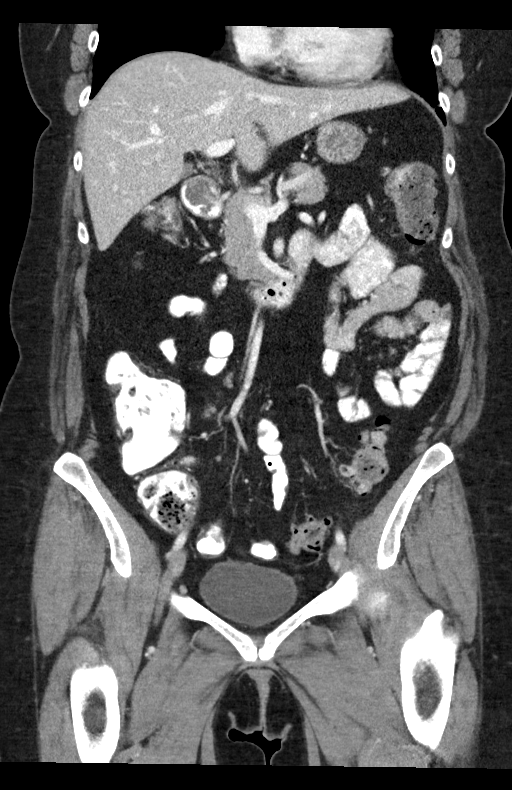
[im 56/101  soft-tissue]
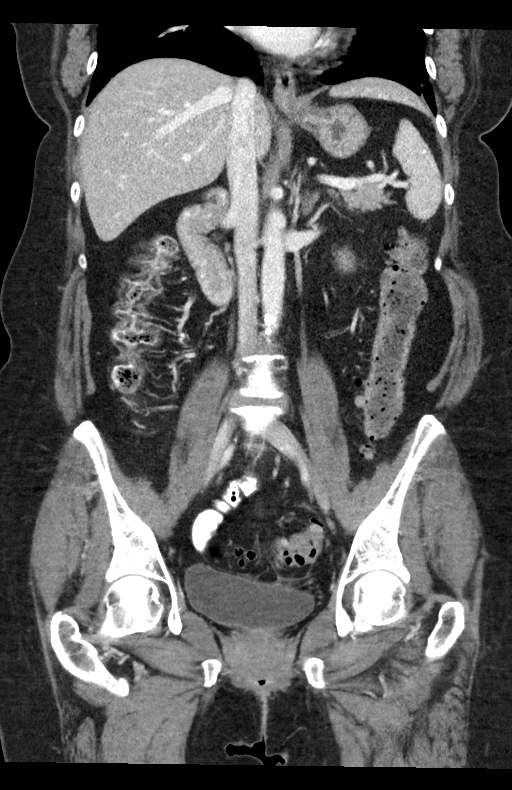

[15 of 46 positions shown; findings below may reference images not displayed]

FINDINGS: Lower chest: No acute abnormality.

Hepatobiliary: No solid liver abnormality is seen. No gallstones,
gallbladder wall thickening, or biliary dilatation.

Pancreas: Unremarkable. No pancreatic ductal dilatation or
surrounding inflammatory changes.

Spleen: Normal in size without significant abnormality.

Adrenals/Urinary Tract: Adrenal glands are unremarkable. Kidneys are
normal, without renal calculi, solid lesion, or hydronephrosis.
Bladder is unremarkable.

Stomach/Bowel: Stomach is within normal limits. Descending and
sigmoid diverticulosis. There is mild, persistent fat stranding
about the inferior aspect of the sigmoid (series 2, image 79), which
is however significantly reduced compared to prior examination dated
[DATE].

Vascular/Lymphatic: Aortic atherosclerosis. No enlarged abdominal or
pelvic lymph nodes.

Reproductive: Status post hysterectomy.

Other: No abdominal wall hernia or abnormality. Small volume fluid
in the low pelvis adjacent to the sigmoid (series 2, image 76)

Musculoskeletal: No acute or significant osseous findings.
IMPRESSION: 1. Descending and sigmoid diverticulosis. There is mild, persistent
fat stranding about the inferior aspect of the sigmoid , which is
however significantly reduced compared to prior examination dated
[DATE]. Findings are consistent with some degree of
diverticulitis and/or chronic post inflammatory change, the overall
course during the interval from prior examination unclear. No
evidence of complicating perforation or abscess at this time.
2. Small volume fluid in the low pelvis adjacent to the sigmoid,
likely reactive.
3. Status post hysterectomy.
4.  Aortic Atherosclerosis ([HM]-[HM]).

## 2019-10-30 MED ORDER — IOHEXOL 300 MG/ML  SOLN
100.0000 mL | Freq: Once | INTRAMUSCULAR | Status: AC | PRN
  Administered 2019-10-30: 100 mL via INTRAVENOUS

## 2019-12-03 DIAGNOSIS — E236 Other disorders of pituitary gland: Secondary | ICD-10-CM | POA: Diagnosis not present

## 2019-12-03 DIAGNOSIS — G4700 Insomnia, unspecified: Secondary | ICD-10-CM | POA: Diagnosis not present

## 2019-12-03 DIAGNOSIS — E039 Hypothyroidism, unspecified: Secondary | ICD-10-CM | POA: Diagnosis not present

## 2019-12-03 DIAGNOSIS — R232 Flushing: Secondary | ICD-10-CM | POA: Diagnosis not present

## 2019-12-31 DIAGNOSIS — Z1159 Encounter for screening for other viral diseases: Secondary | ICD-10-CM | POA: Diagnosis not present

## 2020-01-05 DIAGNOSIS — K5732 Diverticulitis of large intestine without perforation or abscess without bleeding: Secondary | ICD-10-CM | POA: Diagnosis not present

## 2020-01-05 DIAGNOSIS — K6389 Other specified diseases of intestine: Secondary | ICD-10-CM | POA: Diagnosis not present

## 2020-01-05 DIAGNOSIS — R1084 Generalized abdominal pain: Secondary | ICD-10-CM | POA: Diagnosis not present

## 2020-01-05 DIAGNOSIS — K640 First degree hemorrhoids: Secondary | ICD-10-CM | POA: Diagnosis not present

## 2020-01-05 DIAGNOSIS — K5730 Diverticulosis of large intestine without perforation or abscess without bleeding: Secondary | ICD-10-CM | POA: Diagnosis not present

## 2020-01-14 ENCOUNTER — Ambulatory Visit: Payer: BC Managed Care – PPO | Admitting: Adult Health

## 2020-01-22 DIAGNOSIS — K5730 Diverticulosis of large intestine without perforation or abscess without bleeding: Secondary | ICD-10-CM | POA: Diagnosis not present

## 2020-01-22 DIAGNOSIS — R140 Abdominal distension (gaseous): Secondary | ICD-10-CM | POA: Diagnosis not present

## 2020-01-22 DIAGNOSIS — K5900 Constipation, unspecified: Secondary | ICD-10-CM | POA: Diagnosis not present

## 2020-01-22 DIAGNOSIS — R1084 Generalized abdominal pain: Secondary | ICD-10-CM | POA: Diagnosis not present

## 2020-03-09 ENCOUNTER — Ambulatory Visit: Payer: BC Managed Care – PPO | Admitting: Family Medicine

## 2020-03-28 DIAGNOSIS — F331 Major depressive disorder, recurrent, moderate: Secondary | ICD-10-CM | POA: Diagnosis not present

## 2020-07-26 DIAGNOSIS — M5431 Sciatica, right side: Secondary | ICD-10-CM | POA: Diagnosis not present

## 2020-07-26 DIAGNOSIS — R1011 Right upper quadrant pain: Secondary | ICD-10-CM | POA: Diagnosis not present

## 2020-08-04 ENCOUNTER — Other Ambulatory Visit: Payer: Self-pay | Admitting: Family Medicine

## 2020-08-04 DIAGNOSIS — M5431 Sciatica, right side: Secondary | ICD-10-CM

## 2020-08-19 ENCOUNTER — Other Ambulatory Visit: Payer: Self-pay

## 2020-08-19 ENCOUNTER — Ambulatory Visit
Admission: RE | Admit: 2020-08-19 | Discharge: 2020-08-19 | Disposition: A | Discharge status: Home / Self Care | Payer: BC Managed Care – PPO | Source: Ambulatory Visit | Attending: Family Medicine | Admitting: Family Medicine

## 2020-08-19 DIAGNOSIS — M5431 Sciatica, right side: Secondary | ICD-10-CM

## 2020-08-19 DIAGNOSIS — M545 Low back pain, unspecified: Secondary | ICD-10-CM | POA: Diagnosis not present

## 2020-08-19 IMAGING — MR MR LUMBAR SPINE W/O CM
4 of 5 series · 27 of 48 positions shown · non-contrast
Comparison: None.

CLINICAL DATA: Right side low back pain for 1 year. No known
injury.

EXAM:
MRI LUMBAR SPINE WITHOUT CONTRAST
TECHNIQUE: Multiplanar, multisequence MR imaging of the lumbar spine was
performed. No intravenous contrast was administered.

[Series 3: T2 · sagittal · 4.0mm · 1.09mm/px · 6 of 17 slices shown (1 of 2)]
[im 1/17]
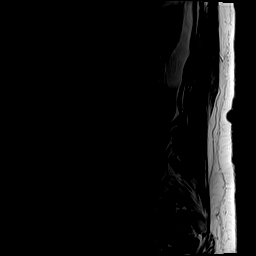
[im 4/17]
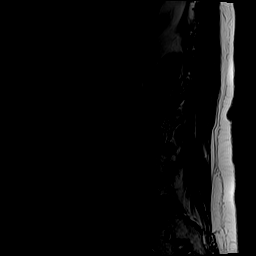
[im 7/17]
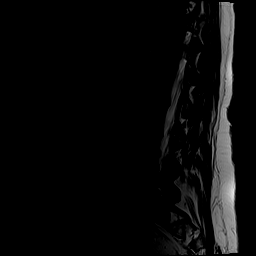
[im 10/17]
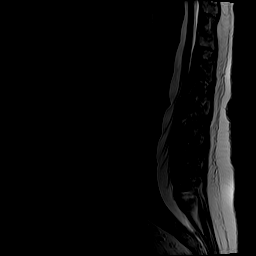
[im 13/17]
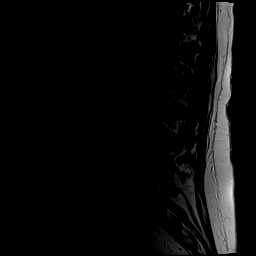
[im 17/17]
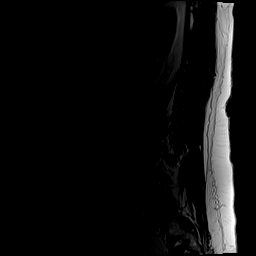

[Series 5: T1 · sagittal · 4.0mm · 1.09mm/px · 6 of 17 slices shown (1 of 2)]
[im 1/17]
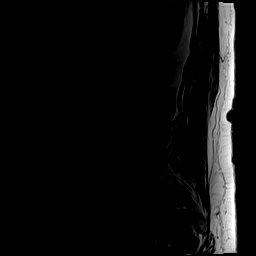
[im 4/17]
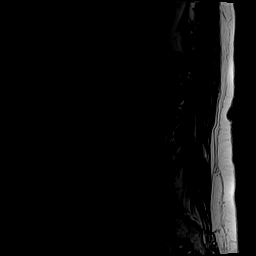
[im 7/17]
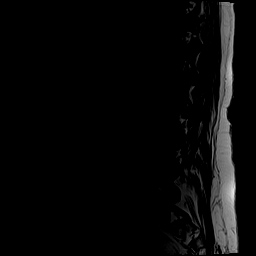
[im 10/17]
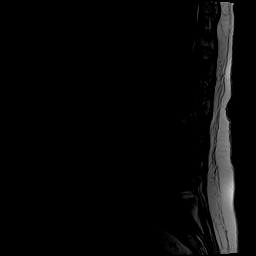
[im 13/17]
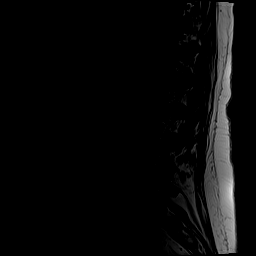
[im 17/17]
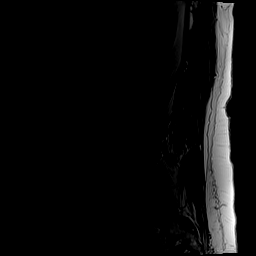

[Series 6: T2 · axial · 4.0mm · 0.39mm/px · z∈[-99,+120]mm · 9 of 42 slices shown (2 of 2)]
[im 1/42]
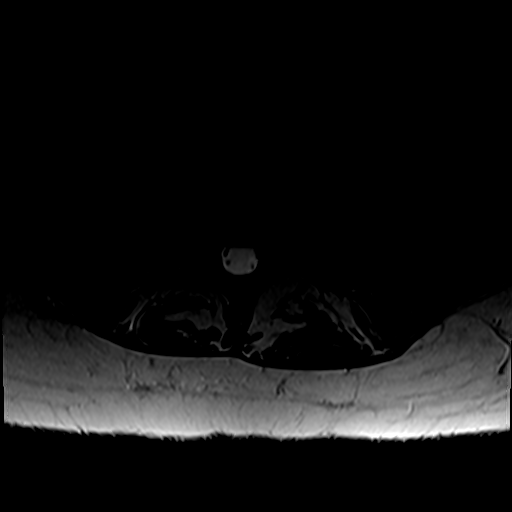
[im 6/42]
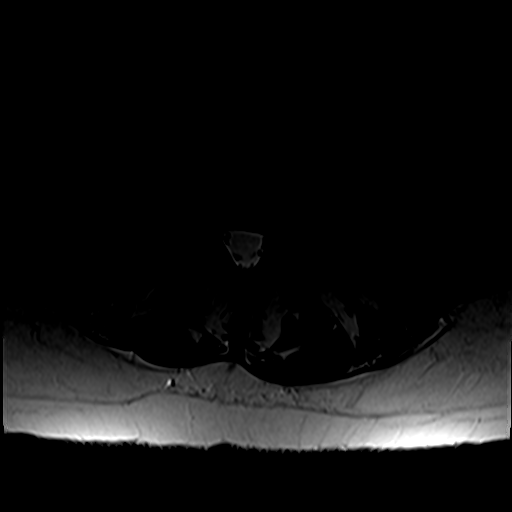
[im 12/42]
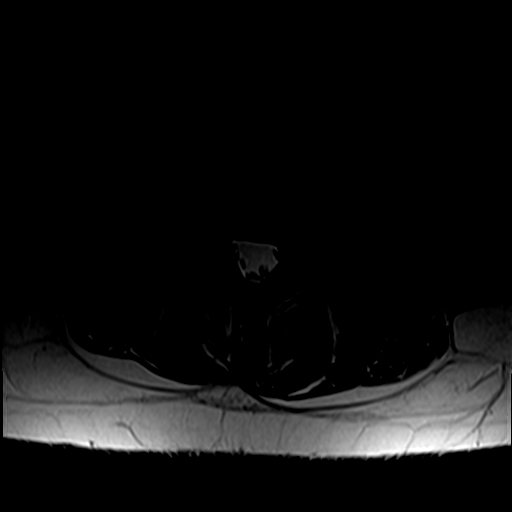
[im 18/42]
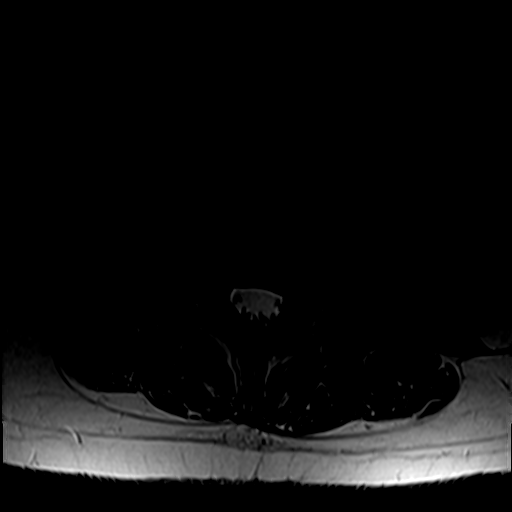
[im 21/42]
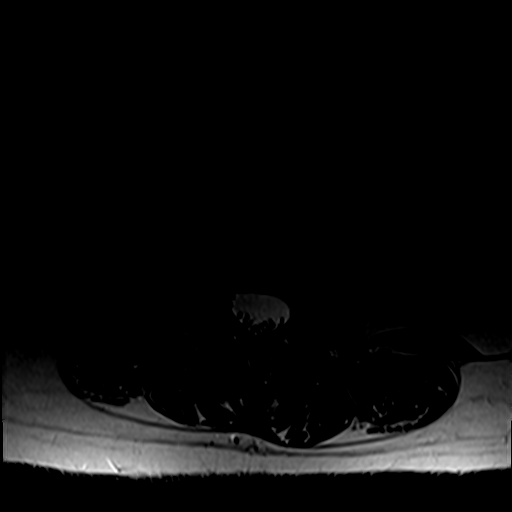
[im 24/42]
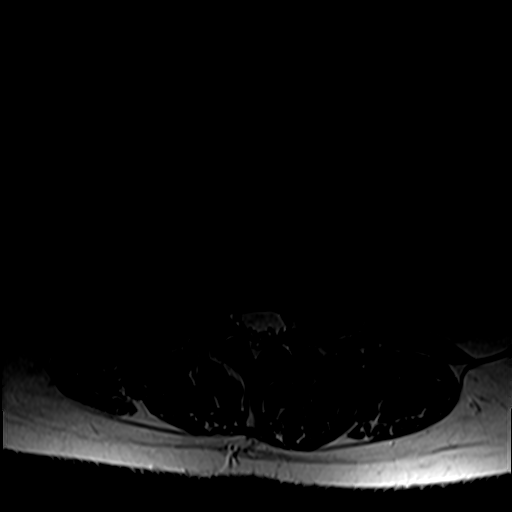
[im 30/42]
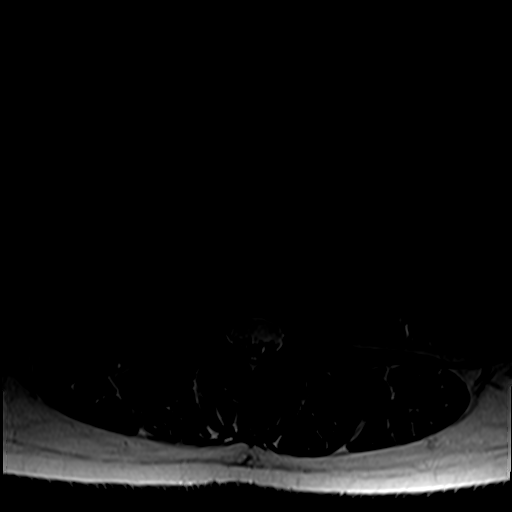
[im 36/42]
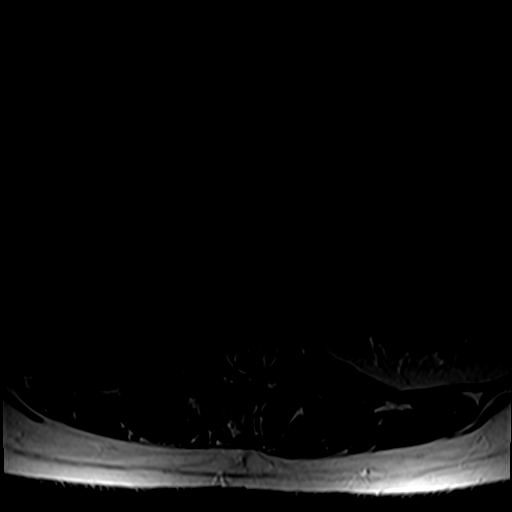
[im 42/42]
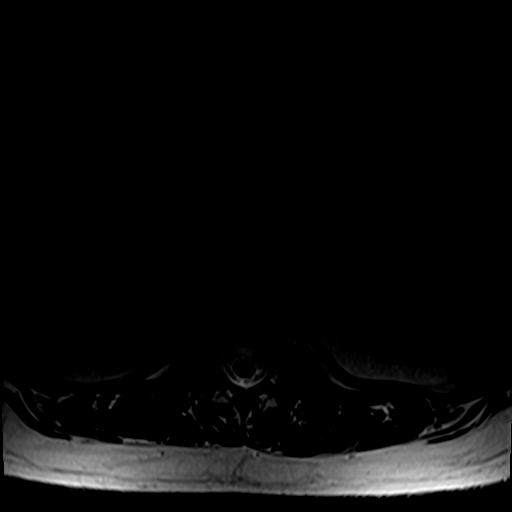

[Series 7: T1 · axial · 4.0mm · 0.39mm/px · z∈[-99,+91]mm · 6 of 42 slices shown (2 of 2)]
[im 1/42]
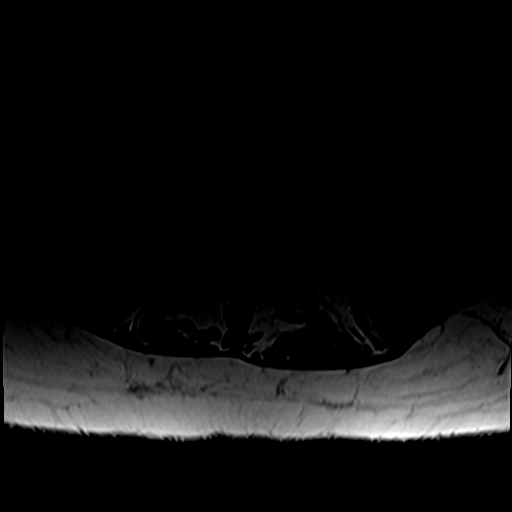
[im 6/42]
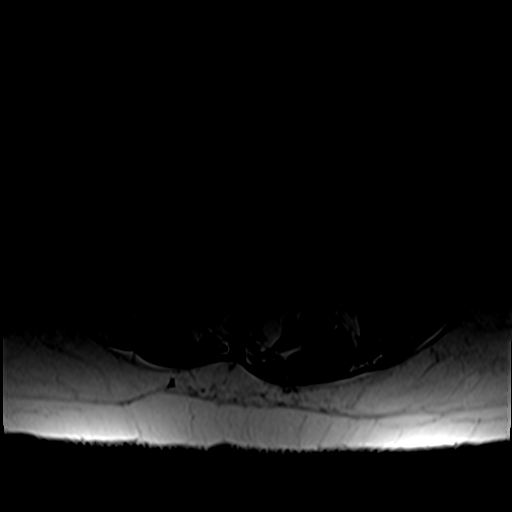
[im 12/42]
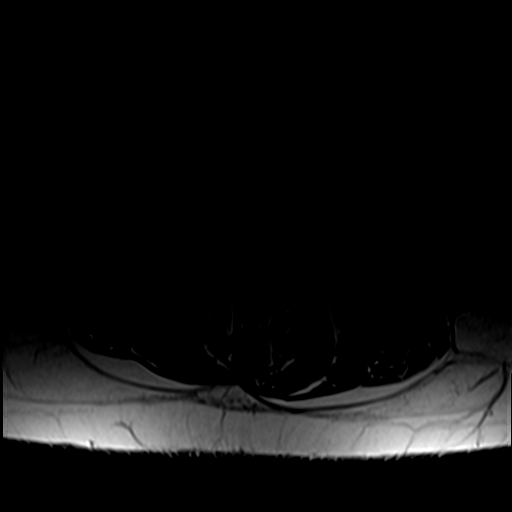
[im 18/42]
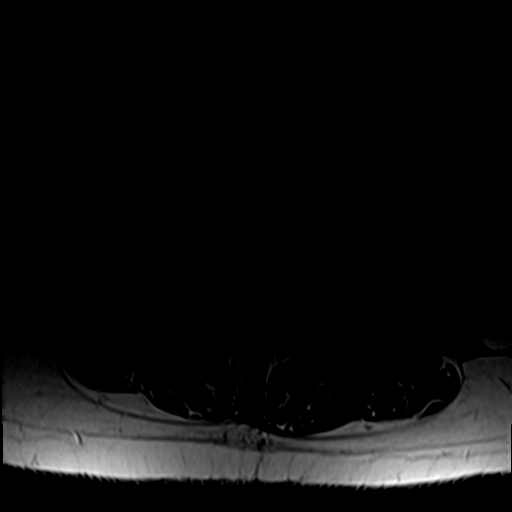
[im 21/42]
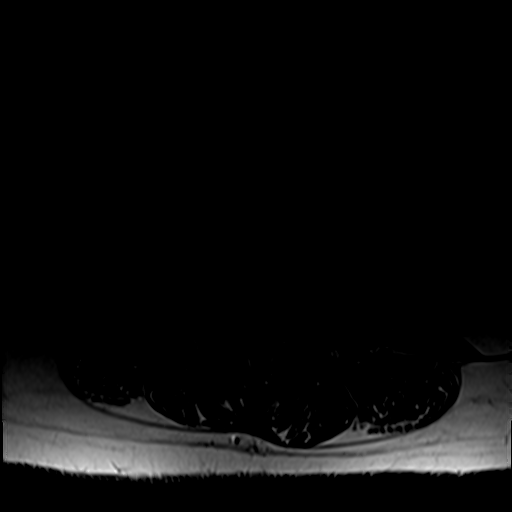
[im 36/42]
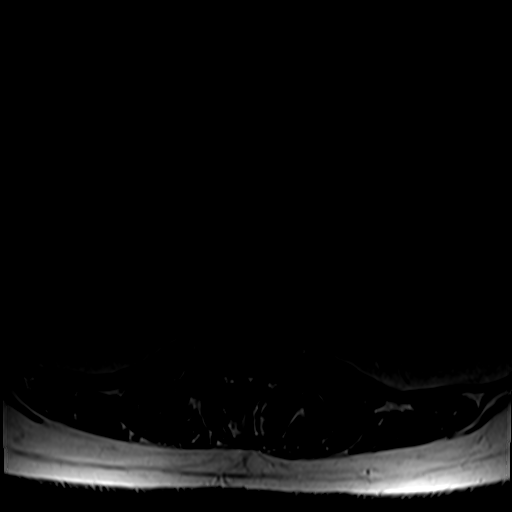

[27 of 48 positions shown; findings below may reference images not displayed]

FINDINGS: Segmentation:  Standard.

Alignment:  Mild convex left scoliosis.  No listhesis.

Vertebrae: No fracture, evidence of discitis, or bone lesion. There
is some degenerative endplate signal change at L2-3 and L5-S1.

Conus medullaris and cauda equina: Conus extends to the L2 level.
Conus and cauda equina appear normal.

Paraspinal and other soft tissues: Negative.

Disc levels:

T11-12 is imaged in the sagittal plane only. There is a very shallow
disc bulge at this level. No stenosis.

T12-L1: Negative.

L1-2: Negative.

L2-3: Shallow central and eccentric to the right protrusion without
stenosis.

L3-4: Minimal disc bulge without stenosis.

L4-5: Mild facet degenerative change and a very shallow disc bulge
without stenosis.

L5-S1: Minimal disc bulge and endplate spur without stenosis.
IMPRESSION: Mild lumbar degenerative change without central canal or foraminal
narrowing.

## 2020-09-02 DIAGNOSIS — M545 Low back pain, unspecified: Secondary | ICD-10-CM | POA: Diagnosis not present

## 2020-09-26 ENCOUNTER — Other Ambulatory Visit: Payer: Self-pay | Admitting: Physician Assistant

## 2020-09-26 DIAGNOSIS — K5792 Diverticulitis of intestine, part unspecified, without perforation or abscess without bleeding: Secondary | ICD-10-CM | POA: Diagnosis not present

## 2020-09-29 ENCOUNTER — Other Ambulatory Visit: Payer: Self-pay

## 2020-09-29 ENCOUNTER — Ambulatory Visit (HOSPITAL_BASED_OUTPATIENT_CLINIC_OR_DEPARTMENT_OTHER)
Admission: RE | Admit: 2020-09-29 | Discharge: 2020-09-29 | Disposition: A | Discharge status: Home / Self Care | Payer: BC Managed Care – PPO | Source: Ambulatory Visit | Attending: Physician Assistant | Admitting: Physician Assistant

## 2020-09-29 DIAGNOSIS — R109 Unspecified abdominal pain: Secondary | ICD-10-CM | POA: Diagnosis not present

## 2020-09-29 DIAGNOSIS — K5792 Diverticulitis of intestine, part unspecified, without perforation or abscess without bleeding: Secondary | ICD-10-CM | POA: Insufficient documentation

## 2020-09-29 LAB — POCT I-STAT CREATININE: Creatinine, Ser: 1.1 mg/dL — ABNORMAL HIGH (ref 0.44–1.00)

## 2020-09-29 IMAGING — CT CT ABD-PELV W/ CM
2 of 5 series · 16 of 46 positions shown, 18 images · IV contrast (omnipaque)
Comparison: [DATE] and previous

CLINICAL DATA: Lower abdominal pain x1 week, history of
diverticulitis, appendectomy, hysterectomy

EXAM:
CT ABDOMEN AND PELVIS WITH CONTRAST
TECHNIQUE: Multidetector CT imaging of the abdomen and pelvis was performed
using the standard protocol following bolus administration of
intravenous contrast.
CONTRAST:  75mL OMNIPAQUE IOHEXOL 350 MG/ML SOLN

[Series 2: abd pel w · axial · 0.81mm/px · z∈[+796,+1231]mm · 13 of 97 slices shown, 15 images]
[im 5/97  soft-tissue]
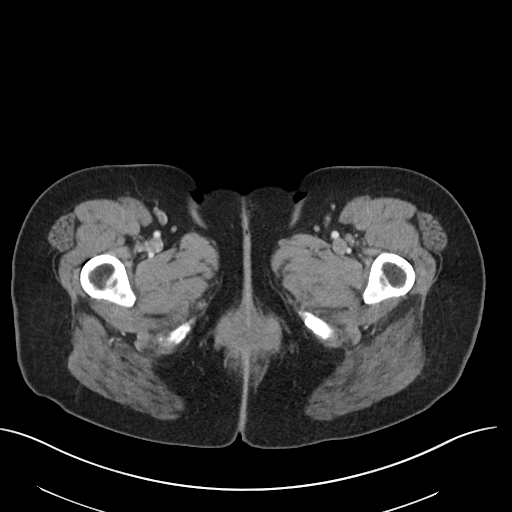
[im 5/97  bone]
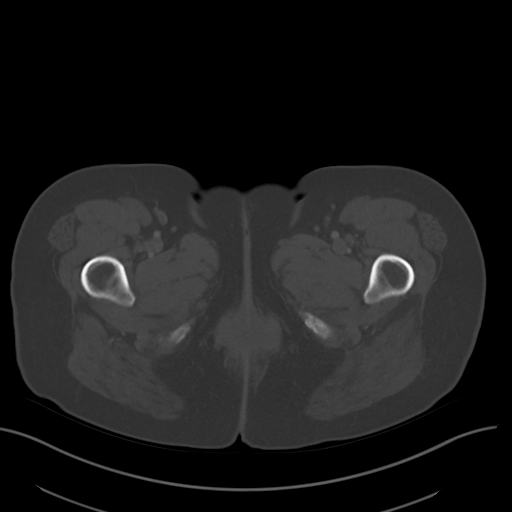
[im 15/97  soft-tissue]
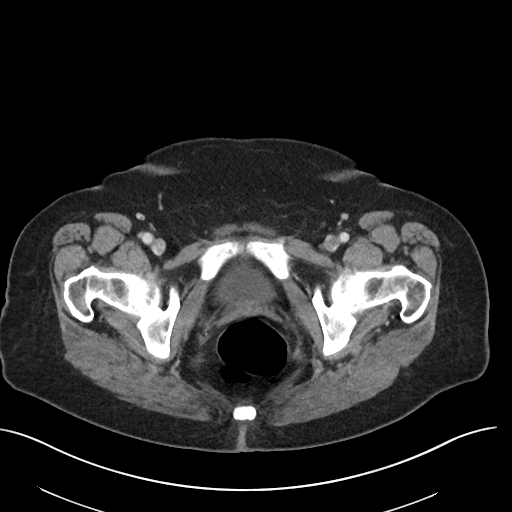
[im 20/97  soft-tissue]
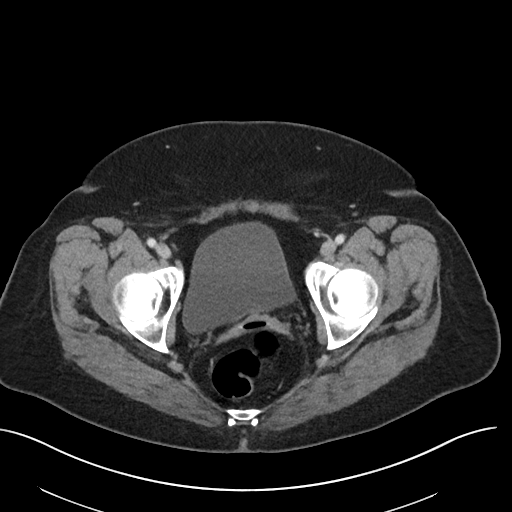
[im 29/97  soft-tissue]
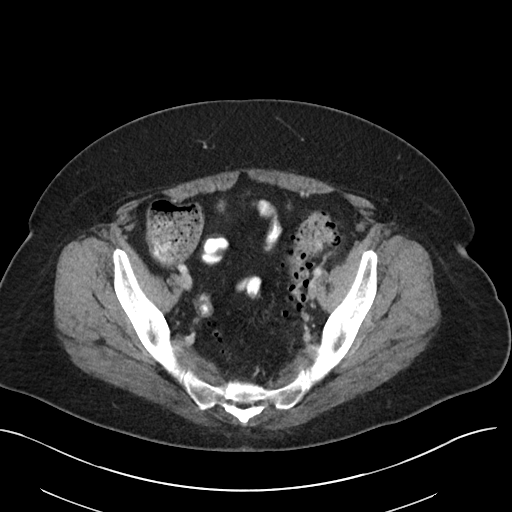
[im 34/97  soft-tissue]
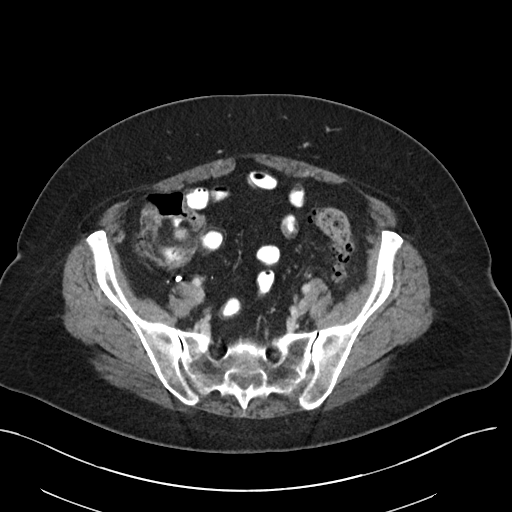
[im 44/97  soft-tissue]
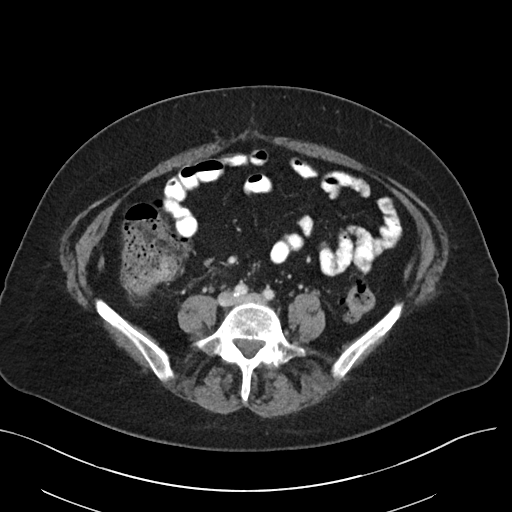
[im 49/97  soft-tissue]
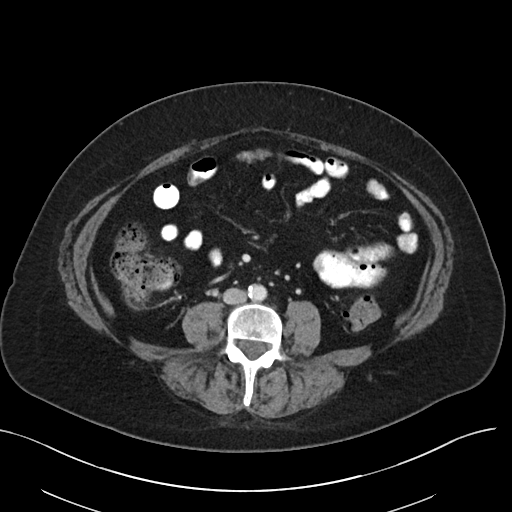
[im 53/97  soft-tissue]
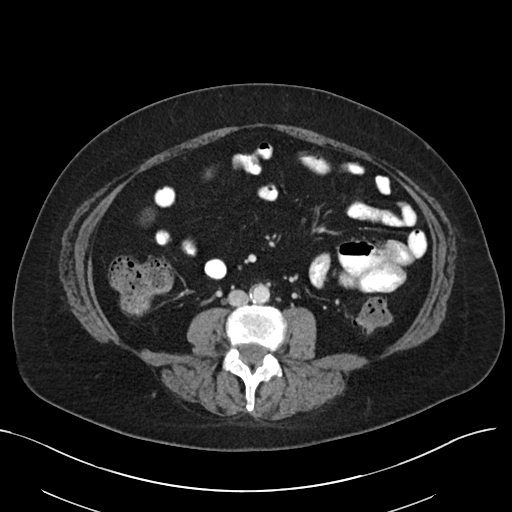
[im 63/97  soft-tissue]
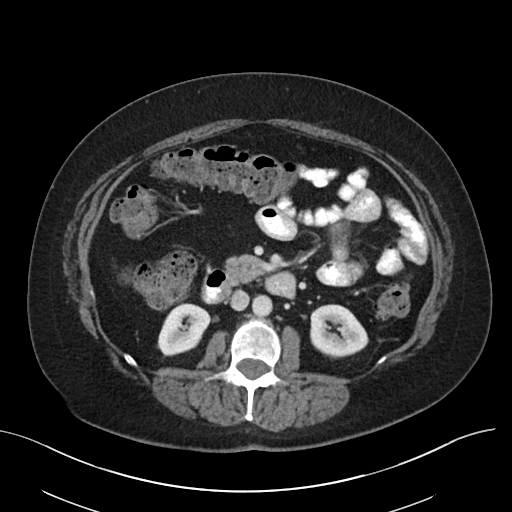
[im 63/97  bone]
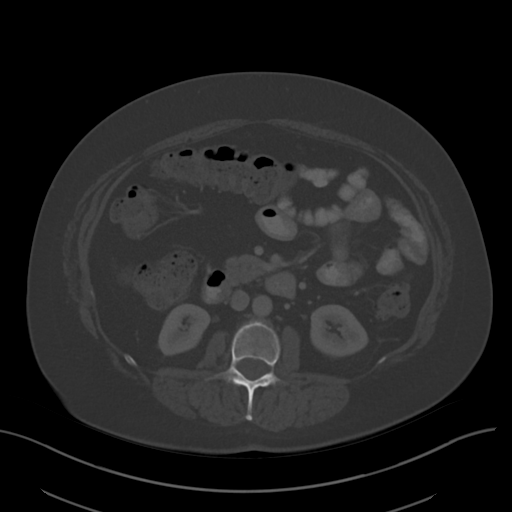
[im 68/97  soft-tissue]
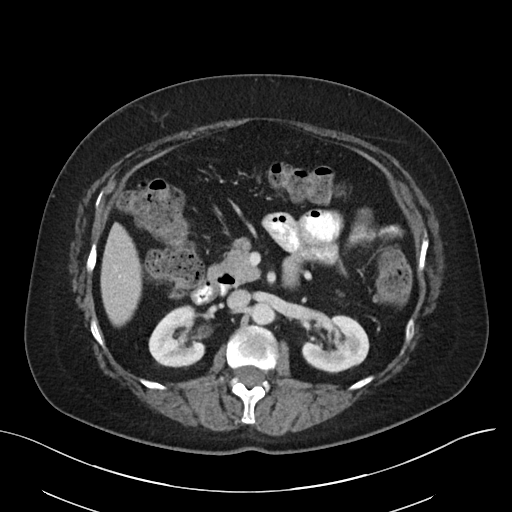
[im 77/97  soft-tissue]
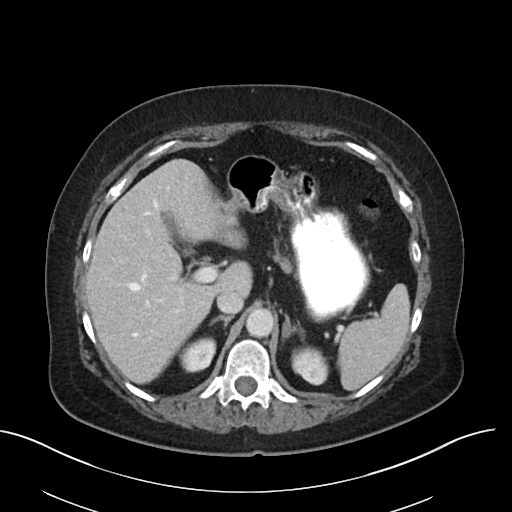
[im 82/97  soft-tissue]
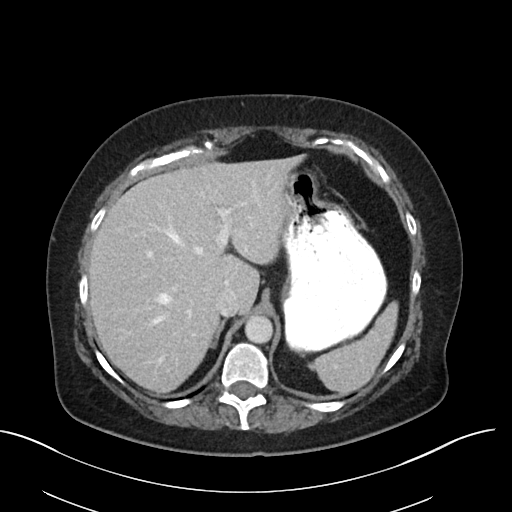
[im 92/97  soft-tissue]
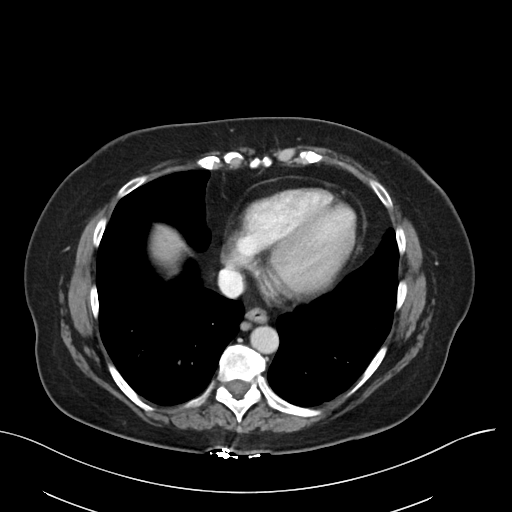

[Series 5: coronal · coronal · 0.86mm/px · 3 of 108 slices shown]
[im 36/108  soft-tissue]
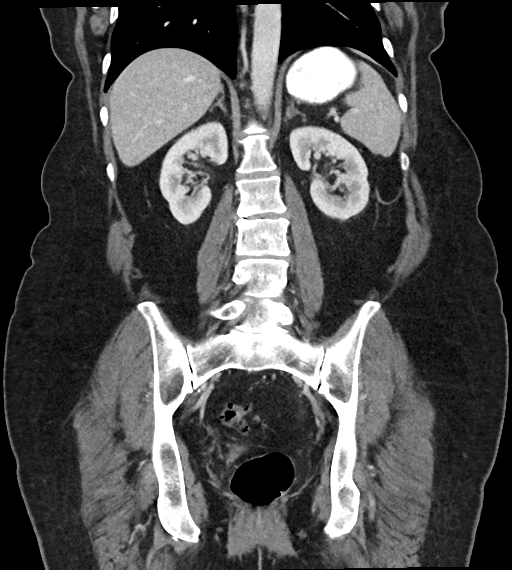
[im 48/108  soft-tissue]
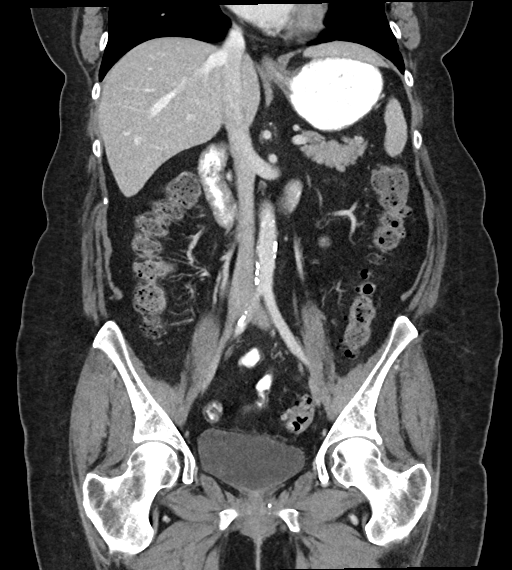
[im 60/108  soft-tissue]
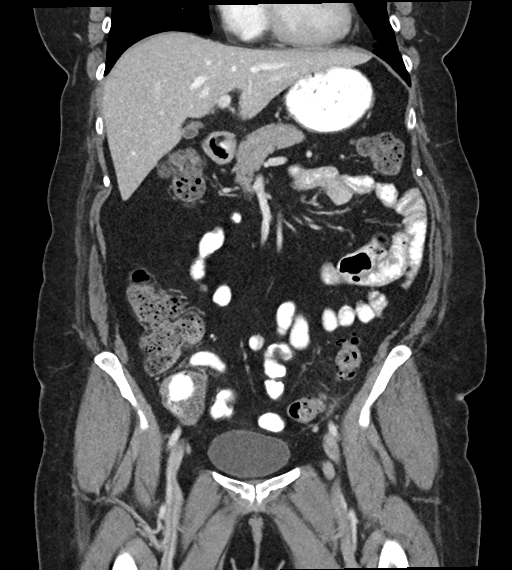

[16 of 46 positions shown; findings below may reference images not displayed]

FINDINGS: Lower chest: No pleural or pericardial effusion. Visualized lung
bases clear.

Hepatobiliary: No focal liver abnormality is seen. No gallstones,
gallbladder wall thickening, or biliary dilatation.

Pancreas: Unremarkable. No pancreatic ductal dilatation or
surrounding inflammatory changes.

Spleen: Normal in size without focal abnormality.

Adrenals/Urinary Tract: 1 cm right adrenal nodule stable since
[DATE] presumably benign adrenal adenoma. Symmetric renal
enhancement without focal lesion, urolithiasis, or hydronephrosis.
Urinary bladder incompletely distended, unremarkable.

Stomach/Bowel: Physiologically distended by ingested material. Small
bowel is nondistended, with good distal passage of oral contrast
material. Post appendectomy. Scattered descending and innumerable
sigmoid diverticula. Subtle progressive inflammatory/edematous
changes around the descending/sigmoid junction compared to most
recent previous study suggesting mild diverticulitis. No abscess.

Vascular/Lymphatic: Mild aortoiliac calcified plaque without
aneurysm. Portal vein patent. No abdominal or pelvic adenopathy.

Reproductive: Status post hysterectomy. No adnexal masses.

Other: Bilateral pelvic phleboliths.  No ascites.  No free air.

Musculoskeletal: Mild spondylitic changes in the lower thoracic and
lumbar spine. No fracture or worrisome bone lesion.
IMPRESSION: 1. Changes of mild diverticulitis at the descending/sigmoid
junction, without abscess.
2.  Aortic Atherosclerosis ([7I]-170.0).

## 2020-09-29 MED ORDER — IOHEXOL 350 MG/ML SOLN
75.0000 mL | Freq: Once | INTRAVENOUS | Status: AC | PRN
  Administered 2020-09-29: 75 mL via INTRAVENOUS

## 2020-10-25 ENCOUNTER — Other Ambulatory Visit: Payer: Self-pay | Admitting: Gastroenterology

## 2020-10-25 DIAGNOSIS — R1084 Generalized abdominal pain: Secondary | ICD-10-CM

## 2020-10-25 DIAGNOSIS — K5732 Diverticulitis of large intestine without perforation or abscess without bleeding: Secondary | ICD-10-CM | POA: Diagnosis not present

## 2020-10-25 DIAGNOSIS — K625 Hemorrhage of anus and rectum: Secondary | ICD-10-CM | POA: Diagnosis not present

## 2020-11-09 ENCOUNTER — Ambulatory Visit
Admission: RE | Admit: 2020-11-09 | Discharge: 2020-11-09 | Disposition: A | Discharge status: Home / Self Care | Payer: BC Managed Care – PPO | Source: Ambulatory Visit | Attending: Gastroenterology | Admitting: Gastroenterology

## 2020-11-09 ENCOUNTER — Inpatient Hospital Stay: Admission: RE | Admit: 2020-11-09 | Payer: BC Managed Care – PPO | Source: Ambulatory Visit

## 2020-11-09 DIAGNOSIS — C4490 Unspecified malignant neoplasm of skin, unspecified: Secondary | ICD-10-CM | POA: Diagnosis not present

## 2020-11-09 DIAGNOSIS — R110 Nausea: Secondary | ICD-10-CM | POA: Diagnosis not present

## 2020-11-09 DIAGNOSIS — R1084 Generalized abdominal pain: Secondary | ICD-10-CM

## 2020-11-09 DIAGNOSIS — K5730 Diverticulosis of large intestine without perforation or abscess without bleeding: Secondary | ICD-10-CM | POA: Diagnosis not present

## 2020-11-09 DIAGNOSIS — K5732 Diverticulitis of large intestine without perforation or abscess without bleeding: Secondary | ICD-10-CM

## 2020-11-09 DIAGNOSIS — M47817 Spondylosis without myelopathy or radiculopathy, lumbosacral region: Secondary | ICD-10-CM | POA: Diagnosis not present

## 2020-11-09 IMAGING — CT CT ABD-PELV W/ CM
2 of 5 series · 13 of 46 positions shown, 15 images · IV contrast (iopamidol)
Comparison: [DATE]

CLINICAL DATA: Nausea.  Skin cancer.  Hysterectomy.  Appendectomy.

EXAM:
CT ABDOMEN AND PELVIS WITH CONTRAST
TECHNIQUE: Multidetector CT imaging of the abdomen and pelvis was performed
using the standard protocol following bolus administration of
intravenous contrast.
CONTRAST:  100mL [BL] IOPAMIDOL ([BL]) INJECTION 61%

[Series 2: abd pelvis 5.00 br40 s3 axial · axial · 0.63mm/px · z∈[+1264,+1684]mm · 10 of 96 slices shown, 12 images]
[im 6/96  soft-tissue]
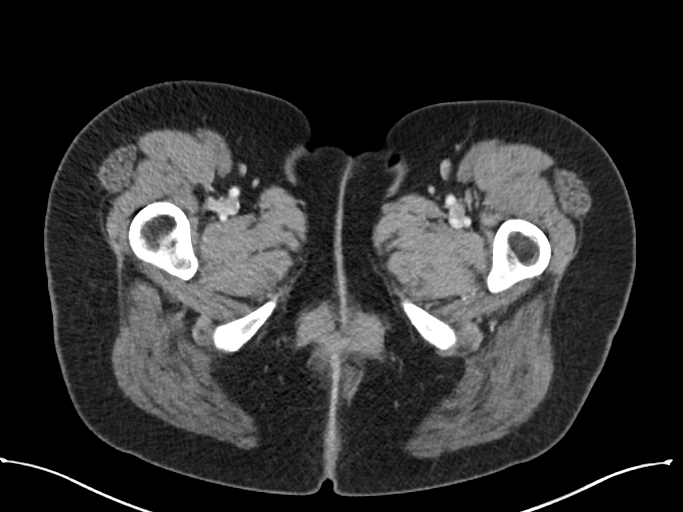
[im 6/96  bone]
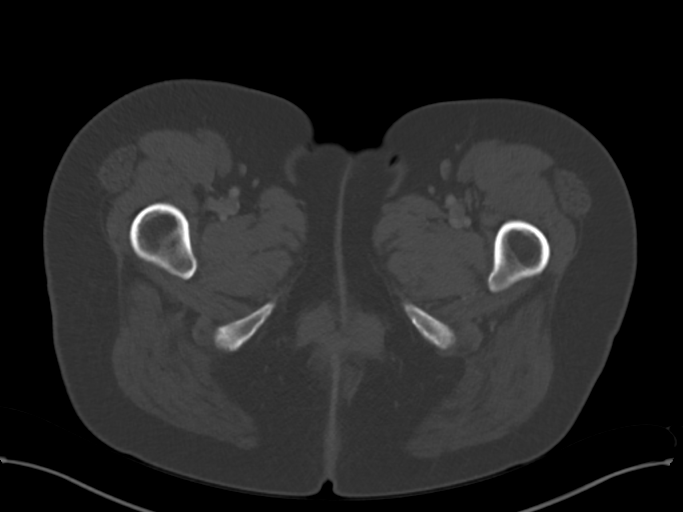
[im 16/96  soft-tissue]
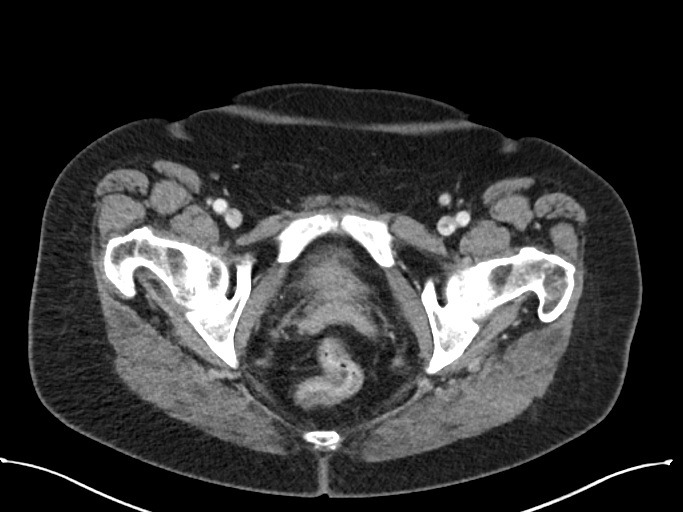
[im 27/96  soft-tissue]
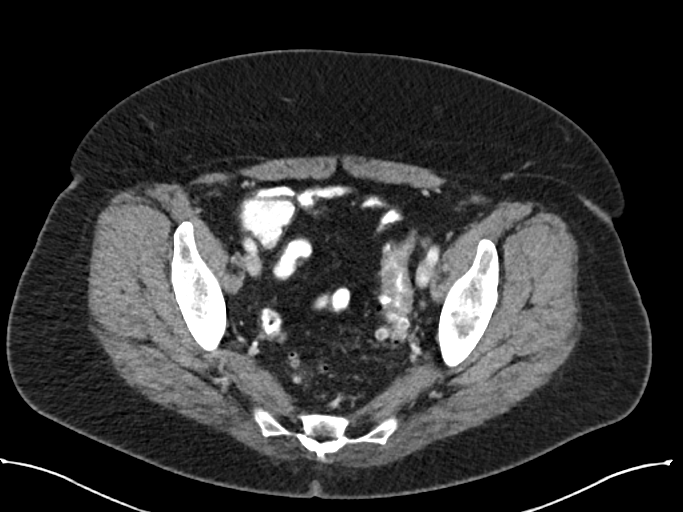
[im 32/96  soft-tissue]
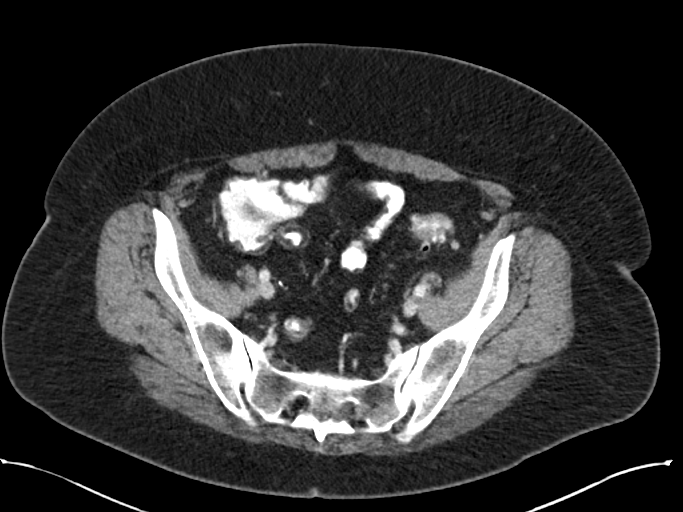
[im 43/96  soft-tissue]
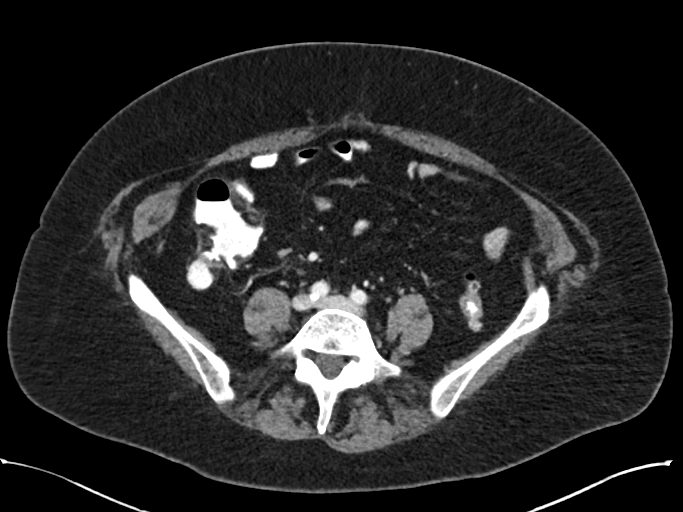
[im 53/96  soft-tissue]
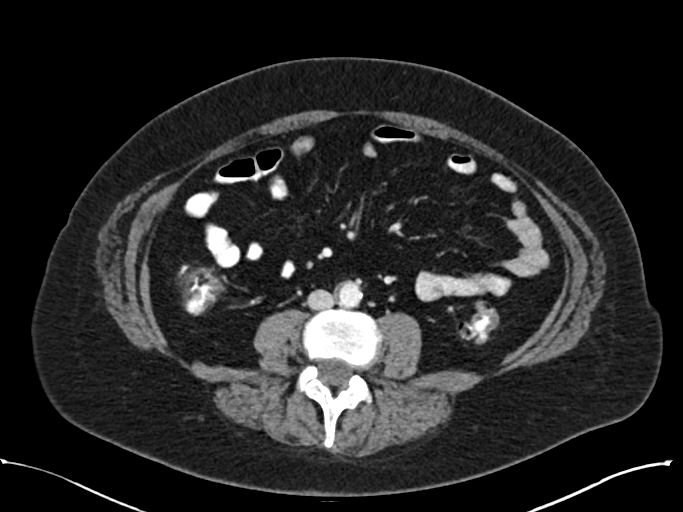
[im 64/96  soft-tissue]
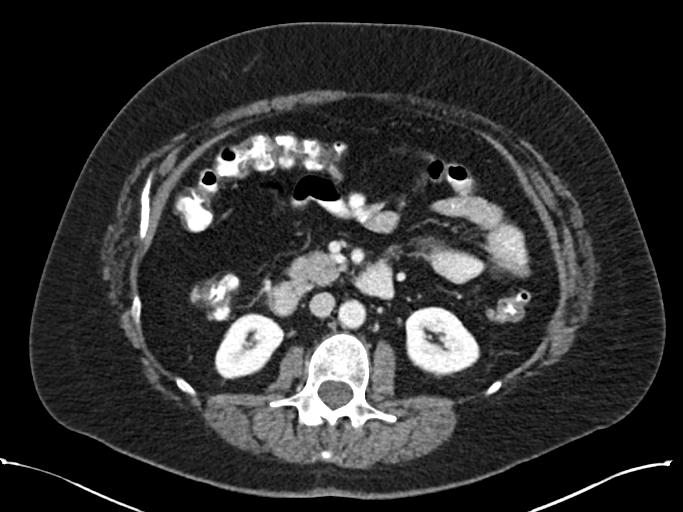
[im 69/96  soft-tissue]
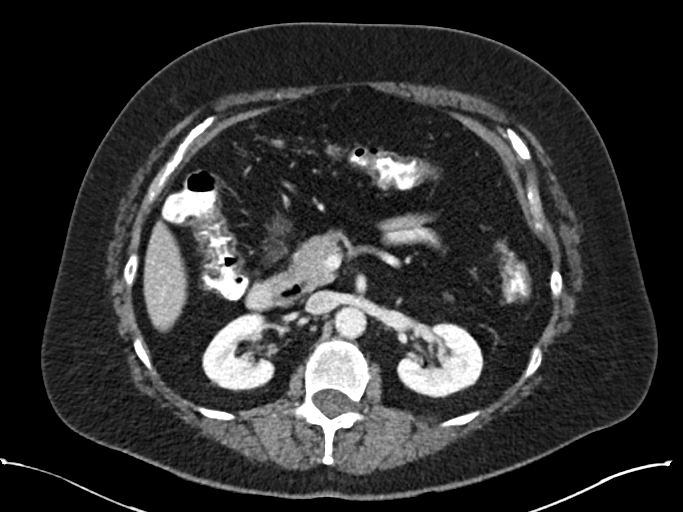
[im 80/96  soft-tissue]
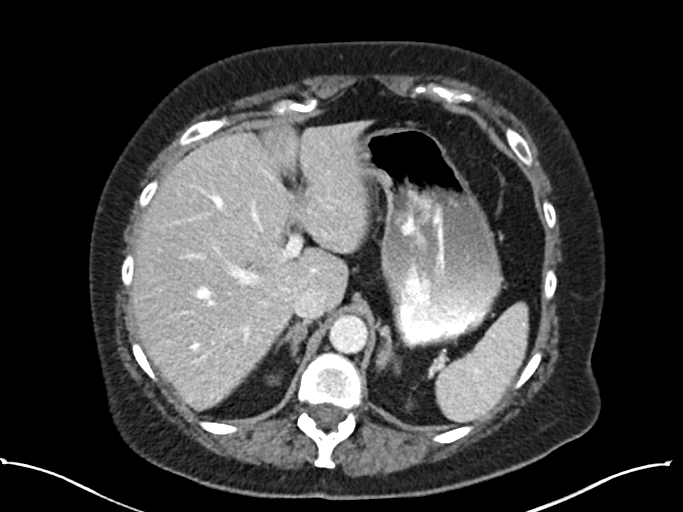
[im 80/96  bone]
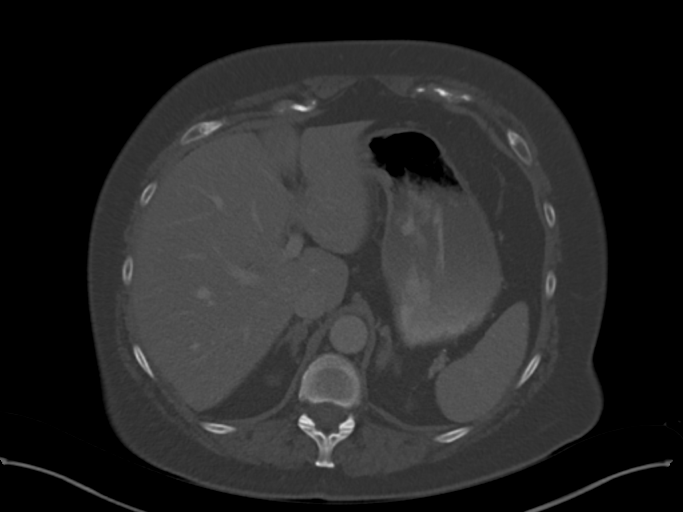
[im 90/96  soft-tissue]
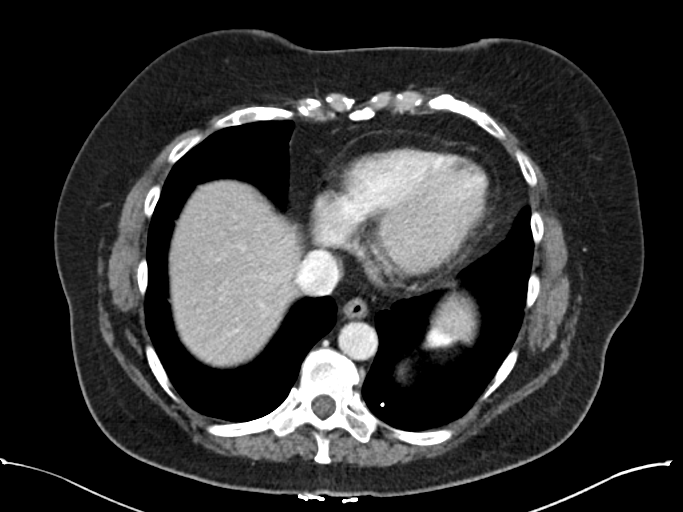

[Series 6: abd pelvis 2.00 br40 s3 cor · coronal · 0.83mm/px · 3 of 153 slices shown]
[im 51/153  soft-tissue]
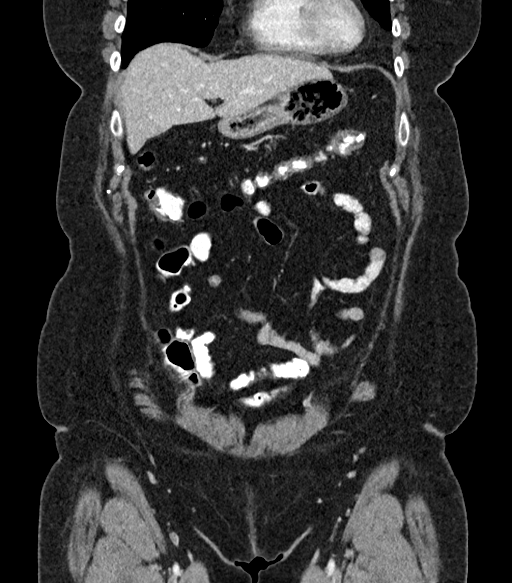
[im 68/153  soft-tissue]
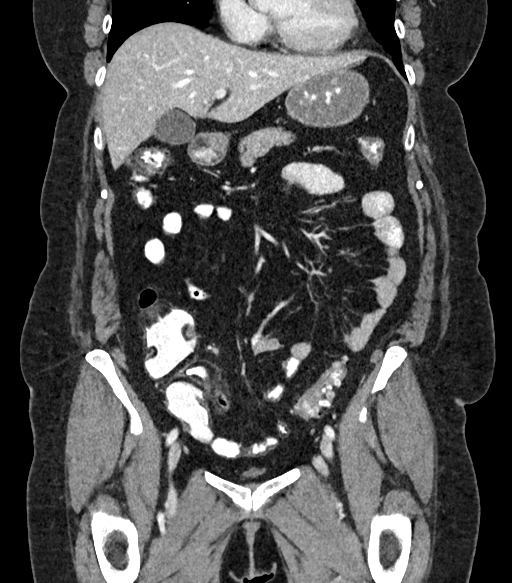
[im 85/153  soft-tissue]
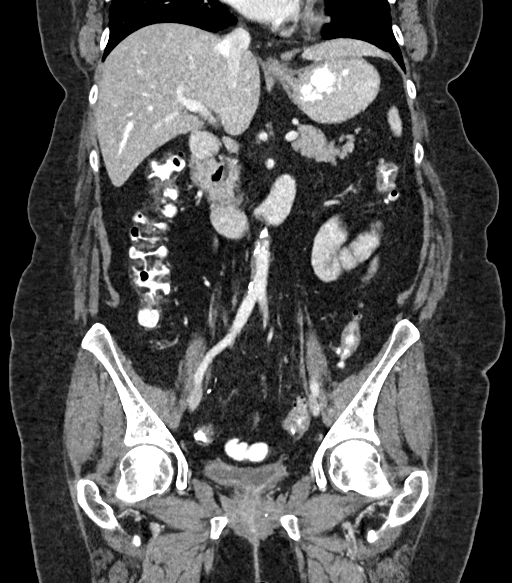

[13 of 46 positions shown; findings below may reference images not displayed]

FINDINGS: Lower chest: Calcified granuloma in the left lower lobe. Normal
heart size without pericardial or pleural effusion.

Hepatobiliary: Normal liver. Normal gallbladder, without biliary
ductal dilatation.

Pancreas: Normal, without mass or ductal dilatation.

Spleen: Normal in size, without focal abnormality.

Adrenals/Urinary Tract: Left adrenal thickening with maintenance of
adreniform shape. 11 mm right adrenal nodule is similar to on the
prior exam and demonstrates postcontrast characteristics consistent
with an adenoma. Similar to [DATE].

Normal kidneys, without hydronephrosis. The bladder decompressed,
but the wall appears thickened and irregular including on 80/2.

Stomach/Bowel: Normal stomach, without wall thickening. Extensive
colonic diverticulosis. Nonspecific submucosal fat prominence within
the terminal and distal ileum which has been described in
inflammatory bowel disease and as a normal variant. Otherwise normal
small bowel.

Vascular/Lymphatic: Advanced aortic and branch vessel
atherosclerosis. No abdominopelvic adenopathy.

Reproductive: Hysterectomy.  No adnexal mass.

Other: No significant free fluid.  No free intraperitoneal air.

Musculoskeletal: Lumbosacral spondylosis.
IMPRESSION: 1.  No acute process or explanation for nausea.
2. Apparent bladder wall thickening and irregularity in the setting
of underdistention. Recommend clinical correlation for cystitis.
3. Right adrenal adenoma.
4. Distal and terminal ileal submucosal fat prominence has been
described in patients with inflammatory bowel disease and as a
normal variant. No evidence of acute inflammation.

## 2020-11-09 MED ORDER — IOPAMIDOL (ISOVUE-300) INJECTION 61%
100.0000 mL | Freq: Once | INTRAVENOUS | Status: AC | PRN
  Administered 2020-11-09: 100 mL via INTRAVENOUS

## 2020-11-14 DIAGNOSIS — R8290 Unspecified abnormal findings in urine: Secondary | ICD-10-CM | POA: Diagnosis not present

## 2020-11-14 DIAGNOSIS — G2581 Restless legs syndrome: Secondary | ICD-10-CM | POA: Diagnosis not present

## 2020-11-14 DIAGNOSIS — I10 Essential (primary) hypertension: Secondary | ICD-10-CM | POA: Diagnosis not present

## 2020-11-14 DIAGNOSIS — E039 Hypothyroidism, unspecified: Secondary | ICD-10-CM | POA: Diagnosis not present

## 2020-11-14 DIAGNOSIS — R7303 Prediabetes: Secondary | ICD-10-CM | POA: Diagnosis not present

## 2020-11-28 DIAGNOSIS — E785 Hyperlipidemia, unspecified: Secondary | ICD-10-CM | POA: Diagnosis not present

## 2020-11-30 DIAGNOSIS — Z23 Encounter for immunization: Secondary | ICD-10-CM | POA: Diagnosis not present

## 2020-12-07 DIAGNOSIS — F5109 Other insomnia not due to a substance or known physiological condition: Secondary | ICD-10-CM | POA: Diagnosis not present

## 2020-12-13 ENCOUNTER — Encounter (HOSPITAL_COMMUNITY): Payer: Self-pay | Admitting: Emergency Medicine

## 2020-12-13 ENCOUNTER — Emergency Department (HOSPITAL_COMMUNITY): Payer: BC Managed Care – PPO

## 2020-12-13 ENCOUNTER — Other Ambulatory Visit: Payer: Self-pay

## 2020-12-13 ENCOUNTER — Emergency Department (HOSPITAL_COMMUNITY)
Admission: EM | Admit: 2020-12-13 | Discharge: 2020-12-14 | Disposition: A | Discharge status: Home / Self Care | Payer: BC Managed Care – PPO | Attending: Emergency Medicine | Admitting: Emergency Medicine

## 2020-12-13 DIAGNOSIS — M5441 Lumbago with sciatica, right side: Secondary | ICD-10-CM | POA: Diagnosis not present

## 2020-12-13 DIAGNOSIS — Z9049 Acquired absence of other specified parts of digestive tract: Secondary | ICD-10-CM | POA: Diagnosis not present

## 2020-12-13 DIAGNOSIS — K5730 Diverticulosis of large intestine without perforation or abscess without bleeding: Secondary | ICD-10-CM | POA: Insufficient documentation

## 2020-12-13 DIAGNOSIS — R109 Unspecified abdominal pain: Secondary | ICD-10-CM | POA: Diagnosis not present

## 2020-12-13 DIAGNOSIS — N189 Chronic kidney disease, unspecified: Secondary | ICD-10-CM | POA: Diagnosis not present

## 2020-12-13 DIAGNOSIS — I129 Hypertensive chronic kidney disease with stage 1 through stage 4 chronic kidney disease, or unspecified chronic kidney disease: Secondary | ICD-10-CM | POA: Diagnosis not present

## 2020-12-13 DIAGNOSIS — N3001 Acute cystitis with hematuria: Secondary | ICD-10-CM | POA: Diagnosis not present

## 2020-12-13 DIAGNOSIS — K5732 Diverticulitis of large intestine without perforation or abscess without bleeding: Secondary | ICD-10-CM | POA: Diagnosis not present

## 2020-12-13 DIAGNOSIS — Z79899 Other long term (current) drug therapy: Secondary | ICD-10-CM | POA: Diagnosis not present

## 2020-12-13 DIAGNOSIS — G8929 Other chronic pain: Secondary | ICD-10-CM

## 2020-12-13 DIAGNOSIS — Z87891 Personal history of nicotine dependence: Secondary | ICD-10-CM | POA: Insufficient documentation

## 2020-12-13 DIAGNOSIS — E039 Hypothyroidism, unspecified: Secondary | ICD-10-CM | POA: Insufficient documentation

## 2020-12-13 DIAGNOSIS — I700 Atherosclerosis of aorta: Secondary | ICD-10-CM | POA: Diagnosis not present

## 2020-12-13 LAB — CBC WITH DIFFERENTIAL/PLATELET
Abs Immature Granulocytes: 0.03 10*3/uL (ref 0.00–0.07)
Basophils Absolute: 0.1 10*3/uL (ref 0.0–0.1)
Basophils Relative: 1 %
Eosinophils Absolute: 0.1 10*3/uL (ref 0.0–0.5)
Eosinophils Relative: 2 %
HCT: 43 % (ref 36.0–46.0)
Hemoglobin: 13.9 g/dL (ref 12.0–15.0)
Immature Granulocytes: 0 %
Lymphocytes Relative: 28 %
Lymphs Abs: 2.6 10*3/uL (ref 0.7–4.0)
MCH: 27.3 pg (ref 26.0–34.0)
MCHC: 32.3 g/dL (ref 30.0–36.0)
MCV: 84.3 fL (ref 80.0–100.0)
Monocytes Absolute: 0.5 10*3/uL (ref 0.1–1.0)
Monocytes Relative: 6 %
Neutro Abs: 5.9 10*3/uL (ref 1.7–7.7)
Neutrophils Relative %: 63 %
Platelets: 301 10*3/uL (ref 150–400)
RBC: 5.1 MIL/uL (ref 3.87–5.11)
RDW: 13.4 % (ref 11.5–15.5)
WBC: 9.2 10*3/uL (ref 4.0–10.5)
nRBC: 0 % (ref 0.0–0.2)

## 2020-12-13 LAB — BASIC METABOLIC PANEL
Anion gap: 10 (ref 5–15)
BUN: 18 mg/dL (ref 8–23)
CO2: 28 mmol/L (ref 22–32)
Calcium: 9.7 mg/dL (ref 8.9–10.3)
Chloride: 98 mmol/L (ref 98–111)
Creatinine, Ser: 1.26 mg/dL — ABNORMAL HIGH (ref 0.44–1.00)
GFR, Estimated: 48 mL/min — ABNORMAL LOW (ref >60)
Glucose, Bld: 114 mg/dL — ABNORMAL HIGH (ref 70–99)
Potassium: 4.2 mmol/L (ref 3.5–5.1)
Sodium: 136 mmol/L (ref 135–145)

## 2020-12-13 LAB — URINALYSIS, ROUTINE W REFLEX MICROSCOPIC
Bilirubin Urine: NEGATIVE
Glucose, UA: NEGATIVE mg/dL
Hgb urine dipstick: NEGATIVE
Ketones, ur: NEGATIVE mg/dL
Nitrite: POSITIVE — AB
Protein, ur: NEGATIVE mg/dL
Specific Gravity, Urine: 1.021 (ref 1.005–1.030)
pH: 5 (ref 5.0–8.0)

## 2020-12-13 IMAGING — CT CT RENAL STONE PROTOCOL
2 of 4 series · 16 of 46 positions shown, 18 images · non-contrast
Comparison: None.

CLINICAL DATA: Flank pain, kidney stone suspected right flank pain

EXAM:
CT ABDOMEN AND PELVIS WITHOUT CONTRAST
TECHNIQUE: Multidetector CT imaging of the abdomen and pelvis was performed
following the standard protocol without IV contrast.

[Series 3: ap without · axial · non-contrast · 0.71mm/px · z∈[+854,+1269]mm · 13 of 93 slices shown, 15 images]
[im 5/93  soft-tissue]
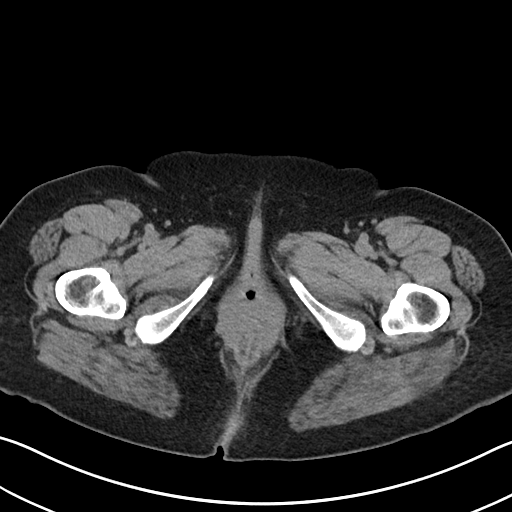
[im 5/93  bone]
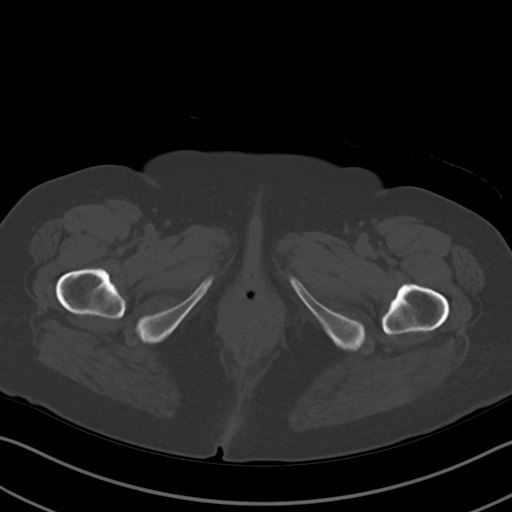
[im 14/93  soft-tissue]
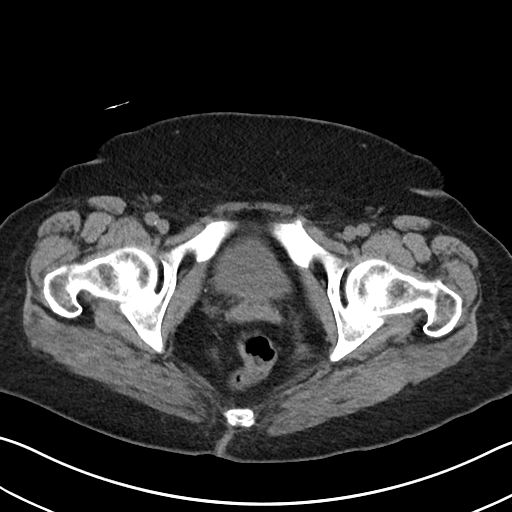
[im 18/93  soft-tissue]
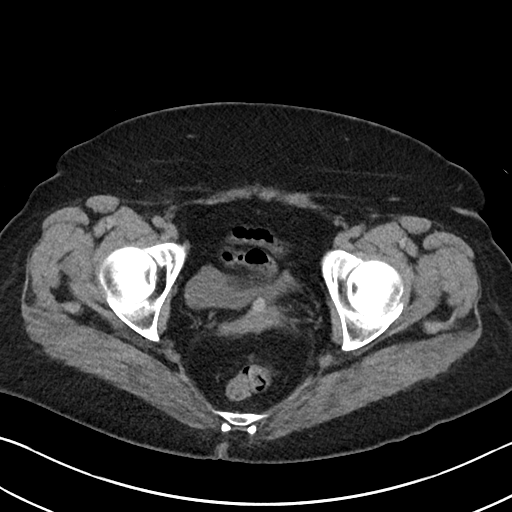
[im 27/93  soft-tissue]
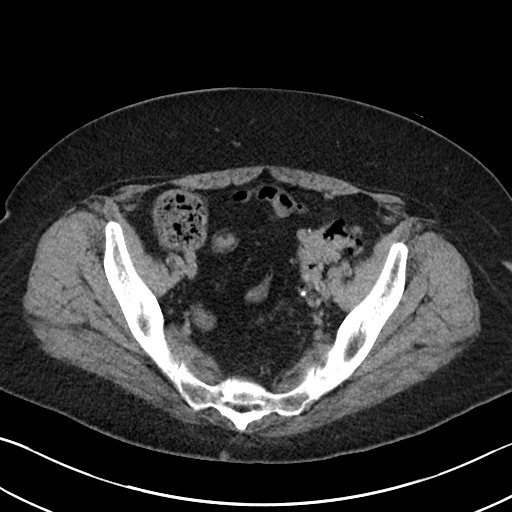
[im 31/93  soft-tissue]
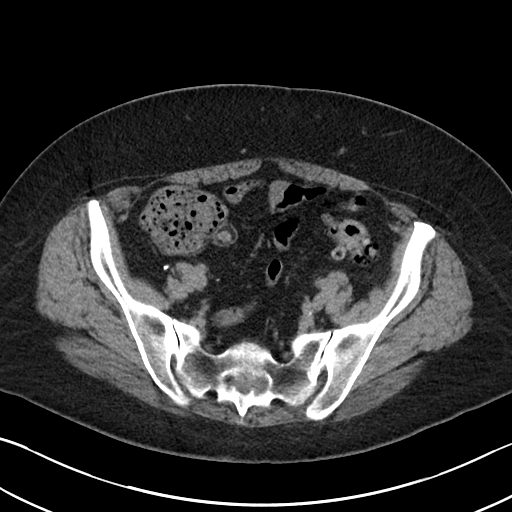
[im 40/93  soft-tissue]
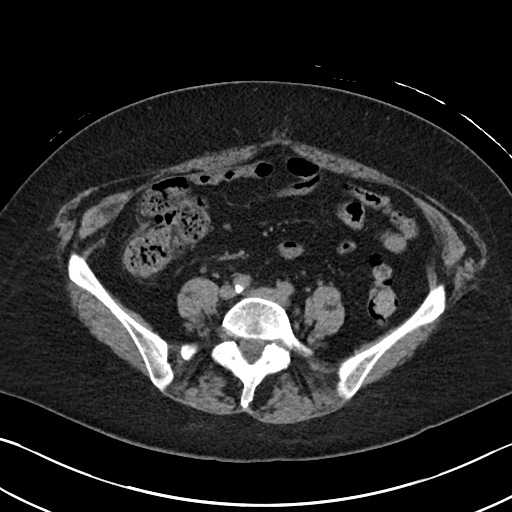
[im 49/93  soft-tissue]
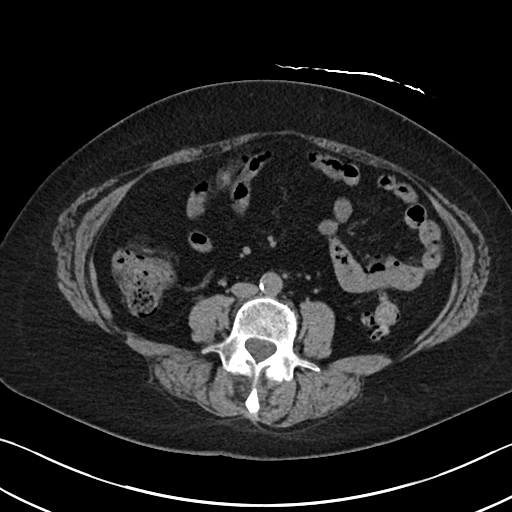
[im 53/93  soft-tissue]
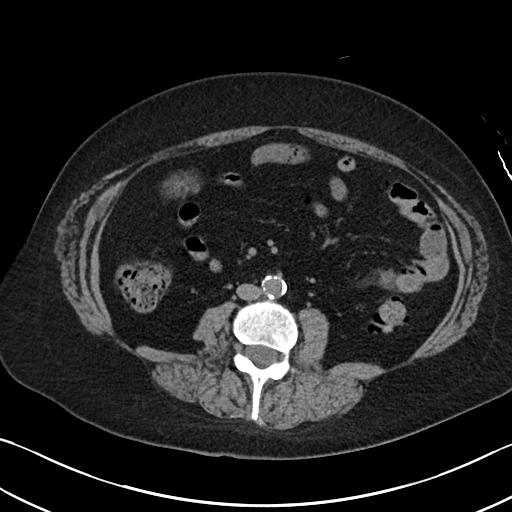
[im 62/93  soft-tissue]
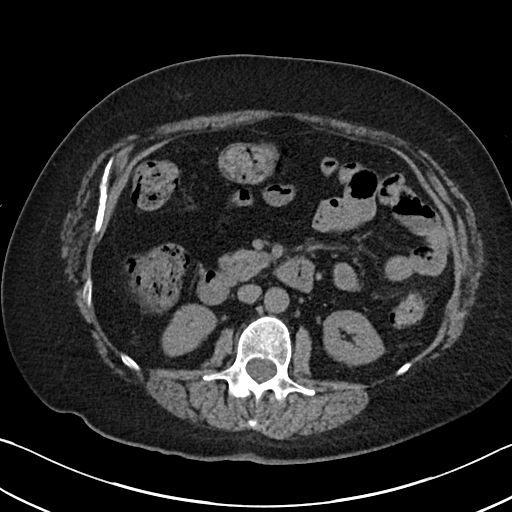
[im 62/93  bone]
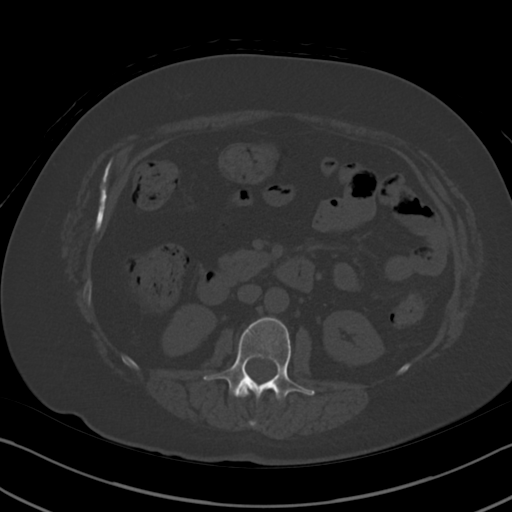
[im 66/93  soft-tissue]
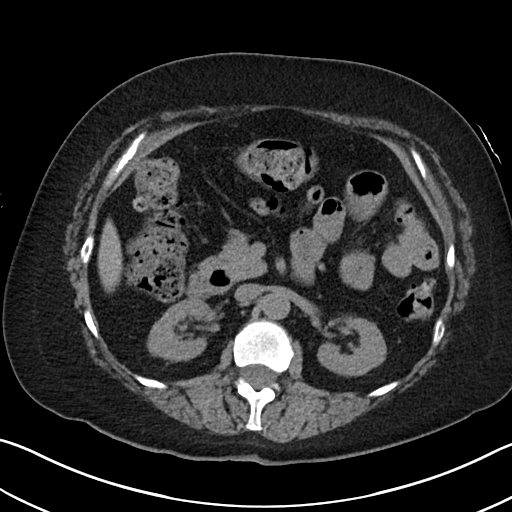
[im 75/93  soft-tissue]
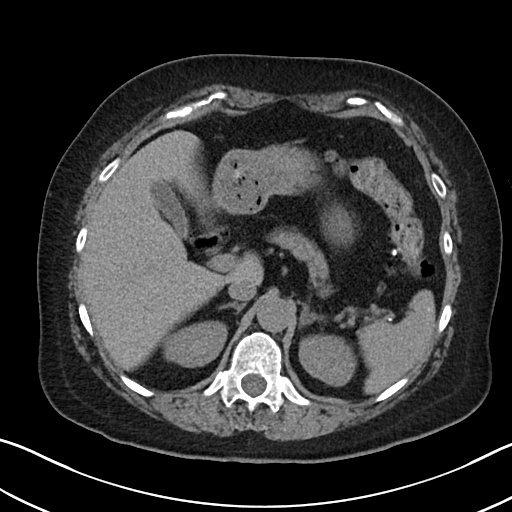
[im 79/93  soft-tissue]
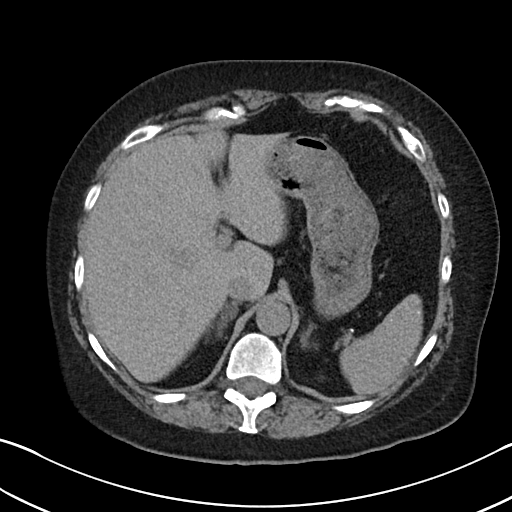
[im 88/93  soft-tissue]
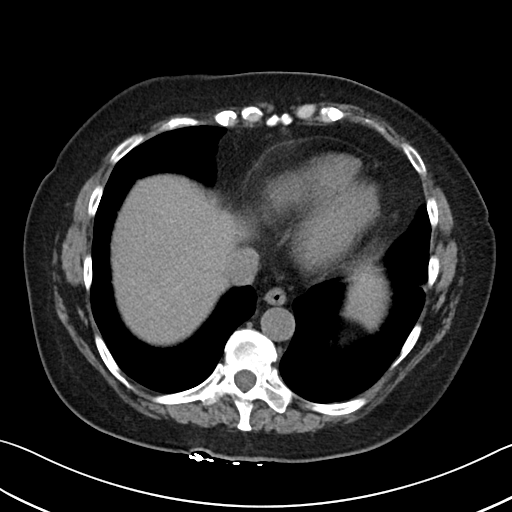

[Series 6: cor · coronal · 0.72mm/px · 3 of 99 slices shown]
[im 33/99  soft-tissue]
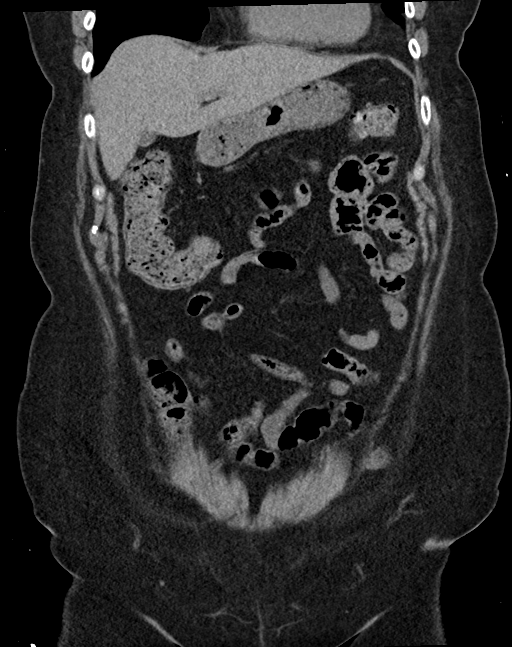
[im 44/99  soft-tissue]
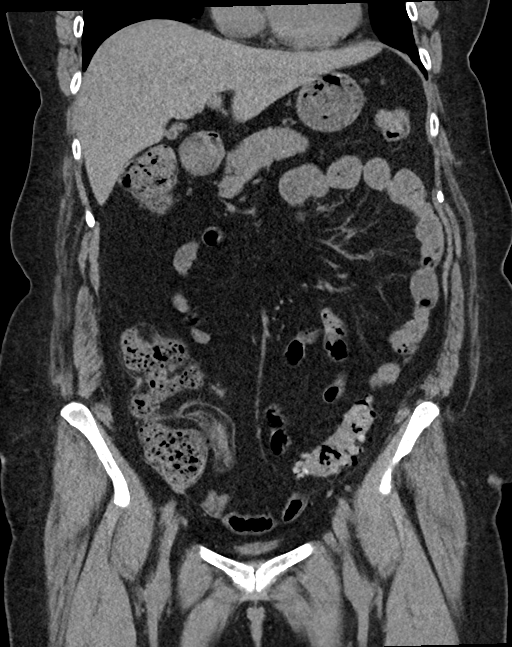
[im 55/99  soft-tissue]
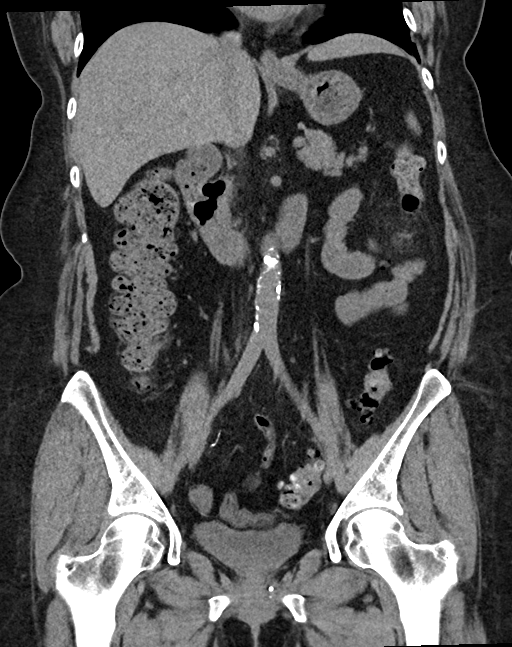

[16 of 46 positions shown; findings below may reference images not displayed]

FINDINGS: Lower chest: No acute abnormality.

Hepatobiliary: No focal liver abnormality. No gallstones,
gallbladder wall thickening, or pericholecystic fluid. No biliary
dilatation.

Pancreas: No focal lesion. Normal pancreatic contour. No surrounding
inflammatory changes. No main pancreatic ductal dilatation.

Spleen: Likely sequelae of prior granulomatous disease. Normal in
size without focal abnormality.

Adrenals/Urinary Tract:

No adrenal nodule bilaterally.

No nephrolithiasis and no hydronephrosis. No definite
contour-deforming renal mass.

No ureterolithiasis or hydroureter.

The urinary bladder is unremarkable.

Stomach/Bowel: Stomach is within normal limits. No evidence of bowel
wall thickening or dilatation. Second portion of the duodenum
diverticula. Diffuse colonic diverticulosis. Status post
appendectomy.

Vascular/Lymphatic: No abdominal aorta or iliac aneurysm. Mild to
moderate atherosclerotic plaque of the aorta and its branches. No
abdominal, pelvic, or inguinal lymphadenopathy.

Reproductive: Prostate is unremarkable.

Other: No intraperitoneal free fluid. No intraperitoneal free gas.
No organized fluid collection.

Musculoskeletal:

No abdominal wall hernia or abnormality.

No suspicious lytic or blastic osseous lesions. No acute displaced
fracture. Multilevel degenerative changes of the spine.
IMPRESSION: 1. No nephroureterolithiasis.
2. Status post appendectomy.
3. Diffuse colonic diverticulosis with no acute diverticulitis.

## 2020-12-13 NOTE — ED Triage Notes (Signed)
Pt reports right flank pain X1 month.  Pt believed it was "bone pain" but it continues.  Some painful urination

## 2020-12-13 NOTE — ED Provider Notes (Signed)
Emergency Medicine Provider Triage Evaluation Note  Kathleen Sweeney , a 60 y.o. female  was evaluated in triage.  Pt complains of right-sided flank pain with radiation down the back and into the abdomen has been ongoing for months.  She had MRIs of the spine and CT abdomen pelvis with no significant findings.  She has had some dysuria and foul odor to her urine recently.  No fever or chills.  Review of Systems  Positive:  Negative: See above   Physical Exam  BP 110/90 (BP Location: Right Arm)   Pulse 82   Temp 98.3 F (36.8 C) (Oral)   Resp 20   Ht 5\' 8"  (1.727 m)   Wt 83.9 kg   SpO2 94%   BMI 28.13 kg/m  Gen:   Awake, no distress   Resp:  Normal effort  MSK:   Moves extremities without difficulty  Other:  Right CVA tenderness.  No abdominal tenderness  Medical Decision Making  Medically screening exam initiated at 9:59 PM.  Appropriate orders placed.  ASIAH BROWDER was informed that the remainder of the evaluation will be completed by another provider, this initial triage assessment does not replace that evaluation, and the importance of remaining in the ED until their evaluation is complete.     Hendricks Limes, PA-C 12/13/20 2201    Lucrezia Starch, MD 12/16/20 [phone]

## 2020-12-14 MED ORDER — CEPHALEXIN 500 MG PO CAPS: 500.0000 mg | ORAL_CAPSULE | Freq: Four times a day (QID) | ORAL | 0 refills | Status: DC

## 2020-12-14 MED ORDER — ONDANSETRON HCL 4 MG/2ML IJ SOLN
4.0000 mg | Freq: Once | INTRAMUSCULAR | Status: AC
  Administered 2020-12-14: 4 mg via INTRAVENOUS
  Filled 2020-12-14: qty 2

## 2020-12-14 MED ORDER — LIDOCAINE 5 % EX PTCH: 1.0000 | MEDICATED_PATCH | CUTANEOUS | 0 refills | Status: DC

## 2020-12-14 MED ORDER — FLUCONAZOLE 200 MG PO TABS: 200.0000 mg | ORAL_TABLET | Freq: Every day | ORAL | 0 refills | Status: AC

## 2020-12-14 MED ORDER — ONDANSETRON HCL 4 MG PO TABS: 4.0000 mg | ORAL_TABLET | Freq: Four times a day (QID) | ORAL | 0 refills | Status: DC

## 2020-12-14 MED ORDER — HYDROMORPHONE HCL 1 MG/ML IJ SOLN
0.5000 mg | Freq: Once | INTRAMUSCULAR | Status: AC
  Administered 2020-12-14: 0.5 mg via INTRAVENOUS
  Filled 2020-12-14: qty 1

## 2020-12-14 MED ORDER — CYCLOBENZAPRINE HCL 10 MG PO TABS: 10.0000 mg | ORAL_TABLET | Freq: Two times a day (BID) | ORAL | 0 refills | Status: DC | PRN

## 2020-12-14 MED ORDER — LIDOCAINE 5 % EX PTCH
1.0000 | MEDICATED_PATCH | CUTANEOUS | Status: DC
  Administered 2020-12-14: 1 via TRANSDERMAL
  Filled 2020-12-14: qty 1

## 2020-12-14 MED ORDER — DICLOFENAC SODIUM 1 % EX GEL: 4.0000 g | Freq: Four times a day (QID) | CUTANEOUS | 1 refills | Status: DC

## 2020-12-14 NOTE — ED Notes (Signed)
EDP at [facility], family at [facility]. Pt tearful.

## 2020-12-14 NOTE — Discharge Instructions (Addendum)
In addition to the prescriptions you can also try tylenol (2 extra strength).  Think you need a second opinion and have your back looked at again.  The symptoms you describe seem to be in the muscle or coming from the back.  Continue to be active as best as you can.  Try the new therapies prescribed to you.  Neurosurgery information is on your chart.  Also make sure you are following up with your regular doctor to ensure that the urinary tract infection resolves.

## 2020-12-14 NOTE — ED Provider Notes (Signed)
[facility] EMERGENCY DEPARTMENT Provider Note   CSN: [id number] Arrival date & time: 12/13/20  1932     History Chief Complaint  Patient presents with   Flank Pain    MALAIYAH ACHORN is a 60 y.o. female.  Patient is a 60 year old female with a history of hypertension, hyperlipidemia, hypothyroidism, lupus and CKD who presents today complaining of ongoing pain in the right side of her back and for the last 2 to 3 days frequency, urgency and dysuria.  Patient reports the back pain has been present for a while now.  It is always on the right side.  It is exacerbated by any type of bending or twisting movement.  When she is able to get up and walk the pain is tolerable or if she is laying down with her legs elevated.  She is now starting to feel like her right leg is starting to be involved and there is been some mild weakness but denies any foot drop or inability to lift her leg.  She has had constipation but has not had any fecal incontinence.  No urinary retention but over the last 2 to 3 days she has noticed some burning in discomfort with urinating.  She has not seen an orthopedic surgeon about this back pain in July where she had a MRI of her lumbar spine that showed no acute cause for her pain.  She reported they talked about epidural injections and possible physical therapy but because she was dealing with diverticulitis she did not get around to scheduling any of it and has just continued to have pain.  She is not taking anything for the pain because of her prior issues with kidney injury due to ibuprofen and dehydration.  She is not taking any opiates she is not even using over-the-counter Tylenol or topical therapies.  The pain with laying still is very tolerable and only 2 or 3 out of 10 but with any type of movement the pain is a 10 out of 10 and brings her to tears.  She has no shortness of breath, chest pain the pain does not radiate up her back.  She cannot recall any  trauma.  The history is provided by the patient and the spouse.  Flank Pain      Past Medical History:  Diagnosis Date   CKD (chronic kidney disease)    Depression    Facial droop    Fibromyalgia    Hyperlipidemia    Hypertension    Hypothyroid    IBS (irritable bowel syndrome)    Insomnia    Lupus (HCC)    Migraine    RLS (restless legs syndrome)    Skin cancer Had cancer removed in 2000   Thyroid disease     Patient Active Problem List   Diagnosis Date Noted   Sleep apnea, obstructive 10/09/2019   Snoring 10/09/2019   Major depressive disorder, recurrent episode, severe ([place]) 08/29/2019   Generalized anxiety disorder 08/29/2019   Brief psychotic disorder ([place]) 08/29/2019   Altered mental status 08/27/2019   Carbon monoxide poisoning 08/26/2019   Anxiety 08/26/2019   Left-sided weakness 08/26/2019   Acute diverticulitis 08/10/2019   Hypertension    Hyperlipidemia    Insomnia    Diverticulitis    Nausea & vomiting 04/27/2011   Migraine    Hypothyroid    Fibromyalgia    Depression     Past Surgical History:  Procedure Laterality Date   ABDOMINAL HYSTERECTOMY  2008  APPENDECTOMY  1998   thoracic tumor  03/1995   excision     OB History   No obstetric history on file.     Family History  Problem Relation Age of Onset   Sleep apnea Mother    Irregular heart beat Mother        A fib   Hypertension Mother    Diabetes Mother    Dementia Mother    Pneumonia Father    Sleep apnea Brother    Dementia Maternal Grandmother     Social History   Tobacco Use   Smoking status: Former    Packs/day: 2.00    Years: 30.00    Pack years: 60.00    Types: Cigarettes    Quit date: 06/23/2006    Years since quitting: 14.4   Smokeless tobacco: Never  Vaping Use   Vaping Use: Never used  Substance Use Topics   Alcohol use: No   Drug use: Not Currently    Types: Marijuana, Other-see comments    Comment: CBD oil, last used 2020    Home  Medications Prior to Admission medications   Medication Sig Start Date End Date Taking? Authorizing Provider  amoxicillin-clavulanate (AUGMENTIN) 875-125 MG tablet SMARTSIG:1 Tablet(s) By Mouth Every 12 Hours 10/02/19   [provider]  levothyroxine (SYNTHROID) 75 MCG tablet Take 1 tablet (75 mcg total) by mouth daily at 6 (six) AM. 09/01/19   Connye Burkitt, NP  LORazepam (ATIVAN) 1 MG tablet Take 1 tablet (1 mg total) by mouth at bedtime. 08/31/19   Connye Burkitt, NP  OLANZapine zydis (ZYPREXA) 15 MG disintegrating tablet Take 1 tablet (15 mg total) by mouth at bedtime. 08/31/19   Connye Burkitt, NP  ondansetron (ZOFRAN) 8 MG tablet Take 8 mg by mouth 2 (two) times daily as needed. 10/02/19   [provider]  pravastatin (PRAVACHOL) 10 MG tablet Take 10 mg by mouth daily. 10/02/19   [provider]  tiZANidine (ZANAFLEX) 4 MG tablet Take 8-12 mg by mouth at bedtime. As needed 08/31/19   [provider]  zolpidem (AMBIEN) 5 MG tablet Take 1 tablet (5 mg total) by mouth at bedtime as needed for sleep. 08/31/19   Connye Burkitt, NP    Allergies    Erythromycin, Morphine and related, Promethazine, and Simvastatin  Review of Systems   Review of Systems  Genitourinary:  Positive for flank pain.  All other systems reviewed and are negative.  Physical Exam Updated Vital Signs BP 132/78   Pulse 75   Temp (!) 97.5 F (36.4 C) (Oral)   Resp 16   Ht 5\' 8"  (1.727 m)   Wt 83.9 kg   SpO2 97%   BMI 28.13 kg/m   Physical Exam Vitals and nursing note reviewed.  Constitutional:      General: She is not in acute distress.    Appearance: Normal appearance. She is well-developed.  HENT:     Head: Normocephalic and atraumatic.  Eyes:     Pupils: Pupils are equal, round, and reactive to light.  Cardiovascular:     Rate and Rhythm: Normal rate and regular rhythm.     Heart sounds: Normal heart sounds. No murmur heard.   No friction rub.  Pulmonary:     Effort:  Pulmonary effort is normal.     Breath sounds: Normal breath sounds. No wheezing or rales.  Abdominal:     General: Bowel sounds are normal. There is no distension.  Palpations: Abdomen is soft.     Tenderness: There is no abdominal tenderness. There is no right CVA tenderness, guarding or rebound.  Musculoskeletal:        General: Tenderness present. Normal range of motion.     Cervical back: Normal range of motion.       Back:     Comments: No edema  Skin:    General: Skin is warm and dry.     Findings: No rash.  Neurological:     Mental Status: She is alert and oriented to person, place, and time.     Cranial Nerves: No cranial nerve deficit.     Comments: Full rom in bilateral lower ext.  Straight leg neg.  Normal strength in the right leg and sensation.  Psychiatric:        Behavior: Behavior normal.    ED Results / Procedures / Treatments   Labs (all labs ordered are listed, but only abnormal results are displayed) Labs Reviewed  BASIC METABOLIC PANEL - Abnormal; Notable for the following components:      Result Value   Glucose, Bld 114 (*)    Creatinine, Ser 1.26 (*)    GFR, Estimated 48 (*)    All other components within normal limits  URINALYSIS, ROUTINE W REFLEX MICROSCOPIC - Abnormal; Notable for the following components:   APPearance HAZY (*)    Nitrite POSITIVE (*)    Leukocytes,Ua LARGE (*)    Bacteria, UA FEW (*)    All other components within normal limits  CBC WITH DIFFERENTIAL/PLATELET    EKG None  Radiology CT Renal Stone Study  Result Date: 12/13/2020 CLINICAL DATA:  Flank pain, kidney stone suspected right flank pain EXAM: CT ABDOMEN AND PELVIS WITHOUT CONTRAST TECHNIQUE: Multidetector CT imaging of the abdomen and pelvis was performed following the standard protocol without IV contrast. COMPARISON:  None. FINDINGS: Lower chest: No acute abnormality. Hepatobiliary: No focal liver abnormality. No gallstones, gallbladder wall thickening, or  pericholecystic fluid. No biliary dilatation. Pancreas: No focal lesion. Normal pancreatic contour. No surrounding inflammatory changes. No main pancreatic ductal dilatation. Spleen: Likely sequelae of prior granulomatous disease. Normal in size without focal abnormality. Adrenals/Urinary Tract: No adrenal nodule bilaterally. No nephrolithiasis and no hydronephrosis. No definite contour-deforming renal mass. No ureterolithiasis or hydroureter. The urinary bladder is unremarkable. Stomach/Bowel: Stomach is within normal limits. No evidence of bowel wall thickening or dilatation. Second portion of the duodenum diverticula. Diffuse colonic diverticulosis. Status post appendectomy. Vascular/Lymphatic: No abdominal aorta or iliac aneurysm. Mild to moderate atherosclerotic plaque of the aorta and its branches. No abdominal, pelvic, or inguinal lymphadenopathy. Reproductive: Prostate is unremarkable. Other: No intraperitoneal free fluid. No intraperitoneal free gas. No organized fluid collection. Musculoskeletal: No abdominal wall hernia or abnormality. No suspicious lytic or blastic osseous lesions. No acute displaced fracture. Multilevel degenerative changes of the spine. IMPRESSION: 1. No nephroureterolithiasis. 2. Status post appendectomy. 3. Diffuse colonic diverticulosis with no acute diverticulitis. Electronically Signed   By: Iven Finn M.D.   On: 12/13/2020 22:44    Procedures Procedures   Medications Ordered in ED Medications  HYDROmorphone (DILAUDID) injection 0.5 mg (has no administration in time range)  ondansetron (ZOFRAN) injection 4 mg (has no administration in time range)  lidocaine (LIDODERM) 5 % 1 patch (has no administration in time range)    ED Course  I have reviewed the triage vital signs and the nursing notes.  Pertinent labs & imaging results that were available during my care of the patient  were reviewed by me and considered in my medical decision making (see chart for  details).    MDM Rules/Calculators/A&P                           Patient is a 60 year old female presenting today with complaints of ongoing back pain.  This back pain has been present for months but is just becoming more severe.  She has followed up with an orthopedist and did have an MRI in July.  No finding identified as the cause of her pain.  However because of dealing with other medical issues she was not able to sign up and complete any physical therapy.  She has not been taking any medication for this.  In addition to these issues she is started having dysuria over the last few days.  Patient does have evidence of a UTI today.  However CT was negative and there is no findings to suggest pyelonephritis, diverticulitis, appendicitis, mass or acute obstructive process.  No findings to suggest kidney stones.  Renal function today is baseline at 1.26, CBC within normal limits.  Looking back an MRI done in July showed multiple areas of disc protrusion but no central canal narrowing or significant findings.  However discussed with patient that symptoms can change especially since now she is having symptoms going down her right leg.  She wishes a second opinion and patient given resources to neurosurgery.  We will treat with antibiotics.  Patient started on lidocaine patches, diclofenac gel and muscle relaxer.  MDM   Amount and/or Complexity of Data Reviewed Clinical lab tests: ordered and reviewed Tests in the radiology section of CPT: ordered and reviewed Independent visualization of images, tracings, or specimens: yes     Final Clinical Impression(s) / ED Diagnoses Final diagnoses:  Chronic right-sided low back pain with right-sided sciatica  Acute cystitis with hematuria    Rx / DC Orders ED Discharge Orders          Ordered    cyclobenzaprine (FLEXERIL) 10 MG tablet  2 times daily PRN        12/14/20 1540    lidocaine (LIDODERM) 5 %  Every 24 hours        12/14/20 1540     diclofenac Sodium (VOLTAREN) 1 % GEL  4 times daily        12/14/20 1540    fluconazole (DIFLUCAN) 200 MG tablet  Daily        12/14/20 1540    cephALEXin (KEFLEX) 500 MG capsule  4 times daily        12/14/20 1540             Blanchie Dessert, MD 12/14/20 1541

## 2020-12-26 DIAGNOSIS — R300 Dysuria: Secondary | ICD-10-CM | POA: Diagnosis not present

## 2020-12-26 DIAGNOSIS — M545 Low back pain, unspecified: Secondary | ICD-10-CM | POA: Diagnosis not present

## 2020-12-26 DIAGNOSIS — M79606 Pain in leg, unspecified: Secondary | ICD-10-CM | POA: Diagnosis not present

## 2021-01-04 DIAGNOSIS — M47818 Spondylosis without myelopathy or radiculopathy, sacral and sacrococcygeal region: Secondary | ICD-10-CM | POA: Diagnosis not present

## 2021-01-17 DIAGNOSIS — E039 Hypothyroidism, unspecified: Secondary | ICD-10-CM | POA: Diagnosis not present

## 2021-01-18 DIAGNOSIS — F419 Anxiety disorder, unspecified: Secondary | ICD-10-CM | POA: Diagnosis not present

## 2021-01-18 DIAGNOSIS — G4700 Insomnia, unspecified: Secondary | ICD-10-CM | POA: Diagnosis not present

## 2021-01-26 DIAGNOSIS — M47816 Spondylosis without myelopathy or radiculopathy, lumbar region: Secondary | ICD-10-CM | POA: Diagnosis not present

## 2021-01-26 DIAGNOSIS — M461 Sacroiliitis, not elsewhere classified: Secondary | ICD-10-CM | POA: Diagnosis not present

## 2021-01-26 DIAGNOSIS — M5136 Other intervertebral disc degeneration, lumbar region: Secondary | ICD-10-CM | POA: Diagnosis not present

## 2021-02-16 DIAGNOSIS — B9620 Unspecified Escherichia coli [E. coli] as the cause of diseases classified elsewhere: Secondary | ICD-10-CM | POA: Diagnosis not present

## 2021-02-16 DIAGNOSIS — R351 Nocturia: Secondary | ICD-10-CM | POA: Diagnosis not present

## 2021-02-16 DIAGNOSIS — N390 Urinary tract infection, site not specified: Secondary | ICD-10-CM | POA: Diagnosis not present

## 2021-02-16 DIAGNOSIS — R350 Frequency of micturition: Secondary | ICD-10-CM | POA: Diagnosis not present

## 2021-02-23 DIAGNOSIS — R110 Nausea: Secondary | ICD-10-CM | POA: Diagnosis not present

## 2021-02-23 DIAGNOSIS — R739 Hyperglycemia, unspecified: Secondary | ICD-10-CM | POA: Diagnosis not present

## 2021-02-23 DIAGNOSIS — I959 Hypotension, unspecified: Secondary | ICD-10-CM | POA: Diagnosis not present

## 2021-04-10 DIAGNOSIS — G4700 Insomnia, unspecified: Secondary | ICD-10-CM | POA: Diagnosis not present

## 2021-04-10 DIAGNOSIS — F419 Anxiety disorder, unspecified: Secondary | ICD-10-CM | POA: Diagnosis not present

## 2021-05-18 DIAGNOSIS — G4700 Insomnia, unspecified: Secondary | ICD-10-CM | POA: Diagnosis not present

## 2021-05-18 DIAGNOSIS — F419 Anxiety disorder, unspecified: Secondary | ICD-10-CM | POA: Diagnosis not present

## 2021-05-25 DIAGNOSIS — B379 Candidiasis, unspecified: Secondary | ICD-10-CM | POA: Diagnosis not present

## 2021-05-25 DIAGNOSIS — T3695XA Adverse effect of unspecified systemic antibiotic, initial encounter: Secondary | ICD-10-CM | POA: Diagnosis not present

## 2021-05-25 DIAGNOSIS — N3001 Acute cystitis with hematuria: Secondary | ICD-10-CM | POA: Diagnosis not present

## 2021-06-01 DIAGNOSIS — F5101 Primary insomnia: Secondary | ICD-10-CM | POA: Diagnosis not present

## 2021-06-01 DIAGNOSIS — F411 Generalized anxiety disorder: Secondary | ICD-10-CM | POA: Diagnosis not present

## 2021-06-05 DIAGNOSIS — M47818 Spondylosis without myelopathy or radiculopathy, sacral and sacrococcygeal region: Secondary | ICD-10-CM | POA: Diagnosis not present

## 2021-06-05 DIAGNOSIS — N3000 Acute cystitis without hematuria: Secondary | ICD-10-CM | POA: Diagnosis not present

## 2021-06-05 DIAGNOSIS — R300 Dysuria: Secondary | ICD-10-CM | POA: Diagnosis not present

## 2021-06-05 DIAGNOSIS — Z6827 Body mass index (BMI) 27.0-27.9, adult: Secondary | ICD-10-CM | POA: Diagnosis not present

## 2021-06-23 DIAGNOSIS — M47818 Spondylosis without myelopathy or radiculopathy, sacral and sacrococcygeal region: Secondary | ICD-10-CM | POA: Diagnosis not present

## 2021-06-29 DIAGNOSIS — R351 Nocturia: Secondary | ICD-10-CM | POA: Diagnosis not present

## 2021-06-29 DIAGNOSIS — N390 Urinary tract infection, site not specified: Secondary | ICD-10-CM | POA: Diagnosis not present

## 2021-06-29 DIAGNOSIS — B9689 Other specified bacterial agents as the cause of diseases classified elsewhere: Secondary | ICD-10-CM | POA: Diagnosis not present

## 2021-07-14 DIAGNOSIS — M47818 Spondylosis without myelopathy or radiculopathy, sacral and sacrococcygeal region: Secondary | ICD-10-CM | POA: Diagnosis not present

## 2021-07-14 DIAGNOSIS — Z6827 Body mass index (BMI) 27.0-27.9, adult: Secondary | ICD-10-CM | POA: Diagnosis not present

## 2021-07-21 ENCOUNTER — Emergency Department (HOSPITAL_BASED_OUTPATIENT_CLINIC_OR_DEPARTMENT_OTHER)
Admission: EM | Admit: 2021-07-21 | Discharge: 2021-07-21 | Disposition: A | Discharge status: Home / Self Care | Payer: BC Managed Care – PPO | Attending: Emergency Medicine | Admitting: Emergency Medicine

## 2021-07-21 ENCOUNTER — Emergency Department (HOSPITAL_BASED_OUTPATIENT_CLINIC_OR_DEPARTMENT_OTHER): Payer: BC Managed Care – PPO

## 2021-07-21 ENCOUNTER — Other Ambulatory Visit: Payer: Self-pay

## 2021-07-21 ENCOUNTER — Encounter (HOSPITAL_BASED_OUTPATIENT_CLINIC_OR_DEPARTMENT_OTHER): Payer: Self-pay | Admitting: Emergency Medicine

## 2021-07-21 DIAGNOSIS — E1122 Type 2 diabetes mellitus with diabetic chronic kidney disease: Secondary | ICD-10-CM | POA: Insufficient documentation

## 2021-07-21 DIAGNOSIS — N189 Chronic kidney disease, unspecified: Secondary | ICD-10-CM | POA: Diagnosis not present

## 2021-07-21 DIAGNOSIS — K5730 Diverticulosis of large intestine without perforation or abscess without bleeding: Secondary | ICD-10-CM | POA: Diagnosis not present

## 2021-07-21 DIAGNOSIS — Z79899 Other long term (current) drug therapy: Secondary | ICD-10-CM | POA: Diagnosis not present

## 2021-07-21 DIAGNOSIS — K5792 Diverticulitis of intestine, part unspecified, without perforation or abscess without bleeding: Secondary | ICD-10-CM | POA: Diagnosis not present

## 2021-07-21 DIAGNOSIS — E039 Hypothyroidism, unspecified: Secondary | ICD-10-CM | POA: Insufficient documentation

## 2021-07-21 DIAGNOSIS — Z87891 Personal history of nicotine dependence: Secondary | ICD-10-CM | POA: Diagnosis not present

## 2021-07-21 DIAGNOSIS — E1140 Type 2 diabetes mellitus with diabetic neuropathy, unspecified: Secondary | ICD-10-CM | POA: Diagnosis not present

## 2021-07-21 DIAGNOSIS — R1032 Left lower quadrant pain: Secondary | ICD-10-CM | POA: Diagnosis not present

## 2021-07-21 LAB — CBC
HCT: 41.9 % (ref 36.0–46.0)
Hemoglobin: 13.1 g/dL (ref 12.0–15.0)
MCH: 26.5 pg (ref 26.0–34.0)
MCHC: 31.3 g/dL (ref 30.0–36.0)
MCV: 84.6 fL (ref 80.0–100.0)
Platelets: 258 10*3/uL (ref 150–400)
RBC: 4.95 MIL/uL (ref 3.87–5.11)
RDW: 13.7 % (ref 11.5–15.5)
WBC: 10.4 10*3/uL (ref 4.0–10.5)
nRBC: 0 % (ref 0.0–0.2)

## 2021-07-21 LAB — URINALYSIS, ROUTINE W REFLEX MICROSCOPIC
Bilirubin Urine: NEGATIVE
Glucose, UA: NEGATIVE mg/dL
Hgb urine dipstick: NEGATIVE
Ketones, ur: NEGATIVE mg/dL
Leukocytes,Ua: NEGATIVE
Nitrite: NEGATIVE
Protein, ur: NEGATIVE mg/dL
Specific Gravity, Urine: 1.015 (ref 1.005–1.030)
pH: 6 (ref 5.0–8.0)

## 2021-07-21 LAB — COMPREHENSIVE METABOLIC PANEL
ALT: 12 U/L (ref 0–44)
AST: 14 U/L — ABNORMAL LOW (ref 15–41)
Albumin: 4.1 g/dL (ref 3.5–5.0)
Alkaline Phosphatase: 85 U/L (ref 38–126)
Anion gap: 11 (ref 5–15)
BUN: 15 mg/dL (ref 8–23)
CO2: 29 mmol/L (ref 22–32)
Calcium: 10.1 mg/dL (ref 8.9–10.3)
Chloride: 98 mmol/L (ref 98–111)
Creatinine, Ser: 0.8 mg/dL (ref 0.44–1.00)
GFR, Estimated: >60 mL/min (ref >60)
Glucose, Bld: 86 mg/dL (ref 70–99)
Potassium: 4.3 mmol/L (ref 3.5–5.1)
Sodium: 138 mmol/L (ref 135–145)
Total Bilirubin: 0.7 mg/dL (ref 0.3–1.2)
Total Protein: 8.1 g/dL (ref 6.5–8.1)

## 2021-07-21 LAB — LIPASE, BLOOD: Lipase: 11 U/L (ref 11–51)

## 2021-07-21 IMAGING — CT CT ABD-PELV W/ CM
2 of 5 series · 16 of 46 positions shown, 18 images · IV contrast (APPLIED)
Comparison: [DATE] CT abdomen pelvis without contrast

CLINICAL DATA: Left lower quadrant pain

EXAM:
CT ABDOMEN AND PELVIS WITH CONTRAST
TECHNIQUE: Multidetector CT imaging of the abdomen and pelvis was performed
using the standard protocol following bolus administration of
intravenous contrast.

[Series 2: abd pel w · axial · 0.73mm/px · z∈[+610,+1024]mm · 13 of 95 slices shown, 15 images]
[im 6/95  soft-tissue]
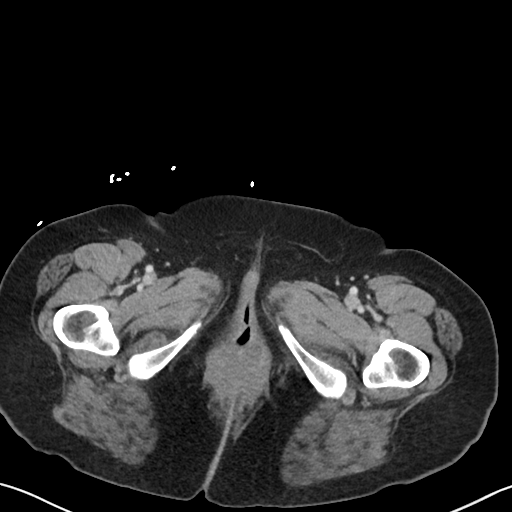
[im 6/95  bone]
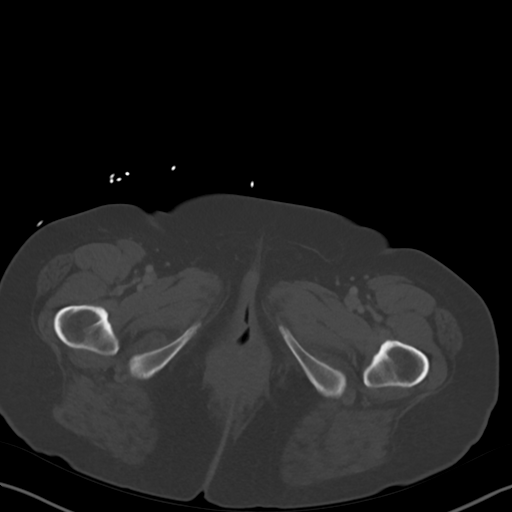
[im 11/95  soft-tissue]
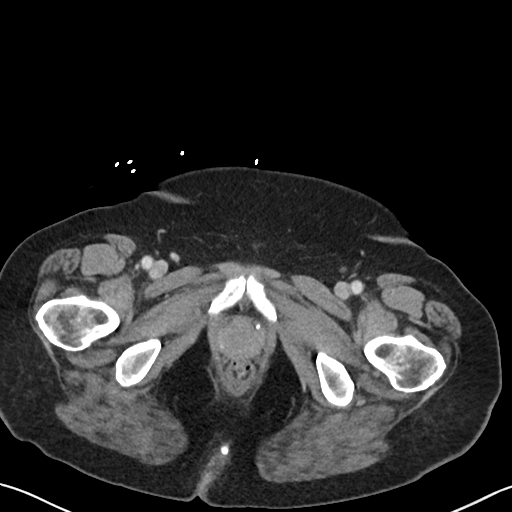
[im 21/95  soft-tissue]
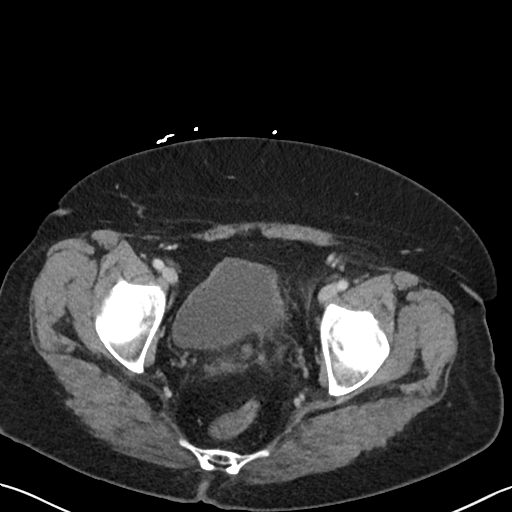
[im 27/95  soft-tissue]
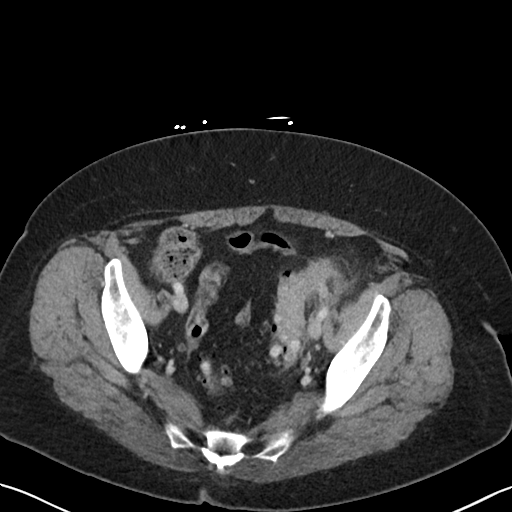
[im 32/95  soft-tissue]
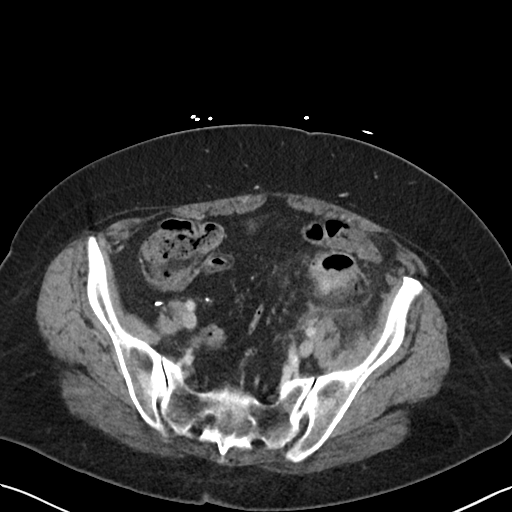
[im 42/95  soft-tissue]
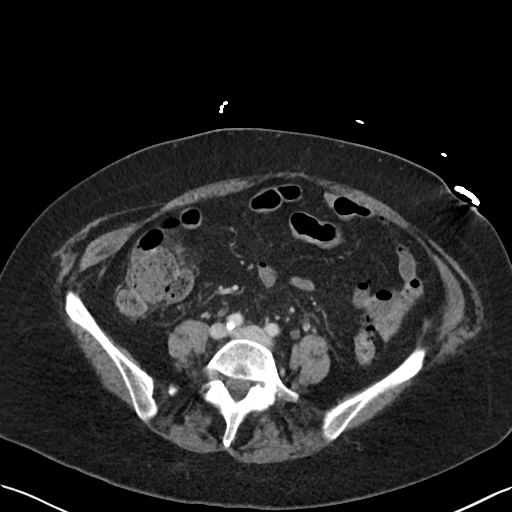
[im 48/95  soft-tissue]
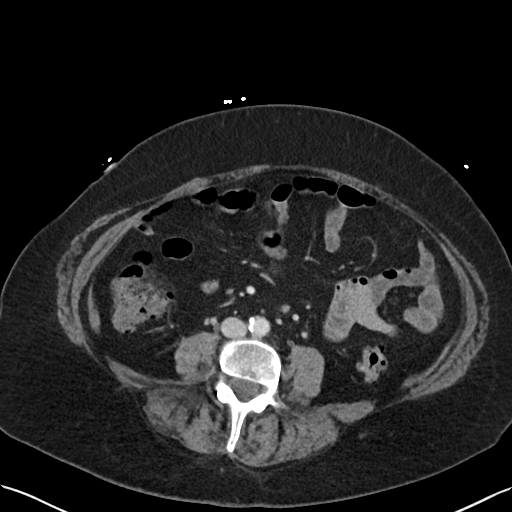
[im 53/95  soft-tissue]
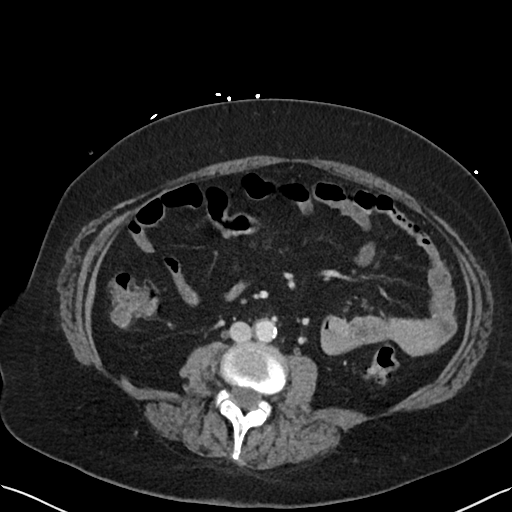
[im 63/95  soft-tissue]
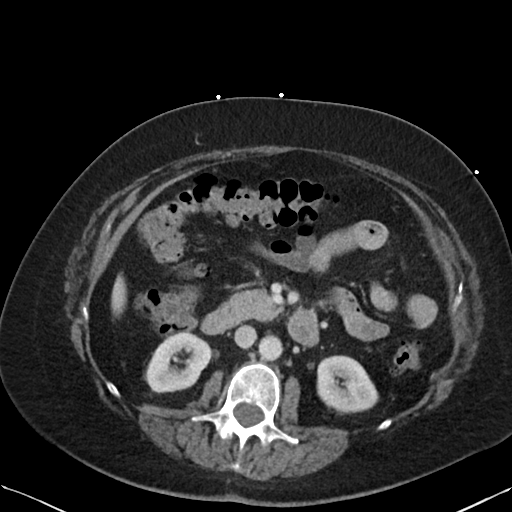
[im 63/95  bone]
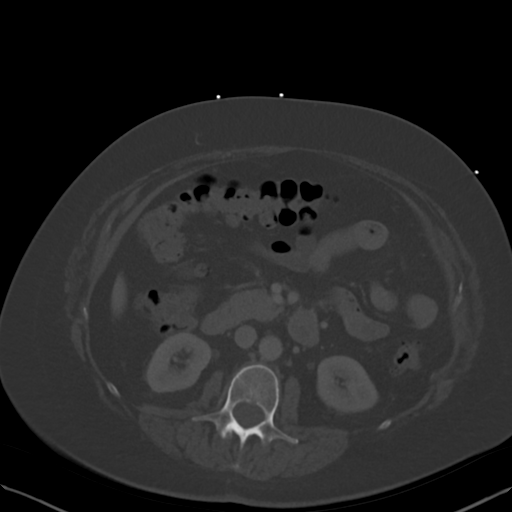
[im 68/95  soft-tissue]
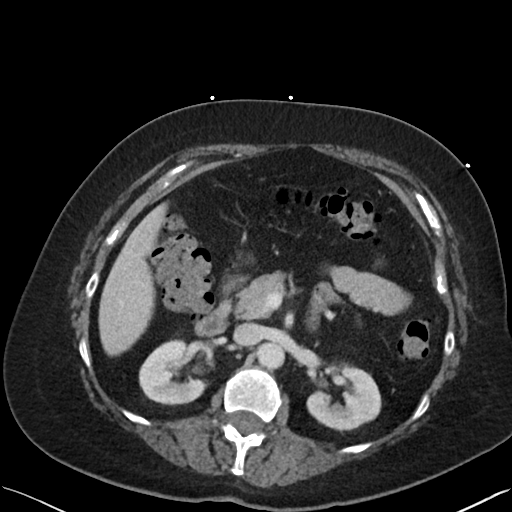
[im 74/95  soft-tissue]
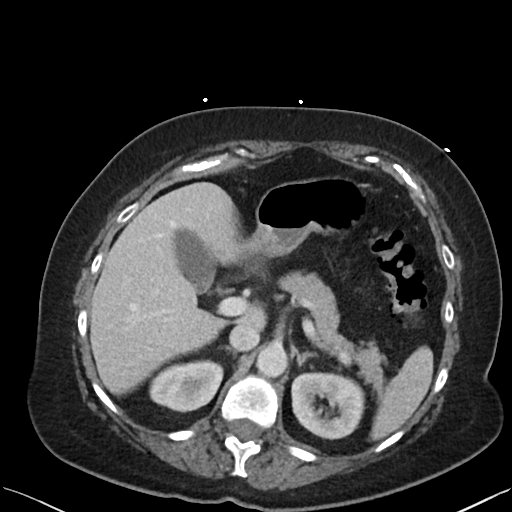
[im 84/95  soft-tissue]
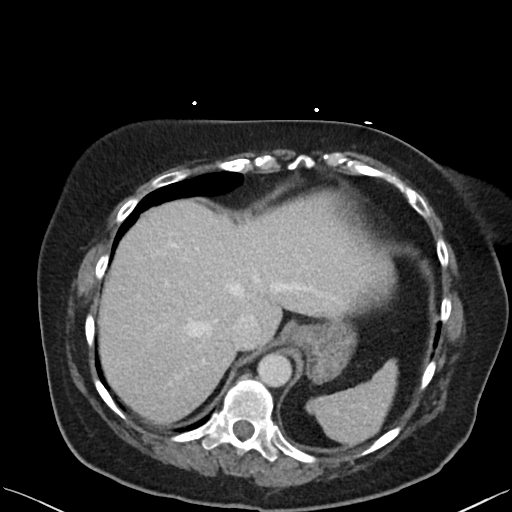
[im 89/95  soft-tissue]
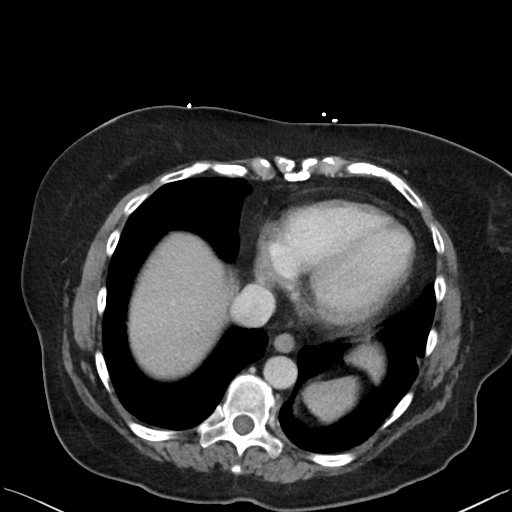

[Series 5: coronal · coronal · 0.71mm/px · 3 of 101 slices shown]
[im 34/101  soft-tissue]
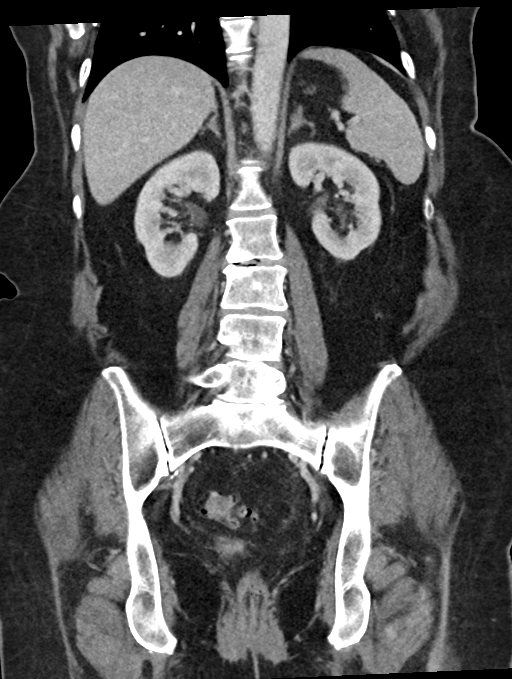
[im 45/101  soft-tissue]
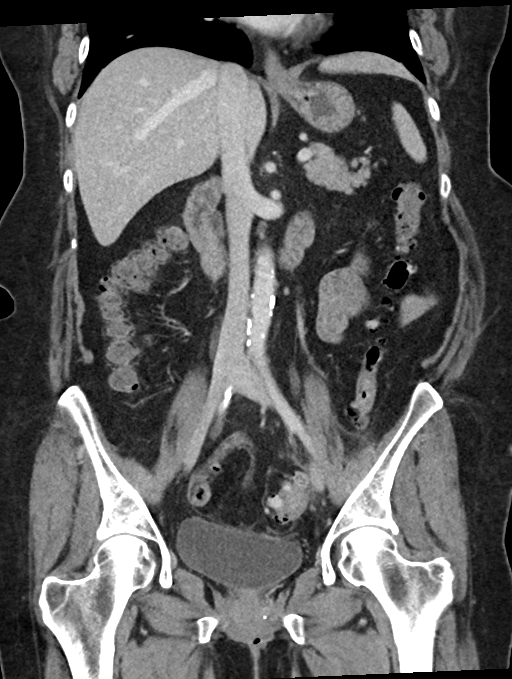
[im 56/101  soft-tissue]
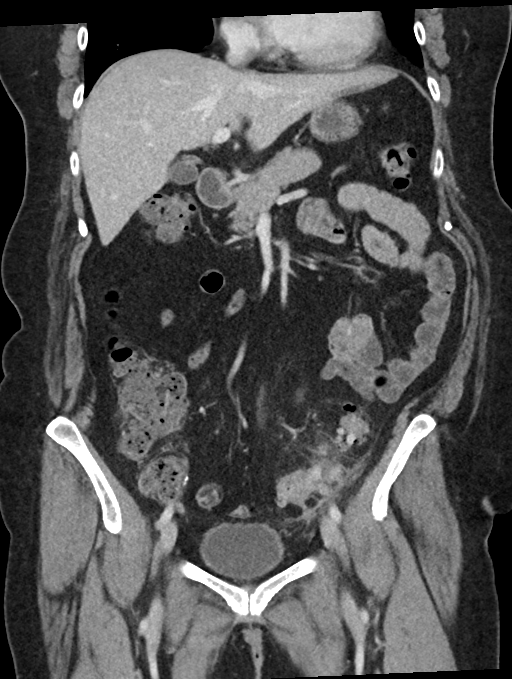

[16 of 46 positions shown; findings below may reference images not displayed]

RADIATION DOSE REDUCTION: This exam was performed according to the
departmental dose-optimization program which includes automated
exposure control, adjustment of the mA and/or kV according to
patient size and/or use of iterative reconstruction technique.

CONTRAST:  100mL OMNIPAQUE IOHEXOL 300 MG/ML  SOLN
FINDINGS: Lower chest: No focal pulmonary opacity or pleural effusion. No
pericardial effusion.

Hepatobiliary: No focal liver abnormality is seen. No gallstones,
gallbladder wall thickening, or biliary dilatation.

Pancreas: Unremarkable. No pancreatic ductal dilatation or
surrounding inflammatory changes.

Spleen: Normal in size without focal abnormality.

Adrenals/Urinary Tract: Normal left adrenal gland. Redemonstrated 11
mm right adrenal nodule, most likely an adrenal adenoma. Kidneys are
normal, without renal calculi, focal lesion, or hydronephrosis.
Bladder is unremarkable.

Stomach/Bowel: Colonic diverticulosis with pericolonic stranding
about the distal descending and proximal sigmoid colon in the left
lower quadrant (series 2, image 67 and series 5, image 58). No focal
fluid collection or free air to suggest perforation or abscess. The
stomach is unremarkable. No evidence of bowel obstruction.

Vascular/Lymphatic: Aortic atherosclerosis. No enlarged abdominal or
pelvic lymph nodes.

Reproductive: Status post hysterectomy. No adnexal masses.

Other: No free fluid or free air.

Musculoskeletal: No acute osseous abnormality.
IMPRESSION: Uncomplicated diverticulitis in the distal descending colon and
proximal sigmoid colon in the left lower quadrant. No evidence of
abscess or perforation.

## 2021-07-21 MED ORDER — ONDANSETRON HCL 4 MG PO TABS: 4.0000 mg | ORAL_TABLET | Freq: Four times a day (QID) | ORAL | 0 refills | Status: AC

## 2021-07-21 MED ORDER — ONDANSETRON HCL 4 MG/2ML IJ SOLN
4.0000 mg | Freq: Once | INTRAMUSCULAR | Status: AC
  Administered 2021-07-21: 4 mg via INTRAVENOUS
  Filled 2021-07-21: qty 2

## 2021-07-21 MED ORDER — IOHEXOL 300 MG/ML  SOLN
100.0000 mL | Freq: Once | INTRAMUSCULAR | Status: AC | PRN
  Administered 2021-07-21: 100 mL via INTRAVENOUS

## 2021-07-21 MED ORDER — LACTATED RINGERS IV BOLUS
1000.0000 mL | Freq: Once | INTRAVENOUS | Status: AC
  Administered 2021-07-21: 1000 mL via INTRAVENOUS

## 2021-07-21 MED ORDER — AMOXICILLIN-POT CLAVULANATE 875-125 MG PO TABS: 1.0000 | ORAL_TABLET | Freq: Two times a day (BID) | ORAL | 0 refills | Status: DC

## 2021-07-21 MED ORDER — ACETAMINOPHEN 325 MG PO TABS
650.0000 mg | ORAL_TABLET | Freq: Four times a day (QID) | ORAL | Status: DC | PRN
  Administered 2021-07-21: 650 mg via ORAL
  Filled 2021-07-21: qty 2

## 2021-07-21 MED ORDER — MORPHINE SULFATE (PF) 4 MG/ML IV SOLN
4.0000 mg | Freq: Once | INTRAVENOUS | Status: AC
  Administered 2021-07-21: 4 mg via INTRAVENOUS
  Filled 2021-07-21: qty 1

## 2021-07-21 NOTE — ED Notes (Signed)
Attempted to obtain urine sample from pt, however pt states she does not feel like she can urinate at this time and "usually has a hard time providing urine samples."

## 2021-07-21 NOTE — ED Provider Notes (Signed)
[facility] EMERGENCY DEPT Provider Note   CSN: [id number] Arrival date & time: 07/21/21  1101     History  Chief Complaint  Patient presents with   Diverticulitis    Kathleen Sweeney is a 61 y.o. female with history of CKD, diabetes with neuropathy, diverticulitis and hypothyroid with surgical history to include abdominal hysterectomy and appendectomy presents to the ED for evaluation of left lower quadrant pain for approximately 1 week.  Patient states that symptoms are very similar to previous episode of diverticulitis which was approximately 2 years ago.  She denies nausea, vomiting, diarrhea or blood in her stool.  She endorses constipation x2 days which is slightly abnormal for her.  She has had no treatment prior to arrival.  Denies fevers, chills.  Patient notes that she has been battling a UTI for the last month and has been on 3 different medications and just recently completed her most recent treatment.  Unknown medication.  HPI     Home Medications Prior to Admission medications   Medication Sig Start Date End Date Taking? Authorizing Provider  amoxicillin-clavulanate (AUGMENTIN) 875-125 MG tablet Take 1 tablet by mouth every 12 (twelve) hours. 07/21/21  Yes Kathe Becton R, PA-C  ondansetron (ZOFRAN) 4 MG tablet Take 1 tablet (4 mg total) by mouth every 6 (six) hours for 10 days. 07/21/21 07/31/21 Yes Kathe Becton R, PA-C  amoxicillin-clavulanate (AUGMENTIN) 875-125 MG tablet SMARTSIG:1 Tablet(s) By Mouth Every 12 Hours 10/02/19   [provider]  cephALEXin (KEFLEX) 500 MG capsule Take 1 capsule (500 mg total) by mouth 4 (four) times daily. 12/14/20   Blanchie Dessert, MD  cyclobenzaprine (FLEXERIL) 10 MG tablet Take 1 tablet (10 mg total) by mouth 2 (two) times daily as needed for muscle spasms. May make you sleepy so can take it only at night if needed 12/14/20   Blanchie Dessert, MD  diclofenac Sodium (VOLTAREN) 1 % GEL Apply 4 g topically 4 (four)  times daily. 12/14/20   Blanchie Dessert, MD  levothyroxine (SYNTHROID) 75 MCG tablet Take 1 tablet (75 mcg total) by mouth daily at 6 (six) AM. 09/01/19   Connye Burkitt, NP  lidocaine (LIDODERM) 5 % Place 1 patch onto the skin daily. Remove & Discard patch within 12 hours or as directed by MD 12/14/20   Blanchie Dessert, MD  LORazepam (ATIVAN) 1 MG tablet Take 1 tablet (1 mg total) by mouth at bedtime. 08/31/19   Connye Burkitt, NP  OLANZapine zydis (ZYPREXA) 15 MG disintegrating tablet Take 1 tablet (15 mg total) by mouth at bedtime. 08/31/19   Connye Burkitt, NP  ondansetron (ZOFRAN) 4 MG tablet Take 1 tablet (4 mg total) by mouth every 6 (six) hours. 12/14/20   Blanchie Dessert, MD  pravastatin (PRAVACHOL) 10 MG tablet Take 10 mg by mouth daily. 10/02/19   [provider]  tiZANidine (ZANAFLEX) 4 MG tablet Take 8-12 mg by mouth at bedtime. As needed 08/31/19   [provider]  zolpidem (AMBIEN) 5 MG tablet Take 1 tablet (5 mg total) by mouth at bedtime as needed for sleep. 08/31/19   Connye Burkitt, NP      Allergies    Erythromycin, Morphine and related, Promethazine, and Simvastatin    Review of Systems   Review of Systems  Physical Exam Updated Vital Signs BP (!) 155/85   Pulse 76   Temp 98.4 F (36.9 C) (Oral)   Resp 19   Ht '5\' 8"'$  (1.727 m)   Wt  81.6 kg   SpO2 99%   BMI 27.37 kg/m  Physical Exam Vitals and nursing note reviewed.  Constitutional:      General: She is not in acute distress.    Appearance: She is not ill-appearing.  HENT:     Head: Atraumatic.  Eyes:     Conjunctiva/sclera: Conjunctivae normal.  Cardiovascular:     Rate and Rhythm: Normal rate and regular rhythm.     Pulses: Normal pulses.     Heart sounds: No murmur heard. Pulmonary:     Effort: Pulmonary effort is normal. No respiratory distress.     Breath sounds: Normal breath sounds.  Abdominal:     General: Abdomen is flat. There is no distension.     Palpations: Abdomen is soft.      Tenderness: There is abdominal tenderness in the left lower quadrant. There is no right CVA tenderness or left CVA tenderness.     Comments: Abdomen is soft, nondistended.  She is tender to palpation of the left lower quadrant.  Negative CVA tenderness  Musculoskeletal:        General: Normal range of motion.     Cervical back: Normal range of motion.  Skin:    General: Skin is warm and dry.     Capillary Refill: Capillary refill takes less than 2 seconds.  Neurological:     General: No focal deficit present.     Mental Status: She is alert.  Psychiatric:        Mood and Affect: Mood normal.     ED Results / Procedures / Treatments   Labs (all labs ordered are listed, but only abnormal results are displayed) Labs Reviewed  COMPREHENSIVE METABOLIC PANEL - Abnormal; Notable for the following components:      Result Value   AST 14 (*)    All other components within normal limits  LIPASE, BLOOD  CBC  URINALYSIS, ROUTINE W REFLEX MICROSCOPIC    EKG None  Radiology CT ABDOMEN PELVIS W CONTRAST  Result Date: 07/21/2021 CLINICAL DATA:  Left lower quadrant pain EXAM: CT ABDOMEN AND PELVIS WITH CONTRAST TECHNIQUE: Multidetector CT imaging of the abdomen and pelvis was performed using the standard protocol following bolus administration of intravenous contrast. RADIATION DOSE REDUCTION: This exam was performed according to the departmental dose-optimization program which includes automated exposure control, adjustment of the mA and/or kV according to patient size and/or use of iterative reconstruction technique. CONTRAST:  123m OMNIPAQUE IOHEXOL 300 MG/ML  SOLN COMPARISON:  12/13/2020 CT abdomen pelvis without contrast FINDINGS: Lower chest: No focal pulmonary opacity or pleural effusion. No pericardial effusion. Hepatobiliary: No focal liver abnormality is seen. No gallstones, gallbladder wall thickening, or biliary dilatation. Pancreas: Unremarkable. No pancreatic ductal dilatation or  surrounding inflammatory changes. Spleen: Normal in size without focal abnormality. Adrenals/Urinary Tract: Normal left adrenal gland. Redemonstrated 11 mm right adrenal nodule, most likely an adrenal adenoma. Kidneys are normal, without renal calculi, focal lesion, or hydronephrosis. Bladder is unremarkable. Stomach/Bowel: Colonic diverticulosis with pericolonic stranding about the distal descending and proximal sigmoid colon in the left lower quadrant (series 2, image 67 and series 5, image 58). No focal fluid collection or free air to suggest perforation or abscess. The stomach is unremarkable. No evidence of bowel obstruction. Vascular/Lymphatic: Aortic atherosclerosis. No enlarged abdominal or pelvic lymph nodes. Reproductive: Status post hysterectomy. No adnexal masses. Other: No free fluid or free air. Musculoskeletal: No acute osseous abnormality. IMPRESSION: Uncomplicated diverticulitis in the distal descending colon and proximal sigmoid colon  in the left lower quadrant. No evidence of abscess or perforation. Electronically Signed   By: Merilyn Baba M.D.   On: 07/21/2021 14:48    Procedures Procedures    Medications Ordered in ED Medications  acetaminophen (TYLENOL) tablet 650 mg (650 mg Oral Given 07/21/21 1327)  lactated ringers bolus 1,000 mL (0 mLs Intravenous Stopped 07/21/21 1440)  iohexol (OMNIPAQUE) 300 MG/ML solution 100 mL (100 mLs Intravenous Contrast Given 07/21/21 1431)  morphine (PF) 4 MG/ML injection 4 mg (4 mg Intravenous Given 07/21/21 1543)  ondansetron (ZOFRAN) injection 4 mg (4 mg Intravenous Given 07/21/21 1543)    ED Course/ Medical Decision Making/ A&P                           Medical Decision Making Amount and/or Complexity of Data Reviewed Labs: ordered. Radiology: ordered.  Risk OTC drugs. Prescription drug management.   Social determinants of health:  Social History   Socioeconomic History   Marital status: Married    Spouse name: Araceli Bouche   Number of  children: 2   Years of education: Not on file   Highest education level: High school graduate  Occupational History    Comment: na  Tobacco Use   Smoking status: Former    Packs/day: 2.00    Years: 30.00    Total pack years: 60.00    Types: Cigarettes    Quit date: 06/23/2006    Years since quitting: 15.0   Smokeless tobacco: Never  Vaping Use   Vaping Use: Never used  Substance and Sexual Activity   Alcohol use: No   Drug use: Not Currently    Types: Marijuana, Other-see comments    Comment: CBD oil, last used 2020   Sexual activity: Not Currently  Other Topics Concern   Not on file  Social History Narrative   Lives with spouse   Caffeine-tea 3-4 weekly   Social Determinants of Health   Financial Resource Strain: Not on file  Food Insecurity: Not on file  Transportation Needs: Not on file  Physical Activity: Not on file  Stress: Not on file  Social Connections: Not on file  Intimate Partner Violence: Not on file     Initial impression:  This patient presents to the ED for concern of left lower quadrant pain, this involves an extensive number of treatment options, and is a complaint that carries with it a high risk of complications and morbidity.   Differentials include diverticulitis, gastroenteritis, constipation, UTI, obstruction.   Comorbidities affecting care:  Diabetes, hypothyroid  Additional history obtained: CT abdomen pelvis 09/29/2020 with changes of mild diverticulitis without abscess  Lab Tests  I Ordered, reviewed, and interpreted labs and EKG.  The pertinent results include:  No leukocytosis UA clear CMP and lipase normal  Imaging Studies ordered:  I ordered imaging studies including  CT abdomen shows mild diverticulitis without evidence of perforation or abscess I independently visualized and interpreted imaging and I agree with the radiologist interpretation.    Cardiac Monitoring:  The patient was maintained on a cardiac monitor.  I  personally viewed and interpreted the cardiac monitored which showed an underlying rhythm of: NSR   Medicines ordered and prescription drug management:  I ordered medication including: 1 L lactated ringer bolus Zofran 4 mg Morphine 4 mg Reevaluation of the patient after these medicines showed that the patient improved I have reviewed the patients home medicines and have made adjustments as needed  ED Course/Re-evaluation: 61 year old female presents to the ED for evaluation of left lower quadrant pain for the past week.  Vitals without significant abnormality.  On exam, she is overall well-appearing although appears slightly uncomfortable.  She has tenderness to the LLQ.  Abdomen is otherwise soft and nondistended.  Given her symptoms, concern for diverticulitis flareup versus constipation.  Patient given pain medications and 1 L of fluids.  Labs without leukocytosis.  No evidence of UTI.  CT abdomen shows mild diverticulitis without complication.  Given that she has no leukocytosis and no signs of abscess or perforation, patient can likely be managed outpatient.  Will discharge home with Augmentin twice daily x10 days.  Patient already has a gastroenterologist and is instructed to follow-up with them.  Patient has been expressed understanding amenable to plan.  Disposition:  After consideration of the diagnostic results, physical exam, history and the patients response to treatment feel that the patent would benefit from discharge.   Diverticulitis: Plan and management as described above. Discharged home in good condition.   Final Clinical Impression(s) / ED Diagnoses Final diagnoses:  Diverticulitis    Rx / DC Orders ED Discharge Orders          Ordered    amoxicillin-clavulanate (AUGMENTIN) 875-125 MG tablet  Every 12 hours        07/21/21 1538    ondansetron (ZOFRAN) 4 MG tablet  Every 6 hours        07/21/21 1538              Tonye Pearson, [place] 07/21/21  1605    Wyvonnia Dusky, MD 07/21/21 1709

## 2021-07-21 NOTE — ED Triage Notes (Signed)
Pt complains of abdominal pain related to diverticulitis for the past week. Pt denis N/V.

## 2021-07-21 NOTE — Discharge Instructions (Addendum)
Your CT shows a have mild diverticulitis without any abscess or perforation.  Your labs were also very reassuring and you did not have an elevated white count.  Your urine also looked good.  Given this, your diverticulitis can be managed outpatient with antibiotic therapy.  You will take 1 tablet twice daily for 10 days.  Please follow-up with your gastroenterologist.  If your symptoms worsen or you develop fevers, please return to the emergency department for evaluation.

## 2021-08-08 ENCOUNTER — Other Ambulatory Visit: Payer: Self-pay | Admitting: Gastroenterology

## 2021-08-08 ENCOUNTER — Ambulatory Visit
Admission: RE | Admit: 2021-08-08 | Discharge: 2021-08-08 | Disposition: A | Discharge status: Home / Self Care | Payer: BC Managed Care – PPO | Source: Ambulatory Visit | Attending: Gastroenterology | Admitting: Gastroenterology

## 2021-08-08 DIAGNOSIS — K5792 Diverticulitis of intestine, part unspecified, without perforation or abscess without bleeding: Secondary | ICD-10-CM

## 2021-08-08 DIAGNOSIS — K5732 Diverticulitis of large intestine without perforation or abscess without bleeding: Secondary | ICD-10-CM | POA: Diagnosis not present

## 2021-08-08 DIAGNOSIS — M5136 Other intervertebral disc degeneration, lumbar region: Secondary | ICD-10-CM | POA: Diagnosis not present

## 2021-08-08 DIAGNOSIS — K529 Noninfective gastroenteritis and colitis, unspecified: Secondary | ICD-10-CM | POA: Diagnosis not present

## 2021-08-08 DIAGNOSIS — I774 Celiac artery compression syndrome: Secondary | ICD-10-CM | POA: Diagnosis not present

## 2021-08-08 DIAGNOSIS — I700 Atherosclerosis of aorta: Secondary | ICD-10-CM | POA: Diagnosis not present

## 2021-08-08 MED ORDER — IOPAMIDOL (ISOVUE-300) INJECTION 61%
100.0000 mL | Freq: Once | INTRAVENOUS | Status: AC | PRN
  Administered 2021-08-08: 100 mL via INTRAVENOUS

## 2021-09-05 DIAGNOSIS — F411 Generalized anxiety disorder: Secondary | ICD-10-CM | POA: Diagnosis not present

## 2021-09-05 DIAGNOSIS — F5101 Primary insomnia: Secondary | ICD-10-CM | POA: Diagnosis not present

## 2021-10-23 DIAGNOSIS — I10 Essential (primary) hypertension: Secondary | ICD-10-CM | POA: Diagnosis not present

## 2021-10-23 DIAGNOSIS — E1122 Type 2 diabetes mellitus with diabetic chronic kidney disease: Secondary | ICD-10-CM | POA: Diagnosis not present

## 2021-10-23 DIAGNOSIS — Z5181 Encounter for therapeutic drug level monitoring: Secondary | ICD-10-CM | POA: Diagnosis not present

## 2021-10-23 DIAGNOSIS — E785 Hyperlipidemia, unspecified: Secondary | ICD-10-CM | POA: Diagnosis not present

## 2021-10-23 DIAGNOSIS — N1831 Chronic kidney disease, stage 3a: Secondary | ICD-10-CM | POA: Diagnosis not present

## 2021-10-27 DIAGNOSIS — Z6828 Body mass index (BMI) 28.0-28.9, adult: Secondary | ICD-10-CM | POA: Diagnosis not present

## 2021-10-27 DIAGNOSIS — M461 Sacroiliitis, not elsewhere classified: Secondary | ICD-10-CM | POA: Diagnosis not present

## 2021-11-07 DIAGNOSIS — M461 Sacroiliitis, not elsewhere classified: Secondary | ICD-10-CM | POA: Diagnosis not present

## 2021-12-15 ENCOUNTER — Telehealth: Payer: Self-pay | Admitting: *Deleted

## 2021-12-15 ENCOUNTER — Encounter: Payer: Self-pay | Admitting: *Deleted

## 2021-12-15 NOTE — Patient Instructions (Signed)
Visit Information  Thank you for taking time to visit with me today. Please don't hesitate to contact me if I can be of assistance to you.   Following are the goals we discussed today:   Goals Addressed               This Visit's Progress     COMPLETED: No needs (pt-stated)        Care Coordination Interventions: Reviewed medications with patient and discussed Adherence with all medications with no needed refills Reviewed scheduled/upcoming provider appointments including pending appointments with sufficient transportation Screening for signs and symptoms of depression related to chronic disease state  Assessed social determinant of health barriers          Please call the care guide team at [phone] if you need to cancel or reschedule your appointment.   If you are experiencing a Mental Health or [place] or need someone to talk to, please call the Suicide and Crisis Lifeline: 988  Patient verbalizes understanding of instructions and care plan provided today and agrees to view in [place]. Active MyChart status and patient understanding of how to access instructions and care plan via MyChart confirmed with patient.      No follow up needs   Raina Mina, RN Care Management Coordinator [place] Office [phone]

## 2021-12-15 NOTE — Patient Outreach (Signed)
  Care Coordination   Initial Visit Note   12/15/2021 Name: Kathleen Sweeney MRN: [id number] DOB: [birth year]  Kathleen Sweeney is a 61 y.o. year old female who sees Lindell Noe, Anastasia Pall, MD for primary care. I spoke with  Kathleen Sweeney by phone today.  What matters to the patients health and wellness today?  No needs    Goals Addressed               This Visit's Progress     COMPLETED: No needs (pt-stated)        Care Coordination Interventions: Reviewed medications with patient and discussed Adherence with all medications with no needed refills Reviewed scheduled/upcoming provider appointments including pending appointments with sufficient transportation Screening for signs and symptoms of depression related to chronic disease state  Assessed social determinant of health barriers          SDOH assessments and interventions completed:  Yes  SDOH Interventions Today    Flowsheet Row Most Recent Value  SDOH Interventions   Food Insecurity Interventions Intervention Not Indicated  Housing Interventions Intervention Not Indicated  Transportation Interventions Intervention Not Indicated  Utilities Interventions Intervention Not Indicated        Care Coordination Interventions Activated:  Yes  Care Coordination Interventions:  Yes, provided   Follow up plan: No further intervention required.   Encounter Outcome:  Pt. Visit Completed   Raina Mina, RN Care Management Coordinator [place] Office [phone]

## 2022-02-21 DIAGNOSIS — E1169 Type 2 diabetes mellitus with other specified complication: Secondary | ICD-10-CM | POA: Diagnosis not present

## 2022-02-21 DIAGNOSIS — Z0000 Encounter for general adult medical examination without abnormal findings: Secondary | ICD-10-CM | POA: Diagnosis not present

## 2022-02-21 DIAGNOSIS — Z23 Encounter for immunization: Secondary | ICD-10-CM | POA: Diagnosis not present

## 2022-02-21 DIAGNOSIS — E785 Hyperlipidemia, unspecified: Secondary | ICD-10-CM | POA: Diagnosis not present

## 2022-02-21 DIAGNOSIS — E039 Hypothyroidism, unspecified: Secondary | ICD-10-CM | POA: Diagnosis not present

## 2022-02-21 DIAGNOSIS — R8290 Unspecified abnormal findings in urine: Secondary | ICD-10-CM | POA: Diagnosis not present

## 2022-02-21 DIAGNOSIS — R531 Weakness: Secondary | ICD-10-CM | POA: Diagnosis not present

## 2022-02-21 DIAGNOSIS — E559 Vitamin D deficiency, unspecified: Secondary | ICD-10-CM | POA: Diagnosis not present

## 2022-03-01 DIAGNOSIS — L821 Other seborrheic keratosis: Secondary | ICD-10-CM | POA: Diagnosis not present

## 2022-03-01 DIAGNOSIS — L218 Other seborrheic dermatitis: Secondary | ICD-10-CM | POA: Diagnosis not present

## 2022-03-01 DIAGNOSIS — D225 Melanocytic nevi of trunk: Secondary | ICD-10-CM | POA: Diagnosis not present

## 2022-03-01 DIAGNOSIS — L820 Inflamed seborrheic keratosis: Secondary | ICD-10-CM | POA: Diagnosis not present

## 2022-03-07 DIAGNOSIS — N390 Urinary tract infection, site not specified: Secondary | ICD-10-CM | POA: Diagnosis not present

## 2022-03-07 DIAGNOSIS — R635 Abnormal weight gain: Secondary | ICD-10-CM | POA: Diagnosis not present

## 2022-03-07 DIAGNOSIS — R232 Flushing: Secondary | ICD-10-CM | POA: Diagnosis not present

## 2022-03-14 DIAGNOSIS — E669 Obesity, unspecified: Secondary | ICD-10-CM | POA: Diagnosis not present

## 2022-03-14 DIAGNOSIS — R232 Flushing: Secondary | ICD-10-CM | POA: Diagnosis not present

## 2022-03-14 DIAGNOSIS — G4733 Obstructive sleep apnea (adult) (pediatric): Secondary | ICD-10-CM | POA: Diagnosis not present

## 2022-03-14 DIAGNOSIS — I10 Essential (primary) hypertension: Secondary | ICD-10-CM | POA: Diagnosis not present

## 2022-03-14 DIAGNOSIS — E039 Hypothyroidism, unspecified: Secondary | ICD-10-CM | POA: Diagnosis not present

## 2022-03-14 DIAGNOSIS — R635 Abnormal weight gain: Secondary | ICD-10-CM | POA: Diagnosis not present

## 2022-04-04 DIAGNOSIS — G4733 Obstructive sleep apnea (adult) (pediatric): Secondary | ICD-10-CM | POA: Diagnosis not present

## 2022-04-06 DIAGNOSIS — G4733 Obstructive sleep apnea (adult) (pediatric): Secondary | ICD-10-CM | POA: Diagnosis not present

## 2022-04-06 DIAGNOSIS — E669 Obesity, unspecified: Secondary | ICD-10-CM | POA: Diagnosis not present

## 2022-04-25 ENCOUNTER — Other Ambulatory Visit: Payer: Self-pay | Admitting: Family Medicine

## 2022-04-25 DIAGNOSIS — Z1231 Encounter for screening mammogram for malignant neoplasm of breast: Secondary | ICD-10-CM

## 2022-05-07 ENCOUNTER — Other Ambulatory Visit (HOSPITAL_COMMUNITY): Payer: Self-pay

## 2022-05-07 DIAGNOSIS — E1169 Type 2 diabetes mellitus with other specified complication: Secondary | ICD-10-CM | POA: Diagnosis not present

## 2022-05-07 DIAGNOSIS — I10 Essential (primary) hypertension: Secondary | ICD-10-CM | POA: Diagnosis not present

## 2022-05-07 DIAGNOSIS — E785 Hyperlipidemia, unspecified: Secondary | ICD-10-CM | POA: Diagnosis not present

## 2022-05-07 DIAGNOSIS — N390 Urinary tract infection, site not specified: Secondary | ICD-10-CM | POA: Diagnosis not present

## 2022-05-08 ENCOUNTER — Other Ambulatory Visit (HOSPITAL_COMMUNITY): Payer: Self-pay

## 2022-05-08 MED ORDER — OZEMPIC (0.25 OR 0.5 MG/DOSE) 2 MG/3ML ~~LOC~~ SOPN
PEN_INJECTOR | SUBCUTANEOUS | 0 refills | Status: AC
  Filled 2022-05-08: qty 3, 42d supply, fill #0

## 2022-05-09 DIAGNOSIS — Z6829 Body mass index (BMI) 29.0-29.9, adult: Secondary | ICD-10-CM | POA: Diagnosis not present

## 2022-05-09 DIAGNOSIS — M47818 Spondylosis without myelopathy or radiculopathy, sacral and sacrococcygeal region: Secondary | ICD-10-CM | POA: Diagnosis not present

## 2022-05-10 DIAGNOSIS — G4733 Obstructive sleep apnea (adult) (pediatric): Secondary | ICD-10-CM | POA: Diagnosis not present

## 2022-05-11 ENCOUNTER — Other Ambulatory Visit (HOSPITAL_COMMUNITY): Payer: Self-pay

## 2022-05-17 DIAGNOSIS — E669 Obesity, unspecified: Secondary | ICD-10-CM | POA: Diagnosis not present

## 2022-05-17 DIAGNOSIS — M47818 Spondylosis without myelopathy or radiculopathy, sacral and sacrococcygeal region: Secondary | ICD-10-CM | POA: Diagnosis not present

## 2022-05-17 DIAGNOSIS — E1169 Type 2 diabetes mellitus with other specified complication: Secondary | ICD-10-CM | POA: Diagnosis not present

## 2022-05-25 DIAGNOSIS — G4733 Obstructive sleep apnea (adult) (pediatric): Secondary | ICD-10-CM | POA: Diagnosis not present

## 2022-06-07 ENCOUNTER — Ambulatory Visit
Admission: RE | Admit: 2022-06-07 | Discharge: 2022-06-07 | Disposition: A | Discharge status: Home / Self Care | Payer: BC Managed Care – PPO | Source: Ambulatory Visit | Attending: Family Medicine | Admitting: Family Medicine

## 2022-06-07 DIAGNOSIS — Z1231 Encounter for screening mammogram for malignant neoplasm of breast: Secondary | ICD-10-CM | POA: Diagnosis not present

## 2022-06-10 DIAGNOSIS — G4733 Obstructive sleep apnea (adult) (pediatric): Secondary | ICD-10-CM | POA: Diagnosis not present

## 2022-06-15 DIAGNOSIS — E1169 Type 2 diabetes mellitus with other specified complication: Secondary | ICD-10-CM | POA: Diagnosis not present

## 2022-06-15 DIAGNOSIS — I10 Essential (primary) hypertension: Secondary | ICD-10-CM | POA: Diagnosis not present

## 2022-06-15 DIAGNOSIS — R001 Bradycardia, unspecified: Secondary | ICD-10-CM | POA: Diagnosis not present

## 2022-06-15 DIAGNOSIS — I959 Hypotension, unspecified: Secondary | ICD-10-CM | POA: Diagnosis not present

## 2022-06-21 DIAGNOSIS — E1169 Type 2 diabetes mellitus with other specified complication: Secondary | ICD-10-CM | POA: Diagnosis not present

## 2022-06-21 DIAGNOSIS — I10 Essential (primary) hypertension: Secondary | ICD-10-CM | POA: Diagnosis not present

## 2022-06-28 DIAGNOSIS — H02831 Dermatochalasis of right upper eyelid: Secondary | ICD-10-CM | POA: Diagnosis not present

## 2022-06-28 DIAGNOSIS — E119 Type 2 diabetes mellitus without complications: Secondary | ICD-10-CM | POA: Diagnosis not present

## 2022-06-28 DIAGNOSIS — H02834 Dermatochalasis of left upper eyelid: Secondary | ICD-10-CM | POA: Diagnosis not present

## 2022-06-28 DIAGNOSIS — H1820 Unspecified corneal edema: Secondary | ICD-10-CM | POA: Diagnosis not present

## 2022-06-28 DIAGNOSIS — H52203 Unspecified astigmatism, bilateral: Secondary | ICD-10-CM | POA: Diagnosis not present

## 2022-07-10 DIAGNOSIS — G4733 Obstructive sleep apnea (adult) (pediatric): Secondary | ICD-10-CM | POA: Diagnosis not present

## 2022-07-19 DIAGNOSIS — F5101 Primary insomnia: Secondary | ICD-10-CM | POA: Diagnosis not present

## 2022-07-19 DIAGNOSIS — F411 Generalized anxiety disorder: Secondary | ICD-10-CM | POA: Diagnosis not present

## 2022-07-26 DIAGNOSIS — I10 Essential (primary) hypertension: Secondary | ICD-10-CM | POA: Diagnosis not present

## 2022-07-26 DIAGNOSIS — E039 Hypothyroidism, unspecified: Secondary | ICD-10-CM | POA: Diagnosis not present

## 2022-07-26 DIAGNOSIS — F5104 Psychophysiologic insomnia: Secondary | ICD-10-CM | POA: Diagnosis not present

## 2022-07-26 DIAGNOSIS — G4733 Obstructive sleep apnea (adult) (pediatric): Secondary | ICD-10-CM | POA: Diagnosis not present

## 2022-08-10 DIAGNOSIS — G4733 Obstructive sleep apnea (adult) (pediatric): Secondary | ICD-10-CM | POA: Diagnosis not present

## 2022-08-27 DIAGNOSIS — R399 Unspecified symptoms and signs involving the genitourinary system: Secondary | ICD-10-CM | POA: Diagnosis not present

## 2022-08-27 DIAGNOSIS — N898 Other specified noninflammatory disorders of vagina: Secondary | ICD-10-CM | POA: Diagnosis not present

## 2022-08-27 DIAGNOSIS — R1030 Lower abdominal pain, unspecified: Secondary | ICD-10-CM | POA: Diagnosis not present

## 2022-08-27 DIAGNOSIS — N644 Mastodynia: Secondary | ICD-10-CM | POA: Diagnosis not present

## 2022-08-27 DIAGNOSIS — E1169 Type 2 diabetes mellitus with other specified complication: Secondary | ICD-10-CM | POA: Diagnosis not present

## 2022-08-27 DIAGNOSIS — I10 Essential (primary) hypertension: Secondary | ICD-10-CM | POA: Diagnosis not present

## 2022-09-05 ENCOUNTER — Other Ambulatory Visit: Payer: Self-pay | Admitting: Family Medicine

## 2022-09-05 DIAGNOSIS — N644 Mastodynia: Secondary | ICD-10-CM

## 2022-09-24 DIAGNOSIS — M47818 Spondylosis without myelopathy or radiculopathy, sacral and sacrococcygeal region: Secondary | ICD-10-CM | POA: Diagnosis not present

## 2022-10-09 ENCOUNTER — Ambulatory Visit
Admission: RE | Admit: 2022-10-09 | Discharge: 2022-10-09 | Disposition: A | Discharge status: Home / Self Care | Payer: BC Managed Care – PPO | Source: Ambulatory Visit | Attending: Family Medicine | Admitting: Family Medicine

## 2022-10-09 DIAGNOSIS — N644 Mastodynia: Secondary | ICD-10-CM

## 2022-11-05 DIAGNOSIS — Z6826 Body mass index (BMI) 26.0-26.9, adult: Secondary | ICD-10-CM | POA: Diagnosis not present

## 2022-11-05 DIAGNOSIS — M47818 Spondylosis without myelopathy or radiculopathy, sacral and sacrococcygeal region: Secondary | ICD-10-CM | POA: Diagnosis not present

## 2022-11-16 ENCOUNTER — Other Ambulatory Visit: Payer: Self-pay | Admitting: Neurological Surgery

## 2022-11-16 DIAGNOSIS — M47818 Spondylosis without myelopathy or radiculopathy, sacral and sacrococcygeal region: Secondary | ICD-10-CM

## 2022-11-27 DIAGNOSIS — R109 Unspecified abdominal pain: Secondary | ICD-10-CM | POA: Diagnosis not present

## 2022-11-27 DIAGNOSIS — E1169 Type 2 diabetes mellitus with other specified complication: Secondary | ICD-10-CM | POA: Diagnosis not present

## 2022-11-27 DIAGNOSIS — R110 Nausea: Secondary | ICD-10-CM | POA: Diagnosis not present

## 2022-11-27 DIAGNOSIS — G4733 Obstructive sleep apnea (adult) (pediatric): Secondary | ICD-10-CM | POA: Diagnosis not present

## 2022-12-27 DIAGNOSIS — K5732 Diverticulitis of large intestine without perforation or abscess without bleeding: Secondary | ICD-10-CM | POA: Diagnosis not present

## 2022-12-27 DIAGNOSIS — E1169 Type 2 diabetes mellitus with other specified complication: Secondary | ICD-10-CM | POA: Diagnosis not present

## 2022-12-27 DIAGNOSIS — I10 Essential (primary) hypertension: Secondary | ICD-10-CM | POA: Diagnosis not present

## 2022-12-27 DIAGNOSIS — Z23 Encounter for immunization: Secondary | ICD-10-CM | POA: Diagnosis not present

## 2023-01-17 DIAGNOSIS — F5101 Primary insomnia: Secondary | ICD-10-CM | POA: Diagnosis not present

## 2023-01-17 DIAGNOSIS — F411 Generalized anxiety disorder: Secondary | ICD-10-CM | POA: Diagnosis not present

## 2023-04-01 DIAGNOSIS — Z0000 Encounter for general adult medical examination without abnormal findings: Secondary | ICD-10-CM | POA: Diagnosis not present

## 2023-04-01 DIAGNOSIS — E039 Hypothyroidism, unspecified: Secondary | ICD-10-CM | POA: Diagnosis not present

## 2023-04-01 DIAGNOSIS — E785 Hyperlipidemia, unspecified: Secondary | ICD-10-CM | POA: Diagnosis not present

## 2023-04-01 DIAGNOSIS — E1169 Type 2 diabetes mellitus with other specified complication: Secondary | ICD-10-CM | POA: Diagnosis not present

## 2023-04-01 DIAGNOSIS — I10 Essential (primary) hypertension: Secondary | ICD-10-CM | POA: Diagnosis not present

## 2023-04-01 DIAGNOSIS — E559 Vitamin D deficiency, unspecified: Secondary | ICD-10-CM | POA: Diagnosis not present

## 2023-04-09 DIAGNOSIS — N1831 Chronic kidney disease, stage 3a: Secondary | ICD-10-CM | POA: Diagnosis not present

## 2023-04-09 DIAGNOSIS — I10 Essential (primary) hypertension: Secondary | ICD-10-CM | POA: Diagnosis not present

## 2023-04-11 ENCOUNTER — Other Ambulatory Visit: Payer: Self-pay | Admitting: Podiatry

## 2023-04-11 ENCOUNTER — Encounter: Payer: Self-pay | Admitting: Podiatry

## 2023-04-11 ENCOUNTER — Ambulatory Visit (INDEPENDENT_AMBULATORY_CARE_PROVIDER_SITE_OTHER): Payer: BC Managed Care – PPO

## 2023-04-11 ENCOUNTER — Ambulatory Visit: Payer: BC Managed Care – PPO | Admitting: Podiatry

## 2023-04-11 DIAGNOSIS — M51369 Other intervertebral disc degeneration, lumbar region without mention of lumbar back pain or lower extremity pain: Secondary | ICD-10-CM | POA: Insufficient documentation

## 2023-04-11 DIAGNOSIS — M47816 Spondylosis without myelopathy or radiculopathy, lumbar region: Secondary | ICD-10-CM | POA: Insufficient documentation

## 2023-04-11 DIAGNOSIS — M7751 Other enthesopathy of right foot: Secondary | ICD-10-CM

## 2023-04-11 DIAGNOSIS — M7752 Other enthesopathy of left foot: Secondary | ICD-10-CM

## 2023-04-11 DIAGNOSIS — M2041 Other hammer toe(s) (acquired), right foot: Secondary | ICD-10-CM

## 2023-04-11 DIAGNOSIS — G5781 Other specified mononeuropathies of right lower limb: Secondary | ICD-10-CM | POA: Diagnosis not present

## 2023-04-11 DIAGNOSIS — M461 Sacroiliitis, not elsewhere classified: Secondary | ICD-10-CM | POA: Insufficient documentation

## 2023-04-11 MED ORDER — TRIAMCINOLONE ACETONIDE 40 MG/ML IJ SUSP
20.0000 mg | Freq: Once | INTRAMUSCULAR | Status: AC
  Administered 2023-04-11: 20 mg

## 2023-04-11 NOTE — Progress Notes (Signed)
 Subjective:  Patient ID: Kathleen Sweeney, female    DOB: 03/04/1960,  MRN: [id number] HPI Chief Complaint  Patient presents with   Toe Pain    2nd, 3rd, 4th toes right - aching x several years, noticed its worsened more when she is walking, diabetic - last 6.7, PCP thinks could be coming from diabetes, she also get sharp, shooting pains intermittent through both big toes, right over left, cold sensations - sleeps with heating pad on toes   New Patient (Initial Visit)    63 y.o. female presents with the above complaint.   ROS: Denies fever chills nausea vomiting muscle aches pains calf pain back pain chest pain shortness of breath.  Past Medical History:  Diagnosis Date   CKD (chronic kidney disease)    Depression    Facial droop    Fibromyalgia    Hyperlipidemia    Hypertension    Hypothyroid    IBS (irritable bowel syndrome)    Insomnia    Lupus    Migraine    RLS (restless legs syndrome)    Skin cancer Had cancer removed in 2000   Thyroid disease    Past Surgical History:  Procedure Laterality Date   ABDOMINAL HYSTERECTOMY  2008   APPENDECTOMY  1998   thoracic tumor  03/1995   excision    Current Outpatient Medications:    levothyroxine (SYNTHROID) 75 MCG tablet, Take 1 tablet (75 mcg total) by mouth daily at 6 (six) AM., Disp: 30 tablet, Rfl: 0   losartan (COZAAR) 25 MG tablet, Take 25 mg by mouth daily., Disp: , Rfl:    OZEMPIC, 0.25 OR 0.5 MG/DOSE, 2 MG/3ML SOPN, INJECT 0.25MG  FOR 4 WEEKS, FOLLOWED BY 0.5MG  SUBCUTANEOUS ONCE A WEEK 60 DAYS, Disp: , Rfl:    pravastatin (PRAVACHOL) 10 MG tablet, Take 10 mg by mouth daily., Disp: , Rfl:    QUVIVIQ 50 MG TABS, Take 1 tablet by mouth at bedtime., Disp: , Rfl:    tiZANidine (ZANAFLEX) 4 MG tablet, Take 8-12 mg by mouth at bedtime. As needed, Disp: , Rfl:   Allergies  Allergen Reactions   Erythromycin Anaphylaxis    Chest pain   Morphine And Codeine Nausea And Vomiting    Not given   Promethazine Nausea And  Vomiting    "made her sick"   Simvastatin Other (See Comments)    Muscle aches   Review of Systems Objective:  There were no vitals filed for this visit.  General: Well developed, nourished, in no acute distress, alert and oriented x3   Dermatological: Skin is warm, dry and supple bilateral. Nails x 10 are well maintained; remaining integument appears unremarkable at this time. There are no open sores, no preulcerative lesions, no rash or signs of infection present.  Vascular: Dorsalis Pedis artery and Posterior Tibial artery pedal pulses are 2/4 bilateral with immedate capillary fill time. Pedal hair growth present. No varicosities and no lower extremity edema present bilateral.   Neruologic: Grossly intact via light touch bilateral. Vibratory intact via tuning fork bilateral. Protective threshold with Semmes Wienstein monofilament intact to all pedal sites bilateral. Patellar and Achilles deep tendon reflexes 2+ bilateral. No Babinski or clonus noted bilateral.  Palpable Mulder's click third interdigital space of the right foot.  Musculoskeletal: No gross boney pedal deformities bilateral. No pain, crepitus, or limitation noted with foot and ankle range of motion bilateral. Muscular strength 5/5 in all groups tested bilateral.  Gait: Unassisted, Nonantalgic.    Radiographs:  Radiographs do  not demonstrate any type of osseous abnormalities in the forefoot other than some osteoarthritic change in the midfoot and possibly some to the toes.  No acute findings noted.  Assessment & Plan:   Assessment: Neuroma third interdigital space right foot.  Plan: Injected 10 mg of Kenalog to the third interspace of the right foot.  Will follow-up with her on an as-needed basis discussed appropriate shoe gear.     Symphonie Schneiderman T. [place], [place]

## 2023-05-07 ENCOUNTER — Ambulatory Visit: Admitting: Podiatry

## 2023-05-07 DIAGNOSIS — G5781 Other specified mononeuropathies of right lower limb: Secondary | ICD-10-CM

## 2023-05-07 MED ORDER — TRIAMCINOLONE ACETONIDE 40 MG/ML IJ SUSP
20.0000 mg | Freq: Once | INTRAMUSCULAR | Status: AC
  Administered 2023-05-07: 20 mg

## 2023-05-08 NOTE — Progress Notes (Signed)
 She presents today states that the right third toe is painful again she thinks that is starting to flareup again with the neuroma.  I would like to have another injection.  Objective: Vital signs are stable alert oriented x 3.  Pulses are palpable.  There is no erythema edema cellulitis drainage or odor though she does have pain on palpation with a palpable Mulder's click third interspace of the right foot.  Assessment: Neuroma third interspace right foot.  Plan: Reinjected today with Kenalog and local anesthetic.  Discussed the use of dehydrated alcohol and surgery.

## 2023-06-04 DIAGNOSIS — E039 Hypothyroidism, unspecified: Secondary | ICD-10-CM | POA: Diagnosis not present

## 2023-06-06 DIAGNOSIS — F5101 Primary insomnia: Secondary | ICD-10-CM | POA: Diagnosis not present

## 2023-06-06 DIAGNOSIS — F411 Generalized anxiety disorder: Secondary | ICD-10-CM | POA: Diagnosis not present

## 2023-07-16 DIAGNOSIS — R1084 Generalized abdominal pain: Secondary | ICD-10-CM | POA: Diagnosis not present

## 2023-07-16 DIAGNOSIS — R197 Diarrhea, unspecified: Secondary | ICD-10-CM | POA: Diagnosis not present

## 2023-07-16 DIAGNOSIS — R110 Nausea: Secondary | ICD-10-CM | POA: Diagnosis not present

## 2023-07-25 ENCOUNTER — Other Ambulatory Visit: Payer: Self-pay | Admitting: Family Medicine

## 2023-07-25 DIAGNOSIS — R109 Unspecified abdominal pain: Secondary | ICD-10-CM

## 2023-07-25 DIAGNOSIS — R197 Diarrhea, unspecified: Secondary | ICD-10-CM | POA: Diagnosis not present

## 2023-07-25 DIAGNOSIS — R1030 Lower abdominal pain, unspecified: Secondary | ICD-10-CM | POA: Diagnosis not present

## 2023-07-26 ENCOUNTER — Ambulatory Visit
Admission: RE | Admit: 2023-07-26 | Discharge: 2023-07-26 | Disposition: A | Discharge status: Home / Self Care | Source: Ambulatory Visit | Attending: Family Medicine | Admitting: Family Medicine

## 2023-07-26 DIAGNOSIS — R197 Diarrhea, unspecified: Secondary | ICD-10-CM | POA: Diagnosis not present

## 2023-07-26 DIAGNOSIS — R109 Unspecified abdominal pain: Secondary | ICD-10-CM

## 2023-07-26 DIAGNOSIS — K5730 Diverticulosis of large intestine without perforation or abscess without bleeding: Secondary | ICD-10-CM | POA: Diagnosis not present

## 2023-07-26 MED ORDER — IOPAMIDOL (ISOVUE-300) INJECTION 61%
100.0000 mL | Freq: Once | INTRAVENOUS | Status: AC | PRN
  Administered 2023-07-26: 100 mL via INTRAVENOUS

## 2023-08-08 DIAGNOSIS — H52203 Unspecified astigmatism, bilateral: Secondary | ICD-10-CM | POA: Diagnosis not present

## 2023-08-08 DIAGNOSIS — E119 Type 2 diabetes mellitus without complications: Secondary | ICD-10-CM | POA: Diagnosis not present
